# Patient Record
Sex: Female | Born: 1950
Health system: Southern US, Community
[De-identification: ages and names within clinical notes are randomized; demographics above are authoritative.]

## PROBLEM LIST (undated history)

## (undated) DIAGNOSIS — F329 Major depressive disorder, single episode, unspecified: Secondary | ICD-10-CM

## (undated) DIAGNOSIS — F32A Depression, unspecified: Secondary | ICD-10-CM

## (undated) DIAGNOSIS — F988 Other specified behavioral and emotional disorders with onset usually occurring in childhood and adolescence: Secondary | ICD-10-CM

## (undated) DIAGNOSIS — C4492 Squamous cell carcinoma of skin, unspecified: Secondary | ICD-10-CM

## (undated) DIAGNOSIS — Z8719 Personal history of other diseases of the digestive system: Secondary | ICD-10-CM

## (undated) DIAGNOSIS — F419 Anxiety disorder, unspecified: Secondary | ICD-10-CM

## (undated) DIAGNOSIS — K219 Gastro-esophageal reflux disease without esophagitis: Secondary | ICD-10-CM

## (undated) DIAGNOSIS — A64 Unspecified sexually transmitted disease: Secondary | ICD-10-CM

## (undated) DIAGNOSIS — K5790 Diverticulosis of intestine, part unspecified, without perforation or abscess without bleeding: Secondary | ICD-10-CM

## (undated) DIAGNOSIS — M1990 Unspecified osteoarthritis, unspecified site: Secondary | ICD-10-CM

## (undated) DIAGNOSIS — C801 Malignant (primary) neoplasm, unspecified: Secondary | ICD-10-CM

## (undated) DIAGNOSIS — F39 Unspecified mood [affective] disorder: Secondary | ICD-10-CM

## (undated) DIAGNOSIS — I10 Essential (primary) hypertension: Secondary | ICD-10-CM

## (undated) HISTORY — PX: PARTIAL COLECTOMY: SHX5273

## (undated) HISTORY — PX: PILONIDAL CYST EXCISION: SHX744

## (undated) HISTORY — PX: HEMORRHOID SURGERY: SHX153

## (undated) HISTORY — DX: Squamous cell carcinoma of skin, unspecified: C44.92

## (undated) HISTORY — PX: PLACEMENT OF BREAST IMPLANTS: SHX6334

## (undated) HISTORY — DX: Major depressive disorder, single episode, unspecified: F32.9

## (undated) HISTORY — DX: Depression, unspecified: F32.A

## (undated) HISTORY — PX: PARTIAL HYSTERECTOMY: SHX80

## (undated) HISTORY — DX: Unspecified osteoarthritis, unspecified site: M19.90

## (undated) HISTORY — PX: ABDOMINAL HYSTERECTOMY: SHX81

## (undated) HISTORY — DX: Diverticulosis of intestine, part unspecified, without perforation or abscess without bleeding: K57.90

## (undated) HISTORY — DX: Anxiety disorder, unspecified: F41.9

## (undated) HISTORY — DX: Gastro-esophageal reflux disease without esophagitis: K21.9

## (undated) HISTORY — PX: SHOULDER SURGERY: SHX246

## (undated) HISTORY — PX: RHINOPLASTY: SUR1284

## (undated) HISTORY — DX: Other specified behavioral and emotional disorders with onset usually occurring in childhood and adolescence: F98.8

## (undated) HISTORY — PX: COLON SURGERY: SHX602

## (undated) HISTORY — PX: BREAST IMPLANT REMOVAL: SUR1101

## (undated) HISTORY — DX: Unspecified sexually transmitted disease: A64

## (undated) HISTORY — DX: Unspecified mood (affective) disorder: F39

---

## 1999-08-11 ENCOUNTER — Encounter: Admission: RE | Admit: 1999-08-11 | Discharge: 1999-08-11 | Payer: Self-pay | Admitting: Family Medicine

## 1999-08-11 ENCOUNTER — Encounter: Payer: Self-pay | Admitting: Family Medicine

## 2000-01-21 ENCOUNTER — Encounter: Payer: Self-pay | Admitting: Orthopedic Surgery

## 2000-01-26 ENCOUNTER — Inpatient Hospital Stay (HOSPITAL_COMMUNITY): Admission: EM | Admit: 2000-01-26 | Discharge: 2000-01-27 | Payer: Self-pay | Admitting: Orthopedic Surgery

## 2001-04-24 ENCOUNTER — Encounter: Payer: Self-pay | Admitting: Family Medicine

## 2001-04-24 ENCOUNTER — Encounter: Admission: RE | Admit: 2001-04-24 | Discharge: 2001-04-24 | Payer: Self-pay | Admitting: Family Medicine

## 2002-04-13 ENCOUNTER — Encounter: Payer: Self-pay | Admitting: Family Medicine

## 2002-04-13 ENCOUNTER — Encounter: Admission: RE | Admit: 2002-04-13 | Discharge: 2002-04-13 | Payer: Self-pay | Admitting: Family Medicine

## 2002-08-15 ENCOUNTER — Encounter: Payer: Self-pay | Admitting: Family Medicine

## 2002-08-15 ENCOUNTER — Encounter: Admission: RE | Admit: 2002-08-15 | Discharge: 2002-08-15 | Payer: Self-pay | Admitting: Family Medicine

## 2004-04-21 ENCOUNTER — Ambulatory Visit: Payer: Self-pay | Admitting: Family Medicine

## 2004-05-07 ENCOUNTER — Ambulatory Visit: Payer: Self-pay | Admitting: Family Medicine

## 2004-07-08 ENCOUNTER — Ambulatory Visit: Payer: Self-pay | Admitting: Family Medicine

## 2004-09-18 ENCOUNTER — Ambulatory Visit: Payer: Self-pay | Admitting: Family Medicine

## 2004-10-26 ENCOUNTER — Ambulatory Visit: Payer: Self-pay | Admitting: Family Medicine

## 2005-04-27 ENCOUNTER — Encounter: Admission: RE | Admit: 2005-04-27 | Discharge: 2005-04-27 | Payer: Self-pay | Admitting: Orthopedic Surgery

## 2005-05-11 ENCOUNTER — Ambulatory Visit: Payer: Self-pay | Admitting: Family Medicine

## 2005-07-12 ENCOUNTER — Ambulatory Visit: Payer: Self-pay | Admitting: Internal Medicine

## 2005-07-15 ENCOUNTER — Ambulatory Visit: Payer: Self-pay | Admitting: Internal Medicine

## 2005-08-03 ENCOUNTER — Ambulatory Visit: Payer: Self-pay | Admitting: Family Medicine

## 2005-08-06 ENCOUNTER — Ambulatory Visit: Payer: Self-pay | Admitting: Family Medicine

## 2005-09-08 ENCOUNTER — Encounter: Admission: RE | Admit: 2005-09-08 | Discharge: 2005-09-08 | Payer: Self-pay | Admitting: Family Medicine

## 2005-10-05 ENCOUNTER — Ambulatory Visit: Payer: Self-pay | Admitting: Family Medicine

## 2005-10-28 ENCOUNTER — Ambulatory Visit: Payer: Self-pay | Admitting: Family Medicine

## 2006-02-07 ENCOUNTER — Ambulatory Visit: Payer: Self-pay | Admitting: Family Medicine

## 2006-02-08 ENCOUNTER — Encounter: Admission: RE | Admit: 2006-02-08 | Discharge: 2006-02-08 | Payer: Self-pay | Admitting: Family Medicine

## 2006-07-14 ENCOUNTER — Encounter: Payer: Self-pay | Admitting: Family Medicine

## 2006-07-14 DIAGNOSIS — I10 Essential (primary) hypertension: Secondary | ICD-10-CM

## 2006-07-14 DIAGNOSIS — K5730 Diverticulosis of large intestine without perforation or abscess without bleeding: Secondary | ICD-10-CM | POA: Insufficient documentation

## 2006-08-04 ENCOUNTER — Ambulatory Visit: Payer: Self-pay | Admitting: Family Medicine

## 2006-08-04 LAB — CONVERTED CEMR LAB
ALT: 26 units/L (ref 0–35)
Albumin: 4 g/dL (ref 3.5–5.2)
Alkaline Phosphatase: 103 units/L (ref 39–117)
BUN: 17 mg/dL (ref 6–23)
Basophils Absolute: 0 10*3/uL (ref 0.0–0.1)
Calcium: 9.7 mg/dL (ref 8.4–10.5)
Cholesterol: 253 mg/dL (ref 0–200)
Creatinine, Ser: 0.7 mg/dL (ref 0.4–1.2)
HDL: 62.2 mg/dL (ref >39.0)
Hemoglobin: 14 g/dL (ref 12.0–15.0)
MCHC: 35.4 g/dL (ref 30.0–36.0)
Monocytes Absolute: 0.3 10*3/uL (ref 0.2–0.7)
Monocytes Relative: 5.6 % (ref 3.0–11.0)
Platelets: 216 10*3/uL (ref 150–400)
Potassium: 3.9 meq/L (ref 3.5–5.1)
RBC: 4.43 M/uL (ref 3.87–5.11)
RDW: 12.3 % (ref 11.5–14.6)
TSH: 4.1 microintl units/mL (ref 0.35–5.50)
Total Bilirubin: 0.8 mg/dL (ref 0.3–1.2)
Total CHOL/HDL Ratio: 4.1
Triglycerides: 152 mg/dL — ABNORMAL HIGH (ref 0–149)
VLDL: 30 mg/dL (ref 0–40)

## 2006-08-11 ENCOUNTER — Ambulatory Visit: Payer: Self-pay | Admitting: Family Medicine

## 2006-08-11 DIAGNOSIS — F17200 Nicotine dependence, unspecified, uncomplicated: Secondary | ICD-10-CM | POA: Insufficient documentation

## 2006-09-28 ENCOUNTER — Encounter: Payer: Self-pay | Admitting: Family Medicine

## 2006-10-05 ENCOUNTER — Ambulatory Visit: Payer: Self-pay | Admitting: Internal Medicine

## 2006-10-10 ENCOUNTER — Ambulatory Visit: Payer: Self-pay | Admitting: Family Medicine

## 2006-10-12 ENCOUNTER — Ambulatory Visit: Payer: Self-pay | Admitting: Family Medicine

## 2007-03-17 ENCOUNTER — Ambulatory Visit: Payer: Self-pay | Admitting: Internal Medicine

## 2007-03-17 ENCOUNTER — Inpatient Hospital Stay (HOSPITAL_COMMUNITY): Admission: EM | Admit: 2007-03-17 | Discharge: 2007-03-21 | Payer: Self-pay | Admitting: Emergency Medicine

## 2007-03-28 ENCOUNTER — Telehealth: Payer: Self-pay | Admitting: Family Medicine

## 2007-04-06 ENCOUNTER — Ambulatory Visit: Payer: Self-pay | Admitting: Family Medicine

## 2007-04-27 ENCOUNTER — Ambulatory Visit: Payer: Self-pay | Admitting: Internal Medicine

## 2007-05-23 ENCOUNTER — Ambulatory Visit (HOSPITAL_COMMUNITY): Admission: RE | Admit: 2007-05-23 | Discharge: 2007-05-23 | Payer: Self-pay | Admitting: Internal Medicine

## 2007-05-23 ENCOUNTER — Encounter: Payer: Self-pay | Admitting: Internal Medicine

## 2007-06-01 ENCOUNTER — Ambulatory Visit: Payer: Self-pay | Admitting: Internal Medicine

## 2007-06-02 ENCOUNTER — Encounter: Admission: RE | Admit: 2007-06-02 | Discharge: 2007-06-02 | Payer: Self-pay | Admitting: General Surgery

## 2007-06-06 ENCOUNTER — Encounter (INDEPENDENT_AMBULATORY_CARE_PROVIDER_SITE_OTHER): Payer: Self-pay | Admitting: General Surgery

## 2007-06-06 ENCOUNTER — Inpatient Hospital Stay (HOSPITAL_COMMUNITY): Admission: RE | Admit: 2007-06-06 | Discharge: 2007-06-10 | Payer: Self-pay | Admitting: General Surgery

## 2007-06-14 ENCOUNTER — Encounter: Admission: RE | Admit: 2007-06-14 | Discharge: 2007-06-14 | Payer: Self-pay | Admitting: General Surgery

## 2007-06-21 ENCOUNTER — Encounter: Admission: RE | Admit: 2007-06-21 | Discharge: 2007-06-21 | Payer: Self-pay | Admitting: General Surgery

## 2007-08-02 ENCOUNTER — Encounter: Admission: RE | Admit: 2007-08-02 | Discharge: 2007-08-02 | Payer: Self-pay | Admitting: Family Medicine

## 2007-09-26 ENCOUNTER — Ambulatory Visit: Payer: Self-pay | Admitting: Family Medicine

## 2007-09-26 LAB — CONVERTED CEMR LAB
AST: 20 units/L (ref 0–37)
Alkaline Phosphatase: 123 units/L — ABNORMAL HIGH (ref 39–117)
Basophils Absolute: 0 10*3/uL (ref 0.0–0.1)
Blood in Urine, dipstick: NEGATIVE
Chloride: 107 meq/L (ref 96–112)
Creatinine, Ser: 0.7 mg/dL (ref 0.4–1.2)
Direct LDL: 165 mg/dL
Eosinophils Absolute: 0.1 10*3/uL (ref 0.0–0.7)
GFR calc non Af Amer: 91 mL/min
Glucose, Urine, Semiquant: NEGATIVE
HDL: 50.4 mg/dL (ref >39.0)
MCHC: 34.5 g/dL (ref 30.0–36.0)
MCV: 87.1 fL (ref 78.0–100.0)
Neutrophils Relative %: 49.2 % (ref 43.0–77.0)
Nitrite: NEGATIVE
Platelets: 193 10*3/uL (ref 150–400)
Potassium: 4.2 meq/L (ref 3.5–5.1)
Specific Gravity, Urine: 1.02
Total Bilirubin: 0.8 mg/dL (ref 0.3–1.2)
Triglycerides: 229 mg/dL (ref 0–149)
pH: 5.5

## 2007-10-03 ENCOUNTER — Ambulatory Visit: Payer: Self-pay | Admitting: Family Medicine

## 2007-11-20 ENCOUNTER — Encounter: Admission: RE | Admit: 2007-11-20 | Discharge: 2007-11-20 | Payer: Self-pay | Admitting: General Surgery

## 2008-02-10 ENCOUNTER — Emergency Department (HOSPITAL_COMMUNITY): Admission: EM | Admit: 2008-02-10 | Discharge: 2008-02-10 | Payer: Self-pay | Admitting: Emergency Medicine

## 2008-08-05 ENCOUNTER — Encounter: Admission: RE | Admit: 2008-08-05 | Discharge: 2008-08-05 | Payer: Self-pay | Admitting: Family Medicine

## 2008-09-19 ENCOUNTER — Ambulatory Visit: Payer: Self-pay | Admitting: Family Medicine

## 2008-09-19 LAB — CONVERTED CEMR LAB
Alkaline Phosphatase: 96 units/L (ref 39–117)
BUN: 21 mg/dL (ref 6–23)
Basophils Absolute: 0 10*3/uL (ref 0.0–0.1)
Bilirubin, Direct: 0 mg/dL (ref 0.0–0.3)
CO2: 30 meq/L (ref 19–32)
Calcium: 10 mg/dL (ref 8.4–10.5)
Cholesterol: 296 mg/dL — ABNORMAL HIGH (ref 0–200)
Creatinine, Ser: 0.9 mg/dL (ref 0.4–1.2)
Direct LDL: 210 mg/dL
Eosinophils Absolute: 0.1 10*3/uL (ref 0.0–0.7)
Glucose, Bld: 108 mg/dL — ABNORMAL HIGH (ref 70–99)
Lymphocytes Relative: 35.9 % (ref 12.0–46.0)
MCHC: 34.7 g/dL (ref 30.0–36.0)
Neutrophils Relative %: 54.4 % (ref 43.0–77.0)
Platelets: 194 10*3/uL (ref 150.0–400.0)
RBC: 4.57 M/uL (ref 3.87–5.11)
RDW: 12.6 % (ref 11.5–14.6)
Total Bilirubin: 0.9 mg/dL (ref 0.3–1.2)
Total CHOL/HDL Ratio: 6
Triglycerides: 192 mg/dL — ABNORMAL HIGH (ref 0.0–149.0)
Urobilinogen, UA: 0.2
pH: 6

## 2008-10-03 ENCOUNTER — Ambulatory Visit: Payer: Self-pay | Admitting: Family Medicine

## 2008-10-03 DIAGNOSIS — F988 Other specified behavioral and emotional disorders with onset usually occurring in childhood and adolescence: Secondary | ICD-10-CM

## 2008-12-26 ENCOUNTER — Ambulatory Visit: Payer: Self-pay | Admitting: Family Medicine

## 2008-12-26 DIAGNOSIS — E7800 Pure hypercholesterolemia, unspecified: Secondary | ICD-10-CM | POA: Insufficient documentation

## 2008-12-26 LAB — CONVERTED CEMR LAB
Albumin: 4 g/dL (ref 3.5–5.2)
Cholesterol: 245 mg/dL — ABNORMAL HIGH (ref 0–200)
Direct LDL: 167.7 mg/dL
Total CHOL/HDL Ratio: 4
Total Protein: 7 g/dL (ref 6.0–8.3)
Triglycerides: 152 mg/dL — ABNORMAL HIGH (ref 0.0–149.0)
VLDL: 30.4 mg/dL (ref 0.0–40.0)

## 2009-01-09 ENCOUNTER — Ambulatory Visit: Payer: Self-pay | Admitting: Family Medicine

## 2009-05-19 ENCOUNTER — Telehealth: Payer: Self-pay | Admitting: Family Medicine

## 2009-11-18 ENCOUNTER — Ambulatory Visit: Payer: Self-pay | Admitting: Family Medicine

## 2009-11-18 LAB — CONVERTED CEMR LAB
Albumin: 4.2 g/dL (ref 3.5–5.2)
Alkaline Phosphatase: 97 units/L (ref 39–117)
Basophils Absolute: 0 10*3/uL (ref 0.0–0.1)
Basophils Relative: 0.7 % (ref 0.0–3.0)
Bilirubin Urine: NEGATIVE
CO2: 29 meq/L (ref 19–32)
Calcium: 9.7 mg/dL (ref 8.4–10.5)
Chloride: 105 meq/L (ref 96–112)
Direct LDL: 184.8 mg/dL
Eosinophils Absolute: 0.1 10*3/uL (ref 0.0–0.7)
Glucose, Bld: 94 mg/dL (ref 70–99)
HCT: 39.7 % (ref 36.0–46.0)
Hemoglobin: 13.8 g/dL (ref 12.0–15.0)
Ketones, urine, test strip: NEGATIVE
Lymphocytes Relative: 45.5 % (ref 12.0–46.0)
Lymphs Abs: 2 10*3/uL (ref 0.7–4.0)
MCHC: 34.8 g/dL (ref 30.0–36.0)
MCV: 89.2 fL (ref 78.0–100.0)
Monocytes Absolute: 0.3 10*3/uL (ref 0.1–1.0)
Neutro Abs: 2 10*3/uL (ref 1.4–7.7)
Nitrite: NEGATIVE
RBC: 4.45 M/uL (ref 3.87–5.11)
RDW: 12.7 % (ref 11.5–14.6)
Sodium: 141 meq/L (ref 135–145)
Specific Gravity, Urine: 1.02
TSH: 1.2 microintl units/mL (ref 0.35–5.50)
Total CHOL/HDL Ratio: 4
Total Protein: 7.1 g/dL (ref 6.0–8.3)
Triglycerides: 125 mg/dL (ref 0.0–149.0)

## 2009-11-25 ENCOUNTER — Ambulatory Visit: Payer: Self-pay | Admitting: Family Medicine

## 2009-11-25 ENCOUNTER — Encounter: Payer: Self-pay | Admitting: Family Medicine

## 2010-02-17 NOTE — Assessment & Plan Note (Signed)
Summary: CPX//SLM   Vital Signs:  Patient profile:   60 year old female Menstrual status:  hysterectomy Height:      59.75 inches Weight:      131 pounds BMI:     25.89 Temp:     98.9 degrees F oral BP sitting:   130 / 94  (left arm) Cuff size:   regular  Vitals Entered By: Kern Reap CMA Duncan Dull) (November 25, 2009 1:49 PM)  History of Present Illness: Diana [name]. Is a 60 year old, married female, nonsmoker, who comes in today for general physical examination because of a history of hyperlipidemia, hypertension.  We have tried to manage her hyperlipidemia.  She's tried diet exercise is not affect her numbers.  We tried Zetia, statins even decrease the dose to 10 mg twice weekly, and she still couldn't tolerate them.  They cause severe muscle and joint pain.  Total cholesterol 284, LDL 184.  Her last really with diet and exercise, which is currently doing.  She also takes amphetamines, 10 mg daily because of ADD.  She takes hydrochlorothiazide 12.5 mg daily for hypertension.  BP is elevated 130/94.  She states at home.  Her blood pressures were also elevated.  We will therefore increase her dose to 25 mg daily.  BP checked daily for 4 weeks.  Follow-up if blood pressure is not dropped and normal.  She also takes potassium 20 mEq daily, Prilosec, OTC 20 mg daily, fish oil, calcium and vitamin D.  She is requesting a prescription for Premarin vaginal cream.  She gets routine eye care, dental care, BSE monthly, off on her mammograms because of no insurance.  Advised to go ahead and get a mammogram.  Colonoscopy normal.  Tetanus 2005 two shot 2011 Pneumovax 2009.  She continues to have a lot of trouble not only vaginal dryness, but hot flushes, and sleep dysfunction.  Discussed options.  She would like to take a low dose Premarin, .3, nightly  Allergies: 1)  ! Morphine Sulfate Cr (Morphine Sulfate) 2)  ! Zetia (Ezetimibe)  Past History:  Past medical, surgical, family and social histories  (including risk factors) reviewed, and no changes noted (except as noted below).  Past Medical History: Reviewed history from 04/06/2007 and no changes required. HRT MHA MOOD DISORDER Hypertension ADD Diverticulosis, colon DJD perforated diverticuli 2009, partial colectomy Dr. Freida Busman  Past Surgical History: Reviewed history from 10/03/2008 and no changes required. TAH -ENDOMETRIOSIS Hemorrhoidectomy PILONIDAL CYST RHINOPLASTYX2 BREAST IMPLANTS  IMPLANTS REMOVED-RUPTURE COMPRESSION FRACTURE LUMBAR VERT. R SHOULDER SURG  HYPOKALEMIA- 12/05 ruptured diverticulum, June 2010, partial colectomy sigmoid colon  Family History: Reviewed history from 10/03/2008 and no changes required. mother died of metastatic melanoma  Social History: Reviewed history from 10/03/2008 and no changes required. Current Smoker the patient is smoking half a pack of cigarettes a day.  She would like to quit.  A counseling session will ensue.  She also is noted above is moved out of her home is living with her sister because of the, marital problems.  She is having with her husband. Married Alcohol use-no Drug use-no  quit 2007 Regular exercise-yes Former Smoker  Review of Systems      See HPI  Physical Exam  General:  Well-developed,well-nourished,in no acute distress; alert,appropriate and cooperative throughout examination Head:  Normocephalic and atraumatic without obvious abnormalities. No apparent alopecia or balding. Eyes:  No corneal or conjunctival inflammation noted. EOMI. Perrla. Funduscopic exam benign, without hemorrhages, exudates or papilledema. Vision grossly normal. Ears:  External  ear exam shows no significant lesions or deformities.  Otoscopic examination reveals clear canals, tympanic membranes are intact bilaterally without bulging, retraction, inflammation or discharge. Hearing is grossly normal bilaterally. Nose:  External nasal examination shows no deformity or inflammation.  Nasal mucosa are pink and moist without lesions or exudates. Mouth:  Oral mucosa and oropharynx without lesions or exudates.  Teeth in good repair. Neck:  No deformities, masses, or tenderness noted. Chest Wall:  No deformities, masses, or tenderness noted. Breasts:  No mass, nodules, thickening, tenderness, bulging, retraction, inflamation, nipple discharge or skin changes noted.   Lungs:  Normal respiratory effort, chest expands symmetrically. Lungs are clear to auscultation, no crackles or wheezes. Heart:  Normal rate and regular rhythm. S1 and S2 normal without gallop, murmur, click, rub or other extra sounds. Abdomen:  Bowel sounds positive,abdomen soft and non-tender without masses, organomegaly or hernias noted. Rectal:  No external abnormalities noted. Normal sphincter tone. No rectal masses or tenderness. Genitalia:  Pelvic Exam:        External: normal female genitalia without lesions or masses        Vagina: normal without lesions or masses        Cervix: normal without lesions or masses        Adnexa: normal bimanual exam without masses or fullness        Uterus: normal by palpation        Pap smear: not performed Msk:  No deformity or scoliosis noted of thoracic or lumbar spine.   Pulses:  R and L carotid,radial,femoral,dorsalis pedis and posterior tibial pulses are full and equal bilaterally Extremities:  No clubbing, cyanosis, edema, or deformity noted with normal full range of motion of all joints.   Neurologic:  No cranial nerve deficits noted. Station and gait are normal. Plantar reflexes are down-going bilaterally. DTRs are symmetrical throughout. Sensory, motor and coordinative functions appear intact. Skin:  Intact without suspicious lesions or rashes Cervical Nodes:  No lymphadenopathy noted Axillary Nodes:  No palpable lymphadenopathy Inguinal Nodes:  No significant adenopathy Psych:  Cognition and judgment appear intact. Alert and cooperative with normal attention span  and concentration. No apparent delusions, illusions, hallucinations   Impression & Recommendations:  Problem # 1:  PURE HYPERCHOLESTEROLEMIA (ICD-272.0) Assessment Unchanged  The following medications were removed from the medication list:    Zocor 10 Mg Tabs (Simvastatin) .Marland Kitchen... 1 tab @ bedtime  Orders: EKG w/ Interpretation (93000)  Problem # 2:  ATTENTION DEFICIT DISORDER, ADULT (ICD-314.00) Assessment: Unchanged  Problem # 3:  HYPERTENSION (ICD-401.9) Assessment: Deteriorated  The following medications were removed from the medication list:    Hydrochlorothiazide 12.5 Mg Caps (Hydrochlorothiazide) .Marland Kitchen... Take one tab once daily Her updated medication list for this problem includes:    Maxzide-25 37.5-25 Mg Tabs (Triamterene-hctz) .Marland Kitchen... Take 1 tablet by mouth every morning  Orders: EKG w/ Interpretation (93000)  Problem # 4:  Preventive Health Care (ICD-V70.0) Assessment: Unchanged  Complete Medication List: 1)  Prilosec Otc 20 Mg Tbec (Omeprazole magnesium) .... Take 1 tablet by mouth twice a day prn 2)  Fish Oil Concentrate 1000 Mg Caps (Omega-3 fatty acids) .... Once daily 3)  Metamucil Plus Calcium Caps (Psyllium-calcium) .... Two times a day 4)  Eql Coq10 300 Mg Caps (Coenzyme q10) .... Take one tab by mouth once daily 5)  Adderall 10 Mg Tabs (Amphetamine-dextroamphetamine) .... Take one tab by mouth three times a day 6)  Magnesium Oxide 400 Mg Caps (Magnesium oxide) .... Once daily 7)  Premarin 0.625 Mg/gm Crea (Estrogens, conjugated) .... Uad 8)  Maxzide-25 37.5-25 Mg Tabs (Triamterene-hctz) .... Take 1 tablet by mouth every morning 9)  Premarin 0.3 Mg Tabs (Estrogens conjugated) .Marland Kitchen.. 1 tab @ bedtime  Patient Instructions: 1)  increase the hydrochlorothiazide to 25 mg daily.  Check your blood pressure daily in the morning.  If after 4 weeks.  Her blood pressure is not normal.  Return to see me with the data and the device.  Normal is 135/85 or less. 2)  Use small  amounts of Premarin vaginal cream twice weekly. 3)  It is important that you exercise regularly at least 20 minutes 5 times a week. If you develop chest pain, have severe difficulty breathing, or feel very tired , stop exercising immediately and seek medical attention. 4)  Schedule your mammogram. 5)  Take calcium +Vitamin D daily. 6)  Take an Aspirin every day...Marland KitchenMarland Kitchen81 mg  7)  Please schedule a follow-up appointment in 1 year. Prescriptions: PREMARIN 0.3 MG TABS (ESTROGENS CONJUGATED) 1 tab @ bedtime  #100 x 3   Entered and Authorized by:   Roderick Pee MD   Signed by:   Roderick Pee MD on 11/25/2009   Method used:   Print then Give to Patient   RxID:   [id number] ADDERALL 10 MG TABS (AMPHETAMINE-DEXTROAMPHETAMINE) take one tab by mouth three times a day  #100 x 0   Entered and Authorized by:   Roderick Pee MD   Signed by:   Roderick Pee MD on 11/25/2009   Method used:   Print then Give to Patient   RxID:   [id number] PRILOSEC OTC 20 MG TBEC (OMEPRAZOLE MAGNESIUM) Take 1 tablet by mouth twice a day prn  #100 x 3   Entered and Authorized by:   Roderick Pee MD   Signed by:   Roderick Pee MD on 11/25/2009   Method used:   Print then Give to Patient   RxID:   [id number] MAXZIDE-25 37.5-25 MG TABS (TRIAMTERENE-HCTZ) Take 1 tablet by mouth every morning  #100 x 3   Entered and Authorized by:   Roderick Pee MD   Signed by:   Roderick Pee MD on 11/25/2009   Method used:   Print then Give to Patient   RxID:   [id number] PREMARIN 0.625 MG/GM CREA (ESTROGENS, CONJUGATED) UAD  #3 tubes x 4   Entered and Authorized by:   Roderick Pee MD   Signed by:   Roderick Pee MD on 11/25/2009   Method used:   Print then Give to Patient   RxID:   [id number]    Orders Added: 1)  Est. Patient 40-64 years [16010] 2)  EKG w/ Interpretation [93000]

## 2010-02-17 NOTE — Progress Notes (Signed)
Summary: Pt has question re: Diagnosis issues  Phone Note Call from Patient Call back at Home Phone [phone]   Caller: Patient Summary of Call: Pt called and said that she had applied for medical insurance and filled in a medical records release form. When the records were rcvd, it showed pt had hyperlipdemia and hypertension. The insurance company has now increased her premium because of this. Pt says she is on lowest dose on HCTZ and Potassium Chloride and Simvastatin. Pt wants to discuss. Pls call asap.  Initial call taken by: Lucy Antigua,  May 19, 2009 5:05 PM  Follow-up for Phone Call        Phone Call Completed Follow-up by: Roderick Pee MD,  May 20, 2009 8:31 AM

## 2010-04-10 ENCOUNTER — Other Ambulatory Visit: Payer: Self-pay | Admitting: Family Medicine

## 2010-04-10 DIAGNOSIS — Z1231 Encounter for screening mammogram for malignant neoplasm of breast: Secondary | ICD-10-CM

## 2010-04-22 ENCOUNTER — Other Ambulatory Visit: Payer: Self-pay | Admitting: Dermatology

## 2010-04-22 DIAGNOSIS — C4492 Squamous cell carcinoma of skin, unspecified: Secondary | ICD-10-CM

## 2010-04-22 HISTORY — DX: Squamous cell carcinoma of skin, unspecified: C44.92

## 2010-04-23 ENCOUNTER — Ambulatory Visit
Admission: RE | Admit: 2010-04-23 | Discharge: 2010-04-23 | Disposition: A | Discharge status: Home / Self Care | Payer: PRIVATE HEALTH INSURANCE | Source: Ambulatory Visit | Attending: Family Medicine | Admitting: Family Medicine

## 2010-04-23 DIAGNOSIS — Z1231 Encounter for screening mammogram for malignant neoplasm of breast: Secondary | ICD-10-CM

## 2010-05-04 LAB — URINALYSIS, ROUTINE W REFLEX MICROSCOPIC
Bilirubin Urine: NEGATIVE
Hgb urine dipstick: NEGATIVE
Ketones, ur: NEGATIVE mg/dL
Specific Gravity, Urine: 1.023 (ref 1.005–1.030)
pH: 8 (ref 5.0–8.0)

## 2010-05-04 LAB — COMPREHENSIVE METABOLIC PANEL
ALT: 37 U/L — ABNORMAL HIGH (ref 0–35)
AST: 34 U/L (ref 0–37)
Albumin: 4.6 g/dL (ref 3.5–5.2)
CO2: 27 mEq/L (ref 19–32)
Chloride: 96 mEq/L (ref 96–112)
Creatinine, Ser: 0.71 mg/dL (ref 0.4–1.2)
GFR calc Af Amer: >60 mL/min (ref >60)
Sodium: 137 mEq/L (ref 135–145)
Total Bilirubin: 0.9 mg/dL (ref 0.3–1.2)

## 2010-05-04 LAB — CBC
MCV: 89.7 fL (ref 78.0–100.0)
Platelets: 200 10*3/uL (ref 150–400)
RBC: 4.75 MIL/uL (ref 3.87–5.11)
WBC: 6.1 10*3/uL (ref 4.0–10.5)

## 2010-05-04 LAB — DIFFERENTIAL
Basophils Absolute: 0 10*3/uL (ref 0.0–0.1)
Eosinophils Absolute: 0 10*3/uL (ref 0.0–0.7)
Eosinophils Relative: 1 % (ref 0–5)
Lymphocytes Relative: 27 % (ref 12–46)
Lymphs Abs: 1.6 10*3/uL (ref 0.7–4.0)
Monocytes Absolute: 0.3 10*3/uL (ref 0.1–1.0)

## 2010-06-02 NOTE — Consult Note (Signed)
Diana [name], ATOR NO.:  [id number]   MEDICAL RECORD NO.:  [id number]          PATIENT TYPE:  EMS   LOCATION:  ED                           FACILITY:  [facility]   PHYSICIAN:  Alfonse Ras, MD   DATE OF BIRTH:  1950/03/17   DATE OF CONSULTATION:  02/10/2008  DATE OF DISCHARGE:  02/10/2008                                 CONSULTATION   CHIEF COMPLAINT:  Status post sigmoid colectomy by Dr. Bertram Savin in  May 2009, now with some crampy abdominal pain, significantly improved  here in the emergency room.   HISTORY OF PRESENT ILLNESS:  The patient is a very pleasant 60 year old  white female, underwent elective sigmoid colectomy in May of last year,  who called me twice today while I was on-call with some vague upper  abdominal pain.  She came to [facility] Emergency Room and underwent  evaluation.  She feels much better here in the Emergency Room.  However,  CT scan shows some mildly dilated proximal small bowel, but no  transition point.  The patient is feeling much better.  Denies any  nausea or vomiting.   On exam she is afebrile.  Her labs are all within normal limits  including a white count of 6000.  On abdominal exam, she is soft and  nontender with normal bowel sounds.   IMPRESSION:  Early partial small bowel obstruction.  The patient is  going to stay here in [place].  She chooses to go home and just stay  on clear liquids for a day or two.  I have consulted her and her husband  at length about increasing abdominal pain, nausea, or vomiting, return  to the Emergency Room, but since she is feeling better, passing gas, and  has had 2 bowel movements here in the ER, I think she is best served at  home, but are readily available if she needs to contact us back.      Alfonse Ras, MD  Electronically Signed     KRE/MEDQ  D:  02/10/2008  T:  02/11/2008  Job:  [id number]

## 2010-06-02 NOTE — Assessment & Plan Note (Signed)
[facility]                                 ON-CALL NOTE   NAME:[name], Diana [name]                       MRN:          [id number]  DATE:03/27/2007                            DOB:          20-May-1950    Phone number [phone].  Regular doctor is Dr. Tawanna Cooler.  Dr. Milinda Antis on call.   CHIEF COMPLAINT:  Fever.  Patient said she was in the hospital last week  for a perforated colon and diverticulitis.  She was released from the  hospital to follow up with Dr. Tawanna Cooler on the 16th, and Dr. Freida Busman on the  23rd.  They are planning surgery for diverticulitis.  She was on Cipro  and Flagyl IV in the hospital, and now she is on it p.o.  She said she  has been feeling fairly good, slowly getting her strength back.  Her  bowel movements are normal.  She has occasional cramps in the abdomen  that comes and goes, but this evening she started having low-grade fever  of about 100.2.  She said she is not having chills, aches, or sweats,  but she does have very mild headache.  She denies sore throat, cough,  runny nose, or stuffy nose, and otherwise feels fine.  She said she is  not having abdominal pain at the current time, or any other symptoms.  I  told her to watch this closely tonight.  If her temperature goes up  above 101, I instructed her to call back and go to the emergency room.  Also, if she develops abdominal pain, nausea, vomiting, or any other  symptoms, to call back and go to the emergency room as well.  Otherwise,  she will call Dr. Nelida Meuse office in the morning, and get an appointment  to be seen early so she can get checked out, and I encouraged her to  call back if there are any changes tonight at all.     Marne A. Tower, MD  Electronically Signed    MAT/MedQ  DD: 03/27/2007  DT: 03/28/2007  Job #: [id number]   cc:   Tinnie Gens A. Tawanna Cooler, MD

## 2010-06-02 NOTE — Discharge Summary (Signed)
NAMEDERINDA, BARTUS              ACCOUNT NO.:  [id number]   MEDICAL RECORD NO.:  [id number]          PATIENT TYPE:  INP   LOCATION:  1410                         FACILITY:  [facility]   PHYSICIAN:  Valerie A. Felicity Coyer, MDDATE OF BIRTH:  11-11-1950   DATE OF ADMISSION:  03/16/2007  DATE OF DISCHARGE:  03/21/2007                               DISCHARGE SUMMARY   DATE OF ADMISSION:  March 17, 2007   DATE OF DISCHARGE:  March 21, 2007   DISCHARGE DIAGNOSES:  1. Sigmoid diverticulitis.  2. History of constipation  3. Mild anemia.   HISTORY OF PRESENT ILLNESS:  Ms. Poudrier is a 60 year old with past  medical history of hypertension who presented to the emergency room on  day of admission with 1-day history of left lower quadrant abdominal  pain radiating to her back with nausea. Upon evaluation in the ER, the  patient's CT scan of the abdomen revealed a perforated sigmoid  diverticulitis with no abscess and tiny amount of intraperitoneal free  air. The patient was admitted at that time for further evaluation and  treatment.   PAST MEDICAL HISTORY:  1. Hypertension.  2. Diverticulitis  3. Attention deficit disorder  4. Constipation.   COURSE OF HOSPITALIZATION:  1. Sigmoid diverticulitis with micro perforation. General surgery      asked to consult on this patient who recommended conservative      approach to perforation.  The patient was made n.p.o., IV Cipro and      Flagyl were started. There was no urgent need for surgery felt      necessary.  The patient did have impressive clinical response to IV      antibiotic treatment. The patient remained afebrile greater than 48      hours at time of discharge she was tolerating regular diet well      with positive bowel movements, no nausea, vomiting or abdominal      pain at time of discharge. The patient was discharged home with a      total of 10 days treatment on Cipro and Flagyl.  2. History of constipation.  Patient encouraged  to use Metamucil      b.i.d. per General Surgery.  3. Hypertension.  The patient's blood pressure remained stable      throughout hospitalization.  She is continue with home medications.  4. Mild anemia with no acute blood loss on exam at time of discharge      and with stable hemoglobin at time of discharge. The patient may      need outpatient anemia workup.   MEDICATIONS:  At time of discharge:  1. Cipro 500 mg p.o. b.i.d. until gone.  2. Flagyl 500 mg p.o. t.i.d. until gone.  3. Percocet 5/325 1-2 tablets p.o. q.4 h p.r.n.  4. Adderall 40 mg p.o. daily.  5. Hydrochlorothiazide 12.5 mg p.o. daily.  6. Potassium chloride 20 mEq p.o. daily.  7. Multivitamin p.o. daily.  8. Metamucil p.o. b.i.d.   PERTINENT LABORATORY AT TIME OF DISCHARGE:  White cell count 6.8,  platelet count 170, hemoglobin 11.0, hematocrit 30.7, sodium  140,  potassium 3.4, BUN 6, creatinine 0.69.  Blood cultures x2 were negative  for any growth. Urinalysis negative for any infection.   DISPOSITION:  The patient felt medically stable for discharge home with  close followup with primary care physician. In addition she was  instructed to follow up with Dr. Freida Busman, general surgeon, in 2-3 weeks  post discharge. Dr. Freida Busman instructed the patient to follow up with Dr.  Tawanna Cooler 2 weeks prior to appointment with her in order to obtain clearance  for OR as the patient had previously intended to have surgery done with  Dr. Freida Busman; however, with acute diverticulitis surgery was held off to  give time for the patient to return to baseline.  The patient is  instructed to return the ER for any increased fever, abdominal pain,  nausea or vomiting.  She is asked to call the office with any questions.      Cordelia Pen, NP      Raenette Rover. Felicity Coyer, MD  Electronically Signed    LE/MEDQ  D:  03/31/2007  T:  04/01/2007  Job:  [id number]   cc:   Tinnie Gens A. Tawanna Cooler, MD  [address] [place]  [place] [address]

## 2010-06-02 NOTE — Op Note (Signed)
NAMETANNY, HARNACK NO.:  [id number]   MEDICAL RECORD NO.:  [id number]          PATIENT TYPE:  INP   LOCATION:  0005                         FACILITY:  [facility]   PHYSICIAN:  Lennie Muckle, MD      DATE OF BIRTH:  July 20, 1950   DATE OF PROCEDURE:  06/06/2007  DATE OF DISCHARGE:                               OPERATIVE REPORT   PREOPERATIVE DIAGNOSIS:  Previous perforated diverticulitis.   POSTOPERATIVE DIAGNOSIS:  Previous perforated diverticulitis.   PROCEDURE:  Laparoscopic sigmoid resection.   SURGEON:  Lennie Muckle, MD.   ASSISTANT:  Ardeth Sportsman, MD.   ANESTHESIA:  General endotracheal anesthesia.   FINDINGS:  Inflammatory reaction in the left lower quadrant to the  pelvic sidewall as well as to the small-bowel.   SPECIMEN:  Sigmoid.   ESTIMATED BLOOD LOSS:  Approximately 100 mL.   COMPLICATIONS:  No immediate complications.   DRAINS:  No drains were placed.   INDICATIONS FOR PROCEDURE:  Ms. Ostrosky is a 60 year old female who had  had a previous episode of perforated diverticulitis, was treated  successfully with IV antibiotics and had resolution of her pain and her  inflammatory process.  I had discussed with Ms. Fusco options of  either performing a resection of the area versus continued monitoring  with chance of a recurrence due to her onset and perforation, she chose  rather to have the resection.  Informed consent was obtained.   DESCRIPTION OF PROCEDURE:  Ms. Raben was identified in the  preoperative holding area.  She was given a gram of cefoxitin as well as  heparin subcutaneously and taken to the operating room.  Once in the  operating room, she was placed in the supine position.  After  administration of general endotracheal anesthesia, her abdomen was  prepped and draped in the usual sterile fashion.  A time-out procedure  indicating the patient and procedure were performed.  And using 5 dB a  #5 mm trocar was placed at the  supraumbilical site.  Anterior and  posterior rectus fascial layers were carefully identified.  Peritoneum  was then able to be established.  There was no evidence of injury upon  placement of the trocar.  I then placed two 5 mm trocars in the right  side of the abdomen.  An additional 5 mm trocar was placed in the left  side of the abdomen under visualization with the camera.  Upon  inspection of the abdomen, sigmoid was identified with an adhesions to  the left pelvic sidewall.  This was taken down with sharp dissection as  well as using a LigaSure device.  The sigmoid had a dense adhesion  inferiorly which was able to be carefully dissected without injuring the  sigmoid colon .  There was also a small-bowel adhesion to the sigmoid  which was taken successfully with sharp dissection.  I then chose an  area superiorly to the inflammatory reaction and scored the mesentery  with the electrocautery.  Using the LigaSure, I divided the mesentery  just beneath the descending colon.  A rent in the  mesentery was  obtained.  We then proceeded distally down to the pelvis.  We reflected  the colon medially and were able to identify the left ureter. I  continued with the mesenteric dissection towards the pelvis staying away  from the ureter.  We proceeded proximally and performed the lateral  dissection along the line of [place].  I chose an area well away from the  inflammatory mass and to encompass all the sigmoid. I then changed the 5  mm trocar in the right lower quadrant to a 12 mm trocar and used the  laparoscopic stapling device and distally transected across the distal  sigmoid.  I then chose an area superiorly which was free from the  inflammatory mass and had good supply and also stapled across with a  laparoscopic stapler.  We continued further mobilizing the descending  colon by dissecting the splenic flexure with the Ligasure.  It seemed as  the colon easily came down into the pelvis.  At  that time, I then placed  an incision between the umbilicus and suprapubic bone.  The sigmoid  colon was delivered through this wound and passed off the operative  field.  I then delivered the descending colon through the wound bed.  I  carefully dissected the surrounding fat around the staple line for  placement of the anvil into the descending colon. AFter removing the  staple line, sizers were used to measure for appropriate staple size.  A  33 sizer easily fit into the enterotomy.  The pursestring device was  then used to pass the [place] needle for placement of the anvil.  Once the  anvil was secured in place, the colon was delivered back into the  abdominal cavity.  The pelvis was irrigated.  There was no significant  bleeding noted.  Mild oozing just beneath the staple line which was  controlled with electrocautery.  At that time, Dr. Michaell Cowing then proceeded  to use dilators at the rectum.  Also sized this up to fit a 33 anvil  stapler.  The EEA stapler device was then placed through the rectum into  the rectal stump.  We then were able to deploy the anvil into the EEA  stapler successfully.  Once in the stapling device was deployed,  there  were two rings of tissue which were intact after stapling.  The abdomen  was irrigated and a rigid proctoscope was performed.  I occluded the  proximal portion of the colon.  There was no bubbling visualized within  the pelvis.  Dr. Michaell Cowing was able to visualize the anastomosis which was  noted at 14 cm.  The air was released.  The abdomen was once again  irrigated and no bleeding was noted.  I did elect placed Tisseel over  the staple line.  Using 10 mL of Tisseel, I placed this around the  staple line in the mesentery.  I closed the 12 mm port site with 0  Vicryl suture.  The small and midline incision was closed with  interrupted 1-0 Prolene suture.  Upon final inspection, the closures  appeared intact.  There was no bleeding or injury inside the  abdominal  cavity.  Pneumo insufflation released, trocars removed and the skin was  closed with 4-0 Monocryl.  The patient was then awoke and transported to  post anesthesia care unit in stable condition.  Dermabond was placed for  final dressing and approximately 30 amounts of local anesthetic was  injected into the incisions.  Lennie Muckle, MD  Electronically Signed     ALA/MEDQ  D:  06/06/2007  T:  06/06/2007  Job:  [id number]   cc:   Tinnie Gens A. Tawanna Cooler, MD  [address] [place]  [place] [address]

## 2010-06-02 NOTE — Consult Note (Signed)
NAMENOEL, HENANDEZ NO.:  [id number]   MEDICAL RECORD NO.:  [id number]          PATIENT TYPE:  INP   LOCATION:  0106                         FACILITY:  [facility]   PHYSICIAN:  Lennie Muckle, MD      DATE OF BIRTH:  [birth year]   DATE OF CONSULTATION:  03/17/2007  DATE OF DISCHARGE:                                 CONSULTATION   REASON FOR CONSULTATION:  Diverticulitis.   Ms. Murtagh is a 60 year old female, who apparently has had three  episodes now of diverticulitis.  She had one event in 2004 and had a  second episode of diverticulitis in 2007.  She states recently her pain  began approximately 7 o'clock yesterday on the 26th.  It was in the left  lower abdomen.  It was waxing and waning, was described as sharp,  stabbing pain.  There were no aggravating symptoms.  She states she  feels some better with receiving medicines within the emergency  department.  She has had no fever or chills at home, however, she did  have a fever of 101.7 in the emergency department.  She has had recent  bouts of nephrolithiasis and believes that this was related to that.  She has had no difficulty with voiding and has had no problems with  constipation.  She does take a stool softener.  Her last colonoscopy was  noted approximately eight years ago.  A CT scan did reveal severe  diverticulitis with marked inflammation in the sigmoid area.  There was  a question of a perforation, but no residual fluid and no free air.   PAST MEDICAL HISTORY:  1. ADD.  2. Hypertension.   PAST SURGICAL HISTORY:  1. C-section.  2. Hysterectomy.  3. Rotator cuff surgery.   MEDICATIONS:  1. Adderall 40 mg once a day.  2. Hydrochlorothiazide 12.5 mg once a day.  3. Potassium 20 mEq once a day.  4. Multivitamin.  5. Metamucil.   ALLERGIES:  1. MORPHINE causes nausea and vomiting.  2. DARVON causes severe headache.   REVIEW OF SYSTEMS:  Per the patient and the patient's emergency room  chart,  10-point system is negative.   PHYSICAL EXAMINATION:  GENERAL:  She is pleasant, lying on a stretcher,  does appear fatigued.  VITAL SIGNS:  Most recent blood pressure is 121/75, pulse was 94, and  temperature was 101.7.  That was timed at 11:39 a.m.  HEENT EXAM:  Extraocular muscles are intact, no scleral icterus is  noted, pupils are equal and round.  CHEST:  Clear to auscultation bilaterally.  CARDIOVASCULAR EXAM:  Regular rate and rhythm.  ABDOMEN:  Soft, there is a C-section scar at the pelvis.  The patient  does have tenderness with palpation in the left lower quadrant, but no  peritoneal signs.  EXTREMITIES:  Without deformity or edema.  NEUROLOGICAL EXAM:  Cranial nerves II-XII are grossly intact, no focal  deficits are noted.  PSYCHOLOGICAL EXAM:  Within normal limits.   LABORATORY DATA:  Serum chemistries:  Her glucose is mildly elevated at  140.  White count is normal at  10, hemoglobin and hematocrit 12.6 and  35, urinalysis is negative, CT as previously dictated.   ASSESSMENT AND PLAN:  Diverticulitis of the sigmoid colon with a marked  inflammation on computerized tomography scan, no free fluid.  I think  that Ms. Migliaccio would be a good candidate for conservative management  if she does clinically improve over the next couple of days with  intravenous antibiotics and bowel rest.  I have stressed to her if she  clinically worsens she would have to have emergency surgery and likely a  colostomy.  If she does clinically improve, will start a clear liquid  diet and advance her to a low residue diet as tolerated, change her from  intravenous narcotics to oral medications.  She does have a significant  amount of stool in her colon despite being on Metamucil.  I have placed  her on MiraLax, and she will likely need to have two stool softeners  once she goes home.  She is already started on Cipro and Flagyl here in  the emergency department and if that is continued on her  orders to the  floor, more than likely I think she will need to have a colonoscopy as  well.  She has had one in the past eight years, but I think it might be  beneficial to go ahead and repeat that prior to her surgery.  All  questions were answered, and I will follow along with her hospital  course.      Lennie Muckle, MD  Electronically Signed     ALA/MEDQ  D:  03/17/2007  T:  03/18/2007  Job:  [id number]

## 2010-06-02 NOTE — H&P (Signed)
NAME:  Diana [name], Diana [name] NO.:  [id number]   MEDICAL RECORD NO.:  [id number]          PATIENT TYPE:  EMS   LOCATION:  ED                           FACILITY:  [facility]   PHYSICIAN:  Therisa Doyne, MD    DATE OF BIRTH:  Apr 05, 1950   DATE OF ADMISSION:  03/16/2007  DATE OF DISCHARGE:                              HISTORY & PHYSICAL   SERVICE:  Architectural technologist.   PRIMARY CARE Rami Budhu:  Eugenio Hoes. Tawanna Cooler, MD.   CHIEF COMPLAINT:  Diverticulitis.   HISTORY OF PRESENT ILLNESS:  This is a 60 year old, white female with a  past medical history significant for diverticulitis and hypertension,  who presents with acute onset of abdominal pain.  The pain began at 6  p.m. this evening and was described as a sharp, intense pain.  It was  initially located in the left lower quadrant and then radiated to her  back.  She had associated nausea with this pain.  She denies any fever  or chills.  Because of these symptoms, she came into the emergency  department for evaluation.  In the emergency department, she received  Reglan, Cipro, Flagyl, and Dilaudid for treatment of sigmoid  diverticulitis.   REVIEW OF SYSTEMS:  All systems were reviewed and are negative except as  mentioned above in the HPI.   PAST MEDICAL HISTORY:  1. History of diverticulitis.  2. Hypertension.   SOCIAL HISTORY:  The patient lives at [facility].  She denies tobacco,  alcohol, or drugs.   FAMILY HISTORY:  Positive for diverticulitis.   ALLERGIES:  1. MORPHINE.  2. DARVOCET.   MEDICATIONS:  1. HCTZ 12.5 mg daily.  2. Calcium chloride 20 mEq daily.  3. Aspirin 81 mg daily.  4. Adderall-XR 40 mg daily.   PHYSICAL EXAMINATION:  VITAL SIGNS:  Temperature 98.5, blood pressure  149/99, pulse 84, respirations 20, oxygen saturation 100% on room air.  GENERAL:  In no acute distress.  HEENT:  Normocephalic, atraumatic, pupils are equally round and reactive  to light and accommodation, extraocular  movements are intact, oropharynx  pink and moist without any lesions.  NECK:  Supple, no lymphadenopathy, no jugulovenous distention, no  masses.  CARDIOVASCULAR:  Regular rate and rhythm, no murmurs, rub, or gallops.  CHEST:  Clear to auscultation bilaterally.  ABDOMEN:  Positive for bowel sounds, soft, tender to palpation in the  left lower quadrant, no rebound or guarding, nondistended.  EXTREMITIES:  No clubbing, cyanosis, or edema.  SKIN:  No rashes.  BACK:  No CVA tenderness.   CT scan of the abdomen showed a sigmoid diverticulitis with a  microperforation, no abscess, there is a tiny amount of free  intraperitoneal air.   ASSESSMENT AND PLAN:  1. We will admit the patient to [facility] to a      telemetry unit.   1. Sigmoid diverticulitis and microperforation.  The case was      discussed by the emergency department physician, Dr. Read Drivers, with      the on-call general surgeon.  General Surgery's initial      recommendations  were a conservative approach with bowel rest,      intravenous fluids, and antibiotics.  It was therefore thought that      with this conservative approach that the patient would decrease her      chances for needing a colostomy.  Because of this, we will      conservatively treat the patient with keeping her nothing per mouth      (NPO), normal saline intravenous fluids, intravenous Ciprofloxacin,      and intravenous Flagyl.  We will monitor with serial abdominal      examinations and have a General Surgery consultation follow in the      morning.   1. Continue Adderall.   1. Hypertension.  We will hold her HCTZ.   1. Fluids, electrolytes, and nutrition.  Normal saline at 125 ml per      hour, electrolytes are stable, nothing per mouth.   1. For deep venous thrombosis prophylaxis, Lovenox.      Therisa Doyne, MD  Electronically Signed     SJT/MEDQ  D:  03/17/2007  T:  03/17/2007  Job:  [id number]

## 2010-06-05 NOTE — Op Note (Signed)
[place]  Patient:    Diana [name], Diana [name]                     MRN: [id number] Adm. Date:  [id number] Attending:  Skip Mayer                           Operative Report  SURGEON:  Georges Lynch. Darrelyn Hillock, M.D.  ASSISTANT:  Dorie Rank, P.A.  PREOPERATIVE DIAGNOSES 1. Severe impingement syndrome, right shoulder. 2. Partial tear of rotator cuff tendon, right shoulder. 3. Chronic subdeltoid bursitis, right shoulder.  POSTOPERATIVE DIAGNOSES 1. Severe impingement syndrome, right shoulder. 2. Partial tear of rotator cuff tendon, right shoulder. 3. Chronic subdeltoid bursitis, right shoulder.  OPERATION 1. Partial acromionectomy on the right. 2. Excision of a chronic subdeltoid bursa that was chronically inflamed. 3. Repair of a partial tear of the rotator cuff tendon on the right.  Note:    There were no large defects in the tendon.  DESCRIPTION OF PROCEDURE:  Under general anesthesia, the patient first had an interscalen block, then she was given general anesthesia.  After the sterile prepping and draping were carried out, an incision was made over the anterior aspect of the right shoulder, bleeders identified and cauterized.  I then identified the deltoid tendon and separated it by sharp dissection from the acromion.  I then went down and identified a severe impingement-type syndrome. I then went down and identified the subdeltoid bursa after I stripped the deltoid tendon from the acromion.  She had a severely thickened, chronic subdeltoid bursitis.  I removed that after I did my partial acromionectomy. She had rather significant impingement syndrome; her tendon was literally worn like a cheese grater effect.  As I brought her up into abduction, I noticed the acromion was impinging on the tendon.  Once we did the acromionectomy, we had good open space.  I utilized the oscillating saw and then the bur; we now had a nice smooth surface in  regards to the undersurface of the acromion.  I then placed some sutures transversely across the defect.  In the deltoid, the long head of the biceps tendon really was not subluxating.  I could feel it nicely in the groove.  We would internally and externally rotate the arm and there really was no motion.  There were no gross tears of the subscapularis. I thoroughly irrigated out the area and closed the wound in layers in the usual fashion.  The skin was closed with metal staples.  A sterile Neosporin dressing was placed on the wound.  She was placed in a shoulder immobilizer. DD:  01/25/00 TD:  01/25/00 Job: [id number]

## 2010-06-05 NOTE — Discharge Summary (Signed)
NAMEHEAVEN, MEEKER NO.:  [id number]   MEDICAL RECORD NO.:  [id number]          PATIENT TYPE:  INP   LOCATION:  1534                         FACILITY:  [facility]   PHYSICIAN:  Lennie Muckle, MD      DATE OF BIRTH:  Jun 14, 1950   DATE OF ADMISSION:  06/06/2007  DATE OF DISCHARGE:  06/10/2007                               DISCHARGE SUMMARY   FINAL DIAGNOSIS:  Diverticulitis status post laparoscopic sigmoid  colectomy with anastomosis.   HOSPITAL COURSE:  Ms. Kilcrease is a 60 year old female admitted  postoperatively following her laparoscopic sigmoid resection.  She had  an uncomplicated hospital course and was able to be started on a liquid  diet the day after surgery and then was advanced to a regular diet by  postoperative day 2.  She was ambulating without difficulty and had no  amount of pain.  She was started on Dilaudid orally for pain on the 21st  and was discharged home with Dilaudid.  Her incisions were without  evidence of infection.  She had restrictions of no heavy lifting greater  than 20 pounds and she was instructed to follow up with me in  approximately two weeks.      Lennie Muckle, MD  Electronically Signed     ALA/MEDQ  D:  07/04/2007  T:  07/04/2007  Job:  [id number]   cc:   Tinnie Gens A. Tawanna Cooler, MD  [address] [place]  [place] [address]

## 2010-10-09 LAB — CBC
HCT: 35.8 — ABNORMAL LOW
MCHC: 35.1
MCV: 86.2
Platelets: 212
RDW: 13.2

## 2010-10-09 LAB — URINALYSIS, ROUTINE W REFLEX MICROSCOPIC
Bilirubin Urine: NEGATIVE
Glucose, UA: NEGATIVE
Hgb urine dipstick: NEGATIVE
Ketones, ur: NEGATIVE
Nitrite: NEGATIVE
pH: 7.5

## 2010-10-09 LAB — CULTURE, BLOOD (ROUTINE X 2): Culture: NO GROWTH

## 2010-10-09 LAB — DIFFERENTIAL
Basophils Absolute: 0
Eosinophils Absolute: 0.1
Eosinophils Relative: 1
Monocytes Absolute: 0.2

## 2010-10-09 LAB — BASIC METABOLIC PANEL
BUN: 23
CO2: 23
Chloride: 109
Glucose, Bld: 140 — ABNORMAL HIGH
Potassium: 3.7

## 2010-10-12 LAB — BASIC METABOLIC PANEL
BUN: 6
Calcium: 8.2 — ABNORMAL LOW
Creatinine, Ser: 0.69
GFR calc non Af Amer: >60
Glucose, Bld: 93
Potassium: 3.4 — ABNORMAL LOW

## 2010-10-12 LAB — CBC
Hemoglobin: 11 — ABNORMAL LOW
MCHC: 35.9
MCV: 85.9
RBC: 3.58 — ABNORMAL LOW

## 2010-10-12 LAB — DIFFERENTIAL
Basophils Relative: 0
Eosinophils Absolute: 0
Lymphs Abs: 0.9
Monocytes Absolute: 0.3
Monocytes Relative: 5
Neutro Abs: 5.5

## 2010-10-14 LAB — DIFFERENTIAL
Basophils Absolute: 0
Basophils Relative: 1
Eosinophils Absolute: 0.1
Eosinophils Relative: 2
Lymphocytes Relative: 50 — ABNORMAL HIGH
Monocytes Absolute: 0.3

## 2010-10-14 LAB — CBC
HCT: 38.7
MCHC: 34
Platelets: 202
RDW: 14.1

## 2010-10-14 LAB — BASIC METABOLIC PANEL
BUN: 13
CO2: 27
GFR calc non Af Amer: >60
Glucose, Bld: 109 — ABNORMAL HIGH
Potassium: 4.4

## 2010-12-21 ENCOUNTER — Other Ambulatory Visit: Payer: Self-pay | Admitting: Family Medicine

## 2010-12-28 ENCOUNTER — Other Ambulatory Visit: Payer: Self-pay | Admitting: Family Medicine

## 2011-03-05 ENCOUNTER — Ambulatory Visit (INDEPENDENT_AMBULATORY_CARE_PROVIDER_SITE_OTHER): Payer: PRIVATE HEALTH INSURANCE | Admitting: Internal Medicine

## 2011-03-05 VITALS — BP 132/87 | HR 74 | Temp 98.6°F | Resp 16 | Ht 59.75 in | Wt 133.6 lb

## 2011-03-05 DIAGNOSIS — J40 Bronchitis, not specified as acute or chronic: Secondary | ICD-10-CM

## 2011-03-05 MED ORDER — AZITHROMYCIN 250 MG PO TABS: ORAL_TABLET | ORAL | Status: AC

## 2011-03-05 MED ORDER — HYDROCODONE-ACETAMINOPHEN 7.5-500 MG/15ML PO SOLN: 5.0000 mL | Freq: Four times a day (QID) | ORAL | Status: AC | PRN

## 2011-03-05 NOTE — Progress Notes (Signed)
  Subjective:    Patient ID: Diana [name], female    DOB: Oct 18, 1950, 61 y.o.   MRN: [id number]  HPI  Cough,CP  Review of Systems     Objective:   Physical Exam  Constitutional: She is oriented to person, place, and time. She appears well-developed and well-nourished. No distress.  Eyes: EOM are normal.  Neck: Neck supple.  Cardiovascular: Normal rate and regular rhythm.   Pulmonary/Chest: Effort normal and breath sounds normal.  Neurological: She is alert and oriented to person, place, and time.  Skin: Skin is warm and dry.          Assessment & Plan:  Zpak Lortab prn Resp. care

## 2011-05-12 ENCOUNTER — Encounter: Payer: PRIVATE HEALTH INSURANCE | Admitting: Family Medicine

## 2011-05-21 ENCOUNTER — Other Ambulatory Visit: Payer: Self-pay | Admitting: Family Medicine

## 2011-05-21 DIAGNOSIS — Z1231 Encounter for screening mammogram for malignant neoplasm of breast: Secondary | ICD-10-CM

## 2011-06-02 ENCOUNTER — Ambulatory Visit: Payer: PRIVATE HEALTH INSURANCE

## 2011-06-04 ENCOUNTER — Ambulatory Visit
Admission: RE | Admit: 2011-06-04 | Discharge: 2011-06-04 | Disposition: A | Discharge status: Home / Self Care | Payer: PRIVATE HEALTH INSURANCE | Source: Ambulatory Visit | Attending: Family Medicine | Admitting: Family Medicine

## 2011-06-04 ENCOUNTER — Telehealth: Payer: Self-pay | Admitting: Family Medicine

## 2011-06-04 ENCOUNTER — Other Ambulatory Visit: Payer: Self-pay | Admitting: Family Medicine

## 2011-06-04 DIAGNOSIS — N6459 Other signs and symptoms in breast: Secondary | ICD-10-CM

## 2011-06-04 DIAGNOSIS — Z1231 Encounter for screening mammogram for malignant neoplasm of breast: Secondary | ICD-10-CM

## 2011-06-04 NOTE — Telephone Encounter (Signed)
Patient called stating that the breast center will be sending over a form to request more diagnostic testing as they found out that her right nipple is becoming inverted.

## 2011-06-04 NOTE — Telephone Encounter (Signed)
noted 

## 2011-06-15 ENCOUNTER — Ambulatory Visit
Admission: RE | Admit: 2011-06-15 | Discharge: 2011-06-15 | Disposition: A | Discharge status: Home / Self Care | Payer: PRIVATE HEALTH INSURANCE | Source: Ambulatory Visit | Attending: Family Medicine | Admitting: Family Medicine

## 2011-06-15 DIAGNOSIS — N6459 Other signs and symptoms in breast: Secondary | ICD-10-CM

## 2011-06-29 ENCOUNTER — Ambulatory Visit (INDEPENDENT_AMBULATORY_CARE_PROVIDER_SITE_OTHER): Payer: PRIVATE HEALTH INSURANCE | Admitting: Family Medicine

## 2011-06-29 ENCOUNTER — Encounter: Payer: Self-pay | Admitting: Family Medicine

## 2011-06-29 VITALS — BP 130/90 | Temp 98.6°F | Ht 59.5 in | Wt 134.0 lb

## 2011-06-29 DIAGNOSIS — F988 Other specified behavioral and emotional disorders with onset usually occurring in childhood and adolescence: Secondary | ICD-10-CM

## 2011-06-29 DIAGNOSIS — I10 Essential (primary) hypertension: Secondary | ICD-10-CM

## 2011-06-29 DIAGNOSIS — N760 Acute vaginitis: Secondary | ICD-10-CM

## 2011-06-29 DIAGNOSIS — R5383 Other fatigue: Secondary | ICD-10-CM

## 2011-06-29 DIAGNOSIS — R5381 Other malaise: Secondary | ICD-10-CM

## 2011-06-29 LAB — POCT URINALYSIS DIPSTICK
Bilirubin, UA: NEGATIVE
Blood, UA: NEGATIVE
Glucose, UA: NEGATIVE
Leukocytes, UA: NEGATIVE
Nitrite, UA: NEGATIVE
Urobilinogen, UA: 0.2
pH, UA: 5.5

## 2011-06-29 LAB — CBC WITH DIFFERENTIAL/PLATELET
Basophils Absolute: 0 10*3/uL (ref 0.0–0.1)
Eosinophils Absolute: 0.1 10*3/uL (ref 0.0–0.7)
Lymphocytes Relative: 32.5 % (ref 12.0–46.0)
MCHC: 33.7 g/dL (ref 30.0–36.0)
Monocytes Relative: 5.3 % (ref 3.0–12.0)
Neutro Abs: 3.1 10*3/uL (ref 1.4–7.7)
Platelets: 191 10*3/uL (ref 150.0–400.0)
RDW: 12.9 % (ref 11.5–14.6)

## 2011-06-29 LAB — T3, FREE: T3, Free: 3.5 pg/mL (ref 2.3–4.2)

## 2011-06-29 LAB — BASIC METABOLIC PANEL
BUN: 19 mg/dL (ref 6–23)
Calcium: 9.2 mg/dL (ref 8.4–10.5)
GFR: 76.94 mL/min (ref >60.00)
Glucose, Bld: 90 mg/dL (ref 70–99)
Potassium: 3.4 mEq/L — ABNORMAL LOW (ref 3.5–5.1)
Sodium: 141 mEq/L (ref 135–145)

## 2011-06-29 LAB — HEPATIC FUNCTION PANEL
ALT: 27 U/L (ref 0–35)
AST: 30 U/L (ref 0–37)
Albumin: 4.1 g/dL (ref 3.5–5.2)
Alkaline Phosphatase: 80 U/L (ref 39–117)
Bilirubin, Direct: 0 mg/dL (ref 0.0–0.3)
Total Protein: 7.3 g/dL (ref 6.0–8.3)

## 2011-06-29 LAB — LIPID PANEL
Cholesterol: 284 mg/dL — ABNORMAL HIGH (ref 0–200)
VLDL: 63.6 mg/dL — ABNORMAL HIGH (ref 0.0–40.0)

## 2011-06-29 MED ORDER — TRIAMTERENE-HCTZ 37.5-25 MG PO TABS: 1.0000 | ORAL_TABLET | Freq: Every day | ORAL | Status: DC

## 2011-06-29 MED ORDER — ESTROGENS CONJUGATED 0.3 MG PO TABS: 0.3000 mg | ORAL_TABLET | Freq: Every day | ORAL | Status: DC

## 2011-06-29 NOTE — Patient Instructions (Signed)
Continue your current medications  In November begin tapering the Premarin by taking one Monday Wednesday Friday until we see you next June  I will call you about your lab work

## 2011-06-29 NOTE — Progress Notes (Signed)
  Subjective:    Patient ID: Diana [name], female    DOB: 1950/04/30, 61 y.o.   MRN: [id number]  HPI Diana [name] is a 61 year old married female nonsmoker who comes in today for general physical examination  He has a history of adult ADD for which he takes Adderall 30 mg plain daily  She takes Maxide 37.5-25 daily for hypertension BP today 130/90  She states she feels tired fatigued has noticed weight gain and hair falling out. Her sister has a history of hypothyroidism.  She gets routine eye care, dental care, BSE monthly, recent mammogram and they did diagnostic studies because she noticed inversion of her right nipple. Studies all normal. Tetanus 2005, Pneumovax 2009, information given on shingles  She also takes Premarin 0.3 daily for the last 2 years recommended she start tapering this winter   Review of Systems  Constitutional: Positive for fatigue.  HENT: Negative.   Eyes: Negative.   Respiratory: Negative.   Cardiovascular: Negative.   Gastrointestinal: Negative.   Genitourinary: Negative.   Musculoskeletal: Negative.   Neurological: Negative.   Hematological: Negative.   Psychiatric/Behavioral: Negative.        Objective:   Physical Exam  Constitutional: She appears well-developed and well-nourished.  HENT:  Head: Normocephalic and atraumatic.  Right Ear: External ear normal.  Left Ear: External ear normal.  Nose: Nose normal.  Mouth/Throat: Oropharynx is clear and moist.  Eyes: EOM are normal. Pupils are equal, round, and reactive to light.  Neck: Normal range of motion. Neck supple. No thyromegaly present.  Cardiovascular: Normal rate, regular rhythm, normal heart sounds and intact distal pulses.  Exam reveals no gallop and no friction rub.   No murmur heard. Pulmonary/Chest: Effort normal and breath sounds normal.  Abdominal: Soft. Bowel sounds are normal. She exhibits no distension and no mass. There is no tenderness. There is no rebound.  Genitourinary:  Vagina normal. Guaiac negative stool. No vaginal discharge found.       Uterus surgically removed vaginal vault normal  Bilateral breast exam shows both breast to be normal no palpable abnormalities the right nipple is inverted but no palpable masses or discharge or drainage and again recent mammogram and ultrasound normal  Musculoskeletal: Normal range of motion.  Lymphadenopathy:    She has no cervical adenopathy.  Neurological: She is alert. She has normal reflexes. No cranial nerve deficit. She exhibits normal muscle tone. Coordination normal.  Skin: Skin is warm and dry.  Psychiatric: She has a normal mood and affect. Her behavior is normal. Judgment and thought content normal.          Assessment & Plan:  Healthy female  Postmenopausal continue Premarin 0.3 begin to taper this winter  Adult ADD continue Adderall 30 mg daily  Hypertension continue Maxzide 1 daily  Fatigue check thyroid level  An inverted right nipple continue and you mammography monthly exams

## 2011-10-14 ENCOUNTER — Other Ambulatory Visit: Payer: Self-pay | Admitting: Family Medicine

## 2012-01-05 ENCOUNTER — Ambulatory Visit (INDEPENDENT_AMBULATORY_CARE_PROVIDER_SITE_OTHER): Payer: BC Managed Care – PPO | Admitting: Family Medicine

## 2012-01-05 VITALS — BP 140/90 | Temp 98.8°F

## 2012-01-05 DIAGNOSIS — N6009 Solitary cyst of unspecified breast: Secondary | ICD-10-CM

## 2012-01-05 NOTE — Patient Instructions (Signed)
If the lesion increases in size then return and we will excise it

## 2012-01-05 NOTE — Progress Notes (Signed)
  Subjective:    Patient ID: Diana [name], female    DOB: 09/10/1950, 61 y.o.   MRN: [id number]  HPI Gavin Pound is a 61 year old female who comes in today for evaluation of a lump on her right nipple  She's had a history of bilateral implants which were removed years ago however the silicone leak. Because of that she does frequent breast exams and gets annual mammography. Recently about 2 weeks ago she noticed a lump on her right nipple.   Review of Systems    review of systems otherwise negative Objective:   Physical Exam  Well-developed well-nourished female no acute distress examination of left breast normal right breast normal except for some old lumps related to the silicone leakage. The nipple shows a small 1 mm round cystic type lesion      Assessment & Plan:  Cystic lesion right nipple observe return for removal if it increases in size

## 2012-05-22 ENCOUNTER — Other Ambulatory Visit: Payer: Self-pay | Admitting: *Deleted

## 2012-05-22 MED ORDER — TRIAMTERENE-HCTZ 37.5-25 MG PO TABS: 1.0000 | ORAL_TABLET | Freq: Every day | ORAL | Status: DC

## 2012-09-05 ENCOUNTER — Encounter: Payer: Self-pay | Admitting: Internal Medicine

## 2013-02-26 ENCOUNTER — Encounter: Payer: Self-pay | Admitting: Internal Medicine

## 2013-02-26 ENCOUNTER — Other Ambulatory Visit: Payer: Self-pay

## 2013-02-26 DIAGNOSIS — Z1231 Encounter for screening mammogram for malignant neoplasm of breast: Secondary | ICD-10-CM

## 2013-02-28 ENCOUNTER — Encounter: Payer: Self-pay | Admitting: *Deleted

## 2013-03-20 ENCOUNTER — Ambulatory Visit
Admission: RE | Admit: 2013-03-20 | Discharge: 2013-03-20 | Disposition: A | Discharge status: Home / Self Care | Payer: Medicare Other | Source: Ambulatory Visit

## 2013-03-20 ENCOUNTER — Other Ambulatory Visit (INDEPENDENT_AMBULATORY_CARE_PROVIDER_SITE_OTHER): Payer: Medicare Other

## 2013-03-20 DIAGNOSIS — I10 Essential (primary) hypertension: Secondary | ICD-10-CM

## 2013-03-20 DIAGNOSIS — Z1231 Encounter for screening mammogram for malignant neoplasm of breast: Secondary | ICD-10-CM

## 2013-03-20 DIAGNOSIS — Z0000 Encounter for general adult medical examination without abnormal findings: Secondary | ICD-10-CM

## 2013-03-20 LAB — CBC WITH DIFFERENTIAL/PLATELET
BASOS ABS: 0 10*3/uL (ref 0.0–0.1)
Basophils Relative: 0.7 % (ref 0.0–3.0)
EOS ABS: 0.1 10*3/uL (ref 0.0–0.7)
Eosinophils Relative: 2.1 % (ref 0.0–5.0)
HEMATOCRIT: 41.4 % (ref 36.0–46.0)
HEMOGLOBIN: 13.8 g/dL (ref 12.0–15.0)
LYMPHS ABS: 2.1 10*3/uL (ref 0.7–4.0)
Lymphocytes Relative: 38.4 % (ref 12.0–46.0)
MCHC: 33.2 g/dL (ref 30.0–36.0)
MCV: 89.5 fl (ref 78.0–100.0)
Monocytes Absolute: 0.3 10*3/uL (ref 0.1–1.0)
Monocytes Relative: 5.1 % (ref 3.0–12.0)
NEUTROS ABS: 2.9 10*3/uL (ref 1.4–7.7)
Neutrophils Relative %: 53.7 % (ref 43.0–77.0)
PLATELETS: 227 10*3/uL (ref 150.0–400.0)
RBC: 4.63 Mil/uL (ref 3.87–5.11)
RDW: 13.3 % (ref 11.5–14.6)
WBC: 5.4 10*3/uL (ref 4.5–10.5)

## 2013-03-20 LAB — TSH: TSH: 2.25 u[IU]/mL (ref 0.35–5.50)

## 2013-03-20 LAB — BASIC METABOLIC PANEL
BUN: 25 mg/dL — AB (ref 6–23)
CO2: 31 meq/L (ref 19–32)
Calcium: 9.7 mg/dL (ref 8.4–10.5)
Chloride: 101 mEq/L (ref 96–112)
Creatinine, Ser: 1 mg/dL (ref 0.4–1.2)
GFR: 62 mL/min (ref >60.00)
GLUCOSE: 96 mg/dL (ref 70–99)
Potassium: 3.5 mEq/L (ref 3.5–5.1)
SODIUM: 137 meq/L (ref 135–145)

## 2013-03-20 LAB — POCT URINALYSIS DIPSTICK
BILIRUBIN UA: NEGATIVE
Blood, UA: NEGATIVE
GLUCOSE UA: NEGATIVE
Ketones, UA: NEGATIVE
Leukocytes, UA: NEGATIVE
Nitrite, UA: NEGATIVE
PROTEIN UA: NEGATIVE
Spec Grav, UA: 1.025
Urobilinogen, UA: 0.2
pH, UA: 5.5

## 2013-03-20 LAB — LIPID PANEL
Cholesterol: 284 mg/dL — ABNORMAL HIGH (ref 0–200)
HDL: 57 mg/dL (ref >39.00)
LDL Cholesterol: 181 mg/dL — ABNORMAL HIGH (ref 0–99)
Total CHOL/HDL Ratio: 5
Triglycerides: 230 mg/dL — ABNORMAL HIGH (ref 0.0–149.0)
VLDL: 46 mg/dL — ABNORMAL HIGH (ref 0.0–40.0)

## 2013-03-20 LAB — HEPATIC FUNCTION PANEL
ALBUMIN: 4.1 g/dL (ref 3.5–5.2)
ALK PHOS: 99 U/L (ref 39–117)
ALT: 27 U/L (ref 0–35)
AST: 24 U/L (ref 0–37)
BILIRUBIN TOTAL: 0.7 mg/dL (ref 0.3–1.2)
Bilirubin, Direct: 0 mg/dL (ref 0.0–0.3)
Total Protein: 7.5 g/dL (ref 6.0–8.3)

## 2013-03-27 ENCOUNTER — Ambulatory Visit (INDEPENDENT_AMBULATORY_CARE_PROVIDER_SITE_OTHER): Payer: Medicare Other | Admitting: Family Medicine

## 2013-03-27 ENCOUNTER — Encounter: Payer: Self-pay | Admitting: Family Medicine

## 2013-03-27 VITALS — BP 140/90 | Temp 98.7°F | Ht 60.0 in | Wt 133.0 lb

## 2013-03-27 DIAGNOSIS — N72 Inflammatory disease of cervix uteri: Secondary | ICD-10-CM

## 2013-03-27 DIAGNOSIS — N760 Acute vaginitis: Secondary | ICD-10-CM

## 2013-03-27 DIAGNOSIS — I10 Essential (primary) hypertension: Secondary | ICD-10-CM

## 2013-03-27 DIAGNOSIS — Z23 Encounter for immunization: Secondary | ICD-10-CM

## 2013-03-27 DIAGNOSIS — N7689 Other specified inflammation of vagina and vulva: Secondary | ICD-10-CM

## 2013-03-27 DIAGNOSIS — F988 Other specified behavioral and emotional disorders with onset usually occurring in childhood and adolescence: Secondary | ICD-10-CM

## 2013-03-27 MED ORDER — TRIAMTERENE-HCTZ 37.5-25 MG PO TABS: 1.0000 | ORAL_TABLET | Freq: Every day | ORAL | Status: DC

## 2013-03-27 MED ORDER — ESTROGENS, CONJUGATED 0.625 MG/GM VA CREA: 1.0000 | TOPICAL_CREAM | Freq: Every day | VAGINAL | Status: DC

## 2013-03-27 NOTE — Patient Instructions (Signed)
Continue the Maxide one tablet daily in the morning  Premarin vaginal cream........... small amounts 2-3 times weekly  Return in one year for general physical exam sooner if any problems

## 2013-03-27 NOTE — Progress Notes (Signed)
Pre visit review using our clinic review tool, if applicable. No additional management support is needed unless otherwise documented below in the visit note. 

## 2013-03-27 NOTE — Addendum Note (Signed)
Addended by: Westley Hummer B on: 03/27/2013 03:45 PM   Modules accepted: Orders

## 2013-03-27 NOTE — Progress Notes (Signed)
   Subjective:    Patient ID: Diana [name], female    DOB: 07/31/1950, 63 y.o.   MRN: [id number]  HPI Diana [name] is a 63 year old married female nonsmoker who comes in today for general physical examination  She takes Adderall 30 mg daily for adult ADD  She takes Maxzide 25 one daily for hypertension BP 140/90  She takes Wellbutrin 2 tabs daily for history of mild depression  She takes Nexium 20 mg when necessary for reflux. She's also having some stiffness in her left shoulder  She gets routine eye care, dental care, BSE monthly, and you mammography, recent colonoscopy normal, vaccinations updated by Apolonio Schneiders   Review of Systems  Constitutional: Negative.   HENT: Negative.   Eyes: Negative.   Respiratory: Negative.   Cardiovascular: Negative.   Gastrointestinal: Negative.   Endocrine: Negative.   Genitourinary: Negative.   Musculoskeletal: Negative.   Allergic/Immunologic: Negative.   Neurological: Negative.   Hematological: Negative.   Psychiatric/Behavioral: Negative.        Objective:   Physical Exam  Nursing note and vitals reviewed. Constitutional: She appears well-developed and well-nourished.  HENT:  Head: Normocephalic and atraumatic.  Right Ear: External ear normal.  Left Ear: External ear normal.  Nose: Nose normal.  Mouth/Throat: Oropharynx is clear and moist.  Eyes: EOM are normal. Pupils are equal, round, and reactive to light.  Neck: Normal range of motion. Neck supple. No thyromegaly present.  Cardiovascular: Normal rate, regular rhythm, normal heart sounds and intact distal pulses.  Exam reveals no gallop and no friction rub.   No murmur heard. Pulmonary/Chest: Effort normal and breath sounds normal.  Abdominal: Soft. Bowel sounds are normal. She exhibits no distension and no mass. There is no tenderness. There is no rebound.  Genitourinary:  She had a hysterectomy for benign reasons  Musculoskeletal: Normal range of motion.  Lymphadenopathy:   She has no cervical adenopathy.  Neurological: She is alert. She has normal reflexes. No cranial nerve deficit. She exhibits normal muscle tone. Coordination normal.  Skin: Skin is warm and dry.  Total body skin exam normal  Psychiatric: She has a normal mood and affect. Her behavior is normal. Judgment and thought content normal.          Assessment & Plan:  Healthy female  Adult ADD continue Adderall 30 mg daily  Postmenopausal vaginal dryness Premarin vaginal cream  Hypertension continue Maxzide  History of depression Wellbutrin 2 tabs daily  Reflux esophagitis Nexium 20 mg daily  Bursitis of shoulder prescribe Motrin range of motion and ice

## 2013-03-28 ENCOUNTER — Telehealth: Payer: Self-pay | Admitting: Family Medicine

## 2013-03-28 NOTE — Telephone Encounter (Signed)
Relevant patient education assigned to patient using Emmi. ° °

## 2013-04-24 ENCOUNTER — Ambulatory Visit: Payer: BC Managed Care – PPO | Admitting: Internal Medicine

## 2014-02-12 ENCOUNTER — Encounter: Payer: Self-pay | Admitting: Family Medicine

## 2014-02-12 ENCOUNTER — Ambulatory Visit (INDEPENDENT_AMBULATORY_CARE_PROVIDER_SITE_OTHER): Payer: PPO | Admitting: Family Medicine

## 2014-02-12 VITALS — BP 130/82 | Temp 98.8°F | Wt 137.6 lb

## 2014-02-12 DIAGNOSIS — L659 Nonscarring hair loss, unspecified: Secondary | ICD-10-CM

## 2014-02-12 DIAGNOSIS — M79642 Pain in left hand: Secondary | ICD-10-CM

## 2014-02-12 DIAGNOSIS — M79641 Pain in right hand: Secondary | ICD-10-CM

## 2014-02-12 LAB — BASIC METABOLIC PANEL
BUN: 25 mg/dL — ABNORMAL HIGH (ref 6–23)
CHLORIDE: 101 meq/L (ref 96–112)
CO2: 26 meq/L (ref 19–32)
Calcium: 9.8 mg/dL (ref 8.4–10.5)
Creatinine, Ser: 0.75 mg/dL (ref 0.40–1.20)
GFR: 82.21 mL/min (ref >60.00)
Glucose, Bld: 105 mg/dL — ABNORMAL HIGH (ref 70–99)
POTASSIUM: 3.5 meq/L (ref 3.5–5.1)
Sodium: 138 mEq/L (ref 135–145)

## 2014-02-12 LAB — CBC WITH DIFFERENTIAL/PLATELET
BASOS PCT: 0.8 % (ref 0.0–3.0)
Basophils Absolute: 0 10*3/uL (ref 0.0–0.1)
EOS ABS: 0.1 10*3/uL (ref 0.0–0.7)
EOS PCT: 1.8 % (ref 0.0–5.0)
HEMATOCRIT: 40.1 % (ref 36.0–46.0)
Hemoglobin: 13.8 g/dL (ref 12.0–15.0)
LYMPHS PCT: 38.6 % (ref 12.0–46.0)
Lymphs Abs: 1.9 10*3/uL (ref 0.7–4.0)
MCHC: 34.5 g/dL (ref 30.0–36.0)
MCV: 84.4 fl (ref 78.0–100.0)
MONOS PCT: 5.2 % (ref 3.0–12.0)
Monocytes Absolute: 0.3 10*3/uL (ref 0.1–1.0)
Neutro Abs: 2.7 10*3/uL (ref 1.4–7.7)
Neutrophils Relative %: 53.6 % (ref 43.0–77.0)
Platelets: 236 10*3/uL (ref 150.0–400.0)
RBC: 4.75 Mil/uL (ref 3.87–5.11)
RDW: 13.4 % (ref 11.5–15.5)
WBC: 5 10*3/uL (ref 4.0–10.5)

## 2014-02-12 LAB — TSH: TSH: 3.73 u[IU]/mL (ref 0.35–4.50)

## 2014-02-12 LAB — SEDIMENTATION RATE: Sed Rate: 22 mm/hr (ref 0–22)

## 2014-02-12 NOTE — Progress Notes (Signed)
   Subjective:    Patient ID: [name], female    DOB: Jun 06, 1950, 64 y.o.   MRN: [id number]  HPI Neoma Laming is a 64 year old married female nonsmoker who comes in today with a month's history of bilateral hand pain  She says she's always has some stiffness but about a month ago the middle joints in both hands became swollen red and hot. Also discomfort in her wrist and her elbows and hips. She's not taken any anti-inflammatories because she had a diverticular bleed and had to have a portion of her colon removed.  Family history pertinent....Marland KitchenMarland Kitchen her maternal grandmother had rheumatoid arthritis mother had osteoarthritis  She's also noticed hair loss and is concerned about thyroid disease   Review of Systems Review of systems otherwise negative    Objective:   Physical Exam  Well-developed well-nourished female no acute distress vital signs stable she's afebrile examination hand shows some redness and swelling of all the middle joints of both hands. Wrists are normal elbows are normal feet are normal  Neck exam normal thyroid normal      Assessment & Plan:  Four-week history of joint pain swelling and redness question RA...... begin workup  Hair loss............ check thyroid level

## 2014-02-12 NOTE — Patient Instructions (Signed)
Labs today  Motrin 400 mg twice daily with food  I will call you the reports ASAP

## 2014-02-13 LAB — CYCLIC CITRUL PEPTIDE ANTIBODY, IGG: Cyclic Citrullin Peptide Ab: <2 U/mL (ref 0.0–5.0)

## 2014-02-13 LAB — RHEUMATOID FACTOR: Rhuematoid fact SerPl-aCnc: <10 IU/mL (ref <=14)

## 2014-02-13 LAB — ANA: Anti Nuclear Antibody(ANA): NEGATIVE

## 2014-03-24 ENCOUNTER — Other Ambulatory Visit: Payer: Self-pay | Admitting: Family Medicine

## 2014-04-26 ENCOUNTER — Encounter: Payer: Self-pay | Admitting: Internal Medicine

## 2014-07-19 ENCOUNTER — Encounter: Payer: Self-pay | Admitting: Internal Medicine

## 2014-07-19 ENCOUNTER — Ambulatory Visit (INDEPENDENT_AMBULATORY_CARE_PROVIDER_SITE_OTHER): Payer: PPO | Admitting: Internal Medicine

## 2014-07-19 VITALS — BP 138/88 | HR 88 | Ht 59.5 in | Wt 132.8 lb

## 2014-07-19 DIAGNOSIS — K219 Gastro-esophageal reflux disease without esophagitis: Secondary | ICD-10-CM

## 2014-07-19 DIAGNOSIS — R159 Full incontinence of feces: Secondary | ICD-10-CM | POA: Diagnosis not present

## 2014-07-19 DIAGNOSIS — K921 Melena: Secondary | ICD-10-CM

## 2014-07-19 MED ORDER — OMEPRAZOLE 20 MG PO CPDR: 20.0000 mg | DELAYED_RELEASE_CAPSULE | Freq: Every day | ORAL | Status: DC

## 2014-07-19 MED ORDER — HYDROCORTISONE ACETATE 25 MG RE SUPP: 25.0000 mg | Freq: Two times a day (BID) | RECTAL | Status: DC

## 2014-07-19 NOTE — Patient Instructions (Addendum)
Gastroesophageal Reflux Disease, Adult Gastroesophageal reflux disease (GERD) happens when acid from your stomach flows up into the esophagus. When acid comes in contact with the esophagus, the acid causes soreness (inflammation) in the esophagus. Over time, GERD may create small holes (ulcers) in the lining of the esophagus. CAUSES   Increased body weight. This puts pressure on the stomach, making acid rise from the stomach into the esophagus.  Smoking. This increases acid production in the stomach.  Drinking alcohol. This causes decreased pressure in the lower esophageal sphincter (valve or ring of muscle between the esophagus and stomach), allowing acid from the stomach into the esophagus.  Late evening meals and a full stomach. This increases pressure and acid production in the stomach.  A malformed lower esophageal sphincter. Sometimes, no cause is found. SYMPTOMS   Burning pain in the lower part of the mid-chest behind the breastbone and in the mid-stomach area. This may occur twice a week or more often.  Trouble swallowing.  Sore throat.  Dry cough.  Asthma-like symptoms including chest tightness, shortness of breath, or wheezing. DIAGNOSIS  Your caregiver may be able to diagnose GERD based on your symptoms. In some cases, X-rays and other tests may be done to check for complications or to check the condition of your stomach and esophagus. TREATMENT  Your caregiver may recommend over-the-counter or prescription medicines to help decrease acid production. Ask your caregiver before starting or adding any new medicines.  HOME CARE INSTRUCTIONS   Change the factors that you can control. Ask your caregiver for guidance concerning weight loss, quitting smoking, and alcohol consumption.  Avoid foods and drinks that make your symptoms worse, such as:  Caffeine or alcoholic drinks.  Chocolate.  Peppermint or mint flavorings.  Garlic and onions.  Spicy foods.  Citrus fruits,  such as oranges, lemons, or limes.  Tomato-based foods such as sauce, chili, salsa, and pizza.  Fried and fatty foods.  Avoid lying down for the 3 hours prior to your bedtime or prior to taking a nap.  Eat small, frequent meals instead of large meals.  Wear loose-fitting clothing. Do not wear anything tight around your waist that causes pressure on your stomach.  Raise the head of your bed 6 to 8 inches with wood blocks to help you sleep. Extra pillows will not help.  Only take over-the-counter or prescription medicines for pain, discomfort, or fever as directed by your caregiver.  Do not take aspirin, ibuprofen, or other nonsteroidal anti-inflammatory drugs (NSAIDs). SEEK IMMEDIATE MEDICAL CARE IF:   You have pain in your arms, neck, jaw, teeth, or back.  Your pain increases or changes in intensity or duration.  You develop nausea, vomiting, or sweating (diaphoresis).  You develop shortness of breath, or you faint.  Your vomit is green, yellow, black, or looks like coffee grounds or blood.  Your stool is red, bloody, or black. These symptoms could be signs of other problems, such as heart disease, gastric bleeding, or esophageal bleeding. MAKE SURE YOU:   Understand these instructions.  Will watch your condition.  Will get help right away if you are not doing well or get worse. Document Released: 10/14/2004 Document Revised: 03/29/2011 Document Reviewed: 07/24/2010 [facility] Patient Information 2015 [place], [place]. This information is not intended to replace advice given to you by your health care provider. Make sure you discuss any questions you have with your health care provider.   Food Choices for Gastroesophageal Reflux Disease When you have gastroesophageal reflux disease (GERD), the  foods you eat and your eating habits are very important. Choosing the right foods can help ease the discomfort of GERD. WHAT GENERAL GUIDELINES DO I NEED TO FOLLOW?  Choose fruits,  vegetables, whole grains, low-fat dairy products, and low-fat meat, fish, and poultry.  Limit fats such as oils, salad dressings, butter, nuts, and avocado.  Keep a food diary to identify foods that cause symptoms.  Avoid foods that cause reflux. These may be different for different people.  Eat frequent small meals instead of three large meals each day.  Eat your meals slowly, in a relaxed setting.  Limit fried foods.  Cook foods using methods other than frying.  Avoid drinking alcohol.  Avoid drinking large amounts of liquids with your meals.  Avoid bending over or lying down until 2-3 hours after eating. WHAT FOODS ARE NOT RECOMMENDED? The following are some foods and drinks that may worsen your symptoms: Vegetables Tomatoes. Tomato juice. Tomato and spaghetti sauce. Chili peppers. Onion and garlic. Horseradish. Fruits Oranges, grapefruit, and lemon (fruit and juice). Meats High-fat meats, fish, and poultry. This includes hot dogs, ribs, ham, sausage, salami, and bacon. Dairy Whole milk and chocolate milk. Sour cream. Cream. Butter. Ice cream. Cream cheese.  Beverages Coffee and tea, with or without caffeine. Carbonated beverages or energy drinks. Condiments Hot sauce. Barbecue sauce.  Sweets/Desserts Chocolate and cocoa. Donuts. Peppermint and spearmint. Fats and Oils High-fat foods, including [place] fries and potato chips. Other Vinegar. Strong spices, such as black pepper, white pepper, red pepper, cayenne, curry powder, cloves, ginger, and chili powder. The items listed above may not be a complete list of foods and beverages to avoid. Contact your dietitian for more information. Document Released: 01/04/2005 Document Revised: 01/09/2013 Document Reviewed: 11/08/2012 [facility] Patient Information 2015 [place], [place]. This information is not intended to replace advice given to you by your health care provider. Make sure you discuss any questions you have with your  health care provider.   You have been scheduled for an endoscopy. Please follow written instructions given to you at your visit today. If you use inhalers (even only as needed), please bring them with you on the day of your procedure. Your physician has requested that you go to www.startemmi.com and enter the access code given to you at your visit today. This web site gives a general overview about your procedure. However, you should still follow specific instructions given to you by our office regarding your preparation for the procedure.  Your prescriptions will be sent to your pharmacy Dr Sherren Mocha

## 2014-07-19 NOTE — Progress Notes (Signed)
Diana [name] 1950-03-16 [id number]  Note: This dictation was prepared with Dragon digital system. Any transcriptional errors that result from this procedure are unintentional. Referred by Dr Sherren Mocha  History of Present Illness: This is a 64 year old white female with  chronic gastroesophageal reflux with recent exacerbation. She describes burning substernally as well as nocturnal cough and regurgitation of acid. She had an upper endoscopy by me  in October 2004 which showed  grade 1 esophagitis. There was no Barrett's esophagus. She denies dysphagia. She has tried over-the-counter Nexium, Prilosec and ranitidine with only partial relief. We have seen her in the past for perforated diverticulitis, necessitating sigmoid resection in 2009. Colonoscopy prior to the episode of diverticulitis showed severe diverticulosis of the left colon with partial obstruction. She also had a hemorrhoid surgery in the past .She is complaining  almost daily leakage of mucus and sometimes small amount of blood. Her bowel habits are otherwise regular. There is no family history of colon cancer    Past Medical History  Diagnosis Date  . Diverticulosis   . GERD (gastroesophageal reflux disease)   . DJD (degenerative joint disease)   . ADD (attention deficit disorder)   . Mood disorder     Past Surgical History  Procedure Laterality Date  . Total abdominal hysterectomy    . Hemorrhoid surgery    . Rhinoplasty      x 2  . Pilonidal cyst excision    . Placement of breast implants    . Breast implant removal    . Shoulder surgery Right   . Partial colectomy      Allergies  Allergen Reactions  . Ezetimibe     REACTION: statins make joints ache  . Morphine Sulfate     REACTION: n \\T \ v    Family history and social history have been reviewed.  Review of Systems: Negative for dysphagia. Negative for weight loss. Positive for small amount of bleeding with the bowel movements  The remainder of the 10  point ROS is negative except as outlined in the H&P  Physical Exam: General Appearance Well developed, in no distress Eyes  Non icteric  HEENT  Non traumatic, normocephalic  Mouth No lesion, tongue papillated, no cheilosis Neck Supple without adenopathy, thyroid not enlarged, no carotid bruits, no JVD Lungs Clear to auscultation bilaterally COR Normal S1, normal S2, regular rhythm, no murmur, quiet precordium Abdomen soft, nontender, normoactive bowel sounds. No distention Rectal and anoscopic exam reveals normal perianal area. Normal rectal sphincter tone. Prominent  anal papillae with increased mucus in the rectal ampulla. No blood. Stool is  Hemoccult-negative. I don't see any hemorrhoids. She had a prior hemorrhoidectomy Extremities  No pedal edema Skin No lesions Neurological Alert and oriented x 3 Psychological Normal mood and affect  Assessment and Plan:   64 year old white female with the persistent gastroesophageal reflux refractory to over-the-counter medications and antireflux measures. We will proceed with upper endoscopy to rule out Barrett's esophagus and or reflux esophagitis. She will be started on Prilosec 40 mg at bedtime and I have discussed the antireflux measures cells well.  Rectal leakage and all small amount of blood with wiping likely due to proctitis. Last colonoscopy 2009. No polyps or other risk factors. We will treat first with hydrocortisone suppository 25 mg at bedtime  History of perforated diverticulitis. Status post the segmental sigmoid resection in 2009. Doing well, recall colonoscopy 2019  Referred by DR Harriet Pho 07/19/2014

## 2014-07-24 ENCOUNTER — Encounter: Payer: Self-pay | Admitting: Internal Medicine

## 2014-07-24 ENCOUNTER — Ambulatory Visit (AMBULATORY_SURGERY_CENTER): Payer: PPO | Admitting: Internal Medicine

## 2014-07-24 VITALS — BP 136/96 | HR 60 | Temp 98.2°F | Resp 16 | Ht 59.0 in | Wt 132.0 lb

## 2014-07-24 DIAGNOSIS — K219 Gastro-esophageal reflux disease without esophagitis: Secondary | ICD-10-CM | POA: Diagnosis not present

## 2014-07-24 DIAGNOSIS — R05 Cough: Secondary | ICD-10-CM

## 2014-07-24 DIAGNOSIS — R059 Cough, unspecified: Secondary | ICD-10-CM

## 2014-07-24 MED ORDER — OMEPRAZOLE 40 MG PO CPDR: 40.0000 mg | DELAYED_RELEASE_CAPSULE | Freq: Every day | ORAL | Status: DC

## 2014-07-24 MED ORDER — SODIUM CHLORIDE 0.9 % IV SOLN: 500.0000 mL | INTRAVENOUS | Status: DC

## 2014-07-24 NOTE — Patient Instructions (Addendum)
YOU HAD AN ENDOSCOPIC PROCEDURE TODAY AT THE [facility]:   Refer to the procedure report that was given to you for any specific questions about what was found during the examination.  If the procedure report does not answer your questions, please call your gastroenterologist to clarify.  If you requested that your care partner not be given the details of your procedure findings, then the procedure report has been included in a sealed envelope for you to review at your convenience later.  YOU SHOULD EXPECT: Some feelings of bloating in the abdomen. Passage of more gas than usual.  Walking can help get rid of the air that was put into your GI tract during the procedure and reduce the bloating. If you had a lower endoscopy (such as a colonoscopy or flexible sigmoidoscopy) you may notice spotting of blood in your stool or on the toilet paper. If you underwent a bowel prep for your procedure, you may not have a normal bowel movement for a few days.  Please Note:  You might notice some irritation and congestion in your nose or some drainage.  This is from the oxygen used during your procedure.  There is no need for concern and it should clear up in a day or so.  SYMPTOMS TO REPORT IMMEDIATELY:    Following upper endoscopy (EGD)  Vomiting of blood or coffee ground material  New chest pain or pain under the shoulder blades  Painful or persistently difficult swallowing  New shortness of breath  Fever of 100F or higher  Black, tarry-looking stools  For urgent or emergent issues, a gastroenterologist can be reached at any hour by calling [phone].   DIET: Your first meal following the procedure should be a small meal and then it is ok to progress to your normal diet. Heavy or fried foods are harder to digest and may make you feel nauseous or bloated.  Likewise, meals heavy in dairy and vegetables can increase bloating.  Drink plenty of fluids but you should avoid alcoholic beverages  for 24 hours.  ACTIVITY:  You should plan to take it easy for the rest of today and you should NOT DRIVE or use heavy machinery until tomorrow (because of the sedation medicines used during the test).    FOLLOW UP: Our staff will call the number listed on your records the next business day following your procedure to check on you and address any questions or concerns that you may have regarding the information given to you following your procedure. If we do not reach you, we will leave a message.  However, if you are feeling well and you are not experiencing any problems, there is no need to return our call.  We will assume that you have returned to your regular daily activities without incident.  If any biopsies were taken you will be contacted by phone or by letter within the next 1-3 weeks.  Please call us at [phone] if you have not heard about the biopsies in 3 weeks.    SIGNATURES/CONFIDENTIALITY: You and/or your care partner have signed paperwork which will be entered into your electronic medical record.  These signatures attest to the fact that that the information above on your After Visit Summary has been reviewed and is understood.  Full responsibility of the confidentiality of this discharge information lies with you and/or your care-partner. 

## 2014-07-24 NOTE — Op Note (Signed)
[facility]  [address] [place], [id number]   ENDOSCOPY PROCEDURE REPORT  PATIENT: Diana, [name]  MR#: [id number] BIRTHDATE: 1950-11-29 , 62  yrs. old GENDER: female ENDOSCOPIST: Lafayette Dragon, MD REFERRED BY:  Stevie Kern, MD PROCEDURE DATE:  07/24/2014 PROCEDURE:  EGD, diagnostic ASA CLASS:     Class II INDICATIONS:  Gastroesophageal reflux.  Cough.  Regurgitation of food.  Last endoscopy in October 2004 showed grade 1 esophagitis. Refractory to over-the-counter PPIs. MEDICATIONS: Monitored anesthesia care and Propofol 120 mg IV TOPICAL ANESTHETIC: none  DESCRIPTION OF PROCEDURE: After the risks benefits and alternatives of the procedure were thoroughly explained, informed consent was obtained.  The LB [id number] endoscope was introduced through the mouth and advanced to the second portion of the duodenum , Without limitations.  The instrument was slowly withdrawn as the mucosa was fully examined.      ESOPHAGUS: The mucosa of the esophagus appeared normal. squamocolumnar junction is irregular no evidence of esophagitis or stricture  STOMACH: The mucosa of the stomach appeared normal. scattered fundic around polyps, no evidence of gastritis. No hiatal hernia Retroflexed views revealed no abnormalities.     The scope was then withdrawn from the patient and the procedure completed.  Duodenum: duodenal bulb and descending duodenum was normal  COMPLICATIONS: There were no immediate complications.  ENDOSCOPIC IMPRESSION: 1.   The mucosa of the esophagus appeared normal 2.   The mucosa of the stomach appeared normal 3  .normal duodenal bulb and descending duodenum RECOMMENDATIONS: 1.  Anti-reflux regimen to be follow 2.  No evidence of Barrett's esophagus.  Continue Prilosec 40 mg daily for 4-6 weeks.  If cough doesn't improve would stop Prilosec   REPEAT EXAM:  eSigned:  Lafayette Dragon, MD 07/24/2014 9:00 AM    CC:

## 2014-07-24 NOTE — Progress Notes (Signed)
Transferred to recovery room. A/O x3, pleased with MAC.  VSS.  Report to Karen, RN. 

## 2014-07-25 ENCOUNTER — Telehealth: Payer: Self-pay

## 2014-07-25 NOTE — Telephone Encounter (Signed)
  Follow up Call-  Call back number 07/24/2014  Post procedure Call Back phone  # [phone]  Permission to leave phone message Yes     Patient questions:  Do you have a fever, pain , or abdominal swelling? No. Pain Score  0 *  Have you tolerated food without any problems? Yes.    Have you been able to return to your normal activities? Yes.    Do you have any questions about your discharge instructions: Diet   No. Medications  No. Follow up visit  No.  Do you have questions or concerns about your Care? No.  Actions: * If pain score is 4 or above: No action needed, pain <4.

## 2014-09-02 ENCOUNTER — Encounter: Payer: Self-pay | Admitting: Family

## 2014-09-02 ENCOUNTER — Ambulatory Visit (INDEPENDENT_AMBULATORY_CARE_PROVIDER_SITE_OTHER): Payer: PPO | Admitting: Family

## 2014-09-02 VITALS — BP 120/80 | HR 68 | Temp 98.8°F | Wt 135.0 lb

## 2014-09-02 DIAGNOSIS — H6123 Impacted cerumen, bilateral: Secondary | ICD-10-CM | POA: Diagnosis not present

## 2014-09-02 NOTE — Patient Instructions (Signed)
Cerumen Impaction °A cerumen impaction is when the wax in your ear forms a plug. This plug usually causes reduced hearing. Sometimes it also causes an earache or dizziness. Removing a cerumen impaction can be difficult and painful. The wax sticks to the ear canal. The canal is sensitive and bleeds easily. If you try to remove a heavy wax buildup with a cotton tipped swab, you may push it in further. °Irrigation with water, suction, and small ear curettes may be used to clear out the wax. If the impaction is fixed to the skin in the ear canal, ear drops may be needed for a few days to loosen the wax. People who build up a lot of wax frequently can use ear wax removal products available in your local drugstore. °SEEK MEDICAL CARE IF:  °You develop an earache, increased hearing loss, or marked dizziness. °Document Released: 02/12/2004 Document Revised: 03/29/2011 Document Reviewed: 04/03/2009 °[employer]® Patient Information ©2015 [employer]. This information is not intended to replace advice given to you by your health care provider. Make sure you discuss any questions you have with your health care provider. ° °

## 2014-09-02 NOTE — Progress Notes (Signed)
Subjective:    Patient ID: Diana [name], female    DOB: [birth year], 64 y.o.   MRN: [id number]  HPI  64 year old white female in today with complaints of colonic her ears are stopped up. Denies any sneezing, cough or congestion currently but reports have been URI symptoms about a week ago.  Review of Systems  Constitutional: Negative.   HENT:       Ears congested  Respiratory: Negative.   Cardiovascular: Negative.   Skin: Negative.   Allergic/Immunologic: Negative.    Past Medical History  Diagnosis Date  . Diverticulosis   . GERD (gastroesophageal reflux disease)   . DJD (degenerative joint disease)   . ADD (attention deficit disorder)   . Mood disorder     Social History   Social History  . Marital Status: Married    Spouse Name: N/A  . Number of Children: N/A  . Years of Education: N/A   Occupational History  . Not on file.   Social History Main Topics  . Smoking status: Former Smoker    Quit date: 06/29/2006  . Smokeless tobacco: Not on file  . Alcohol Use: No  . Drug Use: No  . Sexual Activity: Not on file   Other Topics Concern  . Not on file   Social History Narrative    Past Surgical History  Procedure Laterality Date  . Total abdominal hysterectomy    . Hemorrhoid surgery    . Rhinoplasty      x 2  . Pilonidal cyst excision    . Placement of breast implants    . Breast implant removal    . Shoulder surgery Right   . Partial colectomy      Family History  Problem Relation Age of Onset  . Melanoma Mother     Allergies  Allergen Reactions  . Ezetimibe     REACTION: statins make joints ache  . Morphine Sulfate     REACTION: n \T\ v    Current Outpatient Prescriptions on File Prior to Visit  Medication Sig Dispense Refill  . amphetamine-dextroamphetamine (ADDERALL XR) 20 MG 24 hr capsule TK ONE C PO BID  0  . co-enzyme Q-10 30 MG capsule Take 30 mg by mouth 3 (three) times daily.    . Flaxseed, Linseed, (FLAX SEEDS PO) Take by  mouth.    . hydrocortisone (ANUSOL-HC) 25 MG suppository Place 1 suppository (25 mg total) rectally every 12 (twelve) hours. 10 suppository 1  . KRILL OIL PO Take by mouth.    Marland Kitchen omeprazole (PRILOSEC) 40 MG capsule Take 1 capsule (40 mg total) by mouth daily. 30 capsule 3  . triamterene-hydrochlorothiazide (MAXZIDE-25) 37.5-25 MG per tablet TAKE 1 TABLET BY MOUTH ONCE DAILY 100 tablet 2  . Vitamin D, Ergocalciferol, (DRISDOL) 50000 UNITS CAPS Take 50,000 Units by mouth.     No current facility-administered medications on file prior to visit.    BP 120/80 mmHg  Pulse 68  Temp(Src) 98.8 F (37.1 C) (Oral)  Wt 135 lb (61.236 kg)chart    Objective:   Physical Exam  Constitutional: She is oriented to person, place, and time. She appears well-developed and well-nourished.  HENT:  Nose: Nose normal.  Mouth/Throat: Oropharynx is clear and moist.  Cerumen impaction bilaterally  Neck: Normal range of motion. Neck supple.  Cardiovascular: Normal rate, regular rhythm and normal heart sounds.   Pulmonary/Chest: Effort normal and breath sounds normal.  Neurological: She is alert and oriented to person, place, and time.  Skin: Skin is warm and dry.  Psychiatric: She has a normal mood and affect.       Informed consent was obtained and peroxide gel was inserted into the ears bilaterally using the lavage kit the ears were lavaged until clean.Inspection with a cerumen spoon removed residual wax. Patient tolerated the procedure well.    Assessment & Plan:  Gayle was seen today for ear fullness.  Diagnoses and all orders for this visit:  Cerumen impaction, bilateral   Follow-up as needed.

## 2014-09-09 ENCOUNTER — Ambulatory Visit (INDEPENDENT_AMBULATORY_CARE_PROVIDER_SITE_OTHER): Payer: PPO | Admitting: Family Medicine

## 2014-09-09 ENCOUNTER — Encounter: Payer: Self-pay | Admitting: Family Medicine

## 2014-09-09 VITALS — BP 126/82 | HR 84 | Temp 98.6°F | Ht 59.0 in | Wt 137.3 lb

## 2014-09-09 DIAGNOSIS — R222 Localized swelling, mass and lump, trunk: Secondary | ICD-10-CM

## 2014-09-09 DIAGNOSIS — R229 Localized swelling, mass and lump, unspecified: Secondary | ICD-10-CM

## 2014-09-09 NOTE — Progress Notes (Signed)
Pre visit review using our clinic review tool, if applicable. No additional management support is needed unless otherwise documented below in the visit note. 

## 2014-09-09 NOTE — Progress Notes (Signed)
HPI:  Acute visit for:  "Lump on back" -noticed the last few days, mildy uncomfortable, feels like skin is hypersensitive in this sport -she wants to know for sure what this is -she desires removal -denies: pain, drainage, fevers  ROS: See pertinent positives and negatives per HPI.  Past Medical History  Diagnosis Date  . Diverticulosis   . GERD (gastroesophageal reflux disease)   . DJD (degenerative joint disease)   . ADD (attention deficit disorder)   . Mood disorder     Past Surgical History  Procedure Laterality Date  . Total abdominal hysterectomy    . Hemorrhoid surgery    . Rhinoplasty      x 2  . Pilonidal cyst excision    . Placement of breast implants    . Breast implant removal    . Shoulder surgery Right   . Partial colectomy      Family History  Problem Relation Age of Onset  . Melanoma Mother     Social History   Social History  . Marital Status: Married    Spouse Name: N/A  . Number of Children: N/A  . Years of Education: N/A   Social History Main Topics  . Smoking status: Former Smoker    Quit date: 06/29/2006  . Smokeless tobacco: None  . Alcohol Use: No  . Drug Use: No  . Sexual Activity: Not Asked   Other Topics Concern  . None   Social History Narrative     Current outpatient prescriptions:  .  amphetamine-dextroamphetamine (ADDERALL XR) 20 MG 24 hr capsule, TK ONE C PO BID, Disp: , Rfl: 0 .  co-enzyme Q-10 30 MG capsule, Take 30 mg by mouth 3 (three) times daily., Disp: , Rfl:  .  Flaxseed, Linseed, (FLAX SEEDS PO), Take by mouth., Disp: , Rfl:  .  hydrocortisone (ANUSOL-HC) 25 MG suppository, Place 1 suppository (25 mg total) rectally every 12 (twelve) hours., Disp: 10 suppository, Rfl: 1 .  KRILL OIL PO, Take by mouth., Disp: , Rfl:  .  omeprazole (PRILOSEC) 40 MG capsule, Take 1 capsule (40 mg total) by mouth daily., Disp: 30 capsule, Rfl: 3 .  triamterene-hydrochlorothiazide (MAXZIDE-25) 37.5-25 MG per tablet, TAKE 1  TABLET BY MOUTH ONCE DAILY, Disp: 100 tablet, Rfl: 2 .  Vitamin D, Ergocalciferol, (DRISDOL) 50000 UNITS CAPS, Take 50,000 Units by mouth., Disp: , Rfl:   EXAM:  Filed Vitals:   09/09/14 1109  BP: 126/82  Pulse: 84  Temp: 98.6 F (37 C)    Body mass index is 27.72 kg/(m^2).  GENERAL: vitals reviewed and listed above, alert, oriented, appears well hydrated and in no acute distress  HEENT: atraumatic, conjunttiva clear, no obvious abnormalities on inspection of external nose and ears  NECK: no obvious masses on inspection  MS: moves all extremities without noticeable abnormality  SKIN: mobile subcutaneous nodule on her R mid back approx 1.5-2 cm in diameter  PSYCH: pleasant and cooperative, no obvious depression or anxiety  ASSESSMENT AND PLAN:  Discussed the following assessment and plan:  Mass on back - Plan: Ambulatory referral to General Surgery  -likely lipoma or cysts, she desires removal for conformation dx and is causing her discomfort -due to size and location she opted to see gen surg -referral placed -Patient advised to return or notify a doctor immediately if symptoms worsen or persist or new concerns arise.  Patient Instructions  -We placed a referral for you as discussed . It usually takes about 1-2 weeks to  process and schedule this referral. If you have not heard from [place] regarding this appointment in 2 weeks please contact our office.      Colin Benton R.

## 2014-09-09 NOTE — Patient Instructions (Signed)
-  We placed a referral for you as discussed. It usually takes about 1-2 weeks to process and schedule this referral. If you have not heard from us regarding this appointment in 2 weeks please contact our office.  

## 2014-09-11 ENCOUNTER — Encounter: Payer: Self-pay | Admitting: Internal Medicine

## 2014-09-11 ENCOUNTER — Ambulatory Visit (INDEPENDENT_AMBULATORY_CARE_PROVIDER_SITE_OTHER): Payer: PPO | Admitting: Internal Medicine

## 2014-09-11 VITALS — BP 138/90 | HR 72 | Ht 59.0 in | Wt 138.4 lb

## 2014-09-11 DIAGNOSIS — K219 Gastro-esophageal reflux disease without esophagitis: Secondary | ICD-10-CM | POA: Diagnosis not present

## 2014-09-11 NOTE — Progress Notes (Signed)
Diana [name] 05/26/1950 [id number]  Note: This dictation was prepared with Dragon digital system. Any transcriptional errors that result from this procedure are unintentional.   History of Present Illness: This is a 64 year old white female with gastroesophageal reflux disease and history of reflux esophagitis on  endoscopy in October 2004. She has presented with  refractory gastroesophageal reflux and underwent  upper endoscopy in July 2016 which was essentially normal. Her antireflux regimen was modified  to Prilosec 40 mg in the morning and ranitidine 300 mg at bedtime, which has resulted in marked improvement. She has modified her diet to decrease the size of the evening meals and is following strict antireflux measures. She denies dysphagia, odynophagia, nocturnal cough or hoarseness. There is a history of perforated diverticulitis in 2009, she underwent sigmoid resection. Last colonoscopy in 2009. Next colonoscopy in 2019. There is a positive family history of gallbladder disease in maternal grandmother. Patient wonders if some of her symptoms may not be related to biliary dysfunction    Past Medical History  Diagnosis Date  . Diverticulosis   . GERD (gastroesophageal reflux disease)   . DJD (degenerative joint disease)   . ADD (attention deficit disorder)   . Mood disorder     Past Surgical History  Procedure Laterality Date  . Total abdominal hysterectomy    . Hemorrhoid surgery    . Rhinoplasty      x 2  . Pilonidal cyst excision    . Placement of breast implants    . Breast implant removal    . Shoulder surgery Right   . Partial colectomy      Allergies  Allergen Reactions  . Ezetimibe     REACTION: statins make joints ache  . Latex Hives  . Morphine Sulfate     REACTION: n \\T \ v    Family history and social history have been reviewed.  Review of Systems: Occasional dyspepsia. Denies abdominal pain diarrhea weight loss  The remainder of the 10 point ROS is  negative except as outlined in the H&P  Physical Exam: General Appearance Well developed, in no distress Eyes  Non icteric  HEENT  Non traumatic, normocephalic  Mouth No lesion, tongue papillated, no cheilosis Neck Supple without adenopathy, thyroid not enlarged, no carotid bruits, no JVD Lungs Clear to auscultation bilaterally COR Normal S1, normal S2, regular rhythm, no murmur, quiet precordium Abdomen soft minimally tender in right upper quadrant. Lower abdomen unremarkable. Normoactive bowel sounds Rectal not done Extremities  No pedal edema Skin No lesions Neurological Alert and oriented x 3 Psychological Normal mood and affect  Assessment and Plan:   64 year old white female with gastroesophageal reflux disease currently well controlled on Prilosec 40 mg in the morning and ranitidine 300 mg at bedtime. She will continue strict antireflux measures. We will proceed with upper abdominal ultrasound. In the future she may try to all stop omeprazole because of her concern for side effects. She may substitute ranitidine for the morning dose of omeprazole  Colorectal screening. Recall colonoscopy in 2019  Rectal leakage. She used hydrocortisone suppository without much result. Overall symptoms have improved  Delfin Edis 09/11/2014

## 2014-09-11 NOTE — Patient Instructions (Addendum)
You have been scheduled for an abdominal ultrasound at [facility] (1st floor of hospital) on 09/16/2014 at 9:00am. Please arrive 15 minutes prior to your appointment for registration. Make certain not to have anything to eat or drink 6 hours prior to your appointment. Should you need to reschedule your appointment, please contact radiology at [phone]. This test typically takes about 30 minutes to perform. Dr Sherren Mocha

## 2014-09-16 ENCOUNTER — Ambulatory Visit (HOSPITAL_COMMUNITY)
Admission: RE | Admit: 2014-09-16 | Discharge: 2014-09-16 | Disposition: A | Discharge status: Home / Self Care | Payer: PPO | Source: Ambulatory Visit | Attending: Internal Medicine | Admitting: Internal Medicine

## 2014-09-16 DIAGNOSIS — R1010 Upper abdominal pain, unspecified: Secondary | ICD-10-CM | POA: Insufficient documentation

## 2014-09-16 DIAGNOSIS — K219 Gastro-esophageal reflux disease without esophagitis: Secondary | ICD-10-CM | POA: Diagnosis not present

## 2014-09-16 DIAGNOSIS — R932 Abnormal findings on diagnostic imaging of liver and biliary tract: Secondary | ICD-10-CM | POA: Insufficient documentation

## 2014-09-16 NOTE — Progress Notes (Signed)
Quick Note:  Study suggests fatty liver No gallstones or other problems Seems unlikely fatty liver causing her problems Mail her fatty liver info  F/u GI prn ______

## 2014-09-20 ENCOUNTER — Telehealth: Payer: Self-pay | Admitting: *Deleted

## 2014-09-20 NOTE — Telephone Encounter (Signed)
Patient walked in to the office and informed Tammy she has not heard from anyone about the appt for a general surgeon.  I advised Tammy to have the pt call [facility] at [phone] regarding an appt.

## 2014-10-08 ENCOUNTER — Ambulatory Visit: Payer: Self-pay | Admitting: Surgery

## 2014-10-08 NOTE — H&P (Signed)
History of Present Illness Diana [name]. Makayia Duplessis MD; 10/08/2014 4:25 PM) Patient words: back mass.  The patient is a 64 year old female who presents with a complaint of Mass. PCP - Stevie Kern Referred for evaluation of back mass This is a 64 year old female who presents with a one-month history of a palpable mass in her mid back that is causing some mild discomfort and unusual skin sensation. This area has not really enlarged in the last few weeks. This has never become infected. She presents now for surgical evaluation for removal. Other Problems Marjean Donna, CMA; 10/08/2014 1:44 PM) Anxiety Disorder Arthritis Back Pain Depression Diverticulosis Gastroesophageal Reflux Disease Hemorrhoids High blood pressure Hypercholesterolemia Melanoma Migraine Headache Other disease, cancer, significant illness  Past Surgical History Marjean Donna, CMA; 10/08/2014 1:44 PM) Breast Augmentation Bilateral. Cesarean Section - 1 Colon Removal - Partial Hemorrhoidectomy Hysterectomy (not due to cancer) - Partial Shoulder Surgery Right.  Diagnostic Studies History Marjean Donna, CMA; 10/08/2014 1:44 PM) Colonoscopy 5-10 years ago Mammogram 1-3 years ago Pap Smear 1-5 years ago  Allergies Marjean Donna, CMA; 10/08/2014 1:45 PM) Latex Exam Gloves *MEDICAL DEVICES AND SUPPLIES* Morphine Sulfate (Concentrate) *ANALGESICS - OPIOID*  Medication History (Sonya Bynum, CMA; 10/08/2014 1:47 PM) Omeprazole (40MG  Capsule DR, Oral) Active. Triamterene-HCTZ (37.5-25MG  Capsule, Oral) Active. Hydrocortisone Acetate (25MG  Suppository, Rectal) Active. Adderall (20MG  Tablet, Oral) Active. Flaxseed (Linseed) (1000MG  Capsule, Oral) Active. Anusol-HC (25MG  Suppository, Rectal as needed) Active. Maxzide-25 (37.5-25MG  Tablet, Oral) Active. Vitamin D3 (50000UNIT Tablet, Oral) Active. Medications Reconciled  Social History Marjean Donna, CMA; 10/08/2014 1:44 PM) Alcohol use Occasional  alcohol use. Caffeine use Carbonated beverages, Coffee, Tea. No drug use Tobacco use Former smoker.  Family History Marjean Donna, [place]; 10/08/2014 1:44 PM) Alcohol Abuse Family Members In General, Father. Arthritis Family Members In General, Father, Mother, Sister. Breast Cancer Family Members In General. Cancer Family Members In General. Cerebrovascular Accident Family Members In General. Cervical Cancer Family Members In General. Depression Family Members In General, Mother. Diabetes Mellitus Family Members In General, Father. Heart Disease Family Members In General, Father. Heart disease in female family member before age 36 Hypertension Family Members In General, Father, Mother. Kidney Disease Father. Melanoma Mother. Migraine Headache Family Members In General. Respiratory Condition Mother. Thyroid problems Sister.  Pregnancy / Birth History Marjean Donna, [place]; 10/08/2014 1:44 PM) Age at menarche 52 years. Age of menopause <43 Contraceptive History Oral contraceptives. Gravida 1 Maternal age 63-25 Para 1 Regular periods     Review of Systems ([place]; 10/08/2014 1:44 PM) General Present- Fatigue and Weight Gain. Not Present- Appetite Loss, Chills, Fever, Night Sweats and Weight Loss. Skin Not Present- Change in Wart/Mole, Dryness, Hives, Jaundice, New Lesions, Non-Healing Wounds, Rash and Ulcer. HEENT Present- Ringing in the Ears and Wears glasses/contact lenses. Not Present- Earache, Hearing Loss, Hoarseness, Nose Bleed, Oral Ulcers, Seasonal Allergies, Sinus Pain, Sore Throat, Visual Disturbances and Yellow Eyes. Cardiovascular Not Present- Chest Pain, Difficulty Breathing Lying Down, Leg Cramps, Palpitations, Rapid Heart Rate, Shortness of Breath and Swelling of Extremities. Gastrointestinal Not Present- Abdominal Pain, Bloating, Bloody Stool, Change in Bowel Habits, Chronic diarrhea, Constipation, Difficulty Swallowing, Excessive gas, Gets  full quickly at meals, Hemorrhoids, Indigestion, Nausea, Rectal Pain and Vomiting. Female Genitourinary Present- Urgency. Not Present- Frequency, Nocturia, Painful Urination and Pelvic Pain. Musculoskeletal Present- Joint Pain and Joint Stiffness. Not Present- Back Pain, Muscle Pain, Muscle Weakness and Swelling of Extremities. Neurological Not Present- Decreased Memory, Fainting, Headaches, Numbness, Seizures, Tingling, Tremor, Trouble walking and Weakness. Psychiatric Present- Anxiety  and Depression. Not Present- Bipolar, Change in Sleep Pattern, Fearful and Frequent crying. Endocrine Present- Hair Changes. Not Present- Cold Intolerance, Excessive Hunger, Heat Intolerance, Hot flashes and New Diabetes. Hematology Not Present- Easy Bruising, Excessive bleeding, Gland problems, HIV and Persistent Infections.  Vitals (Sonya Bynum CMA; 10/08/2014 1:45 PM) 10/08/2014 1:44 PM Weight: 139 lb Height: 59in Body Surface Area: 1.62 m Body Mass Index: 28.07 kg/m Temp.: 72F(Temporal)  Pulse: 79 (Regular)  BP: 126/76 (Sitting, Left Arm, Standard)     Physical Exam Rodman Key K. Jhan Conery MD; 10/08/2014 4:25 PM)  The physical exam findings are as follows: Note:WDWN in NAD HEENT: EOMI, sclera anicteric Neck: No masses, no thyromegaly Lungs: CTA bilaterally; normal respiratory effort CV: Regular rate and rhythm; no murmurs Abd: +bowel sounds, soft, non-tender, no masses Ext: Well-perfused; no edema Skin: Warm, dry; no sign of jaundice: mid-back just to the right of midline, there is a 3 cm oval-shaped subcutaneous mass. No overlying inflammation. Smooth, mobile.    Assessment & Plan Diana [name]. Catalina Salasar MD; 10/08/2014 2:16 PM)  LIPOMA OF BACK (D17.1)  Current Plans Schedule for Surgery - Excision of subcutaneous lipoma of the back. The surgical procedure has been discussed with the patient. Potential risks, benefits, alternative treatments, and expected outcomes have been explained. All of  the patient's questions at this time have been answered. The likelihood of reaching the patient's treatment goal is good. The patient understand the proposed surgical procedure and wishes to proceed.   Diana [name]. Georgette Dover, MD, [facility]  General/ Trauma Surgery  10/08/2014 4:26 PM

## 2014-10-21 ENCOUNTER — Encounter (HOSPITAL_COMMUNITY)
Admission: RE | Admit: 2014-10-21 | Discharge: 2014-10-21 | Disposition: A | Discharge status: Home / Self Care | Payer: PPO | Source: Ambulatory Visit | Attending: Surgery | Admitting: Surgery

## 2014-10-21 ENCOUNTER — Other Ambulatory Visit: Payer: Self-pay

## 2014-10-21 ENCOUNTER — Encounter (HOSPITAL_COMMUNITY): Payer: Self-pay

## 2014-10-21 DIAGNOSIS — M1990 Unspecified osteoarthritis, unspecified site: Secondary | ICD-10-CM | POA: Diagnosis not present

## 2014-10-21 DIAGNOSIS — F419 Anxiety disorder, unspecified: Secondary | ICD-10-CM | POA: Diagnosis not present

## 2014-10-21 DIAGNOSIS — E7800 Pure hypercholesterolemia, unspecified: Secondary | ICD-10-CM | POA: Diagnosis not present

## 2014-10-21 DIAGNOSIS — K219 Gastro-esophageal reflux disease without esophagitis: Secondary | ICD-10-CM | POA: Diagnosis not present

## 2014-10-21 DIAGNOSIS — Z8719 Personal history of other diseases of the digestive system: Secondary | ICD-10-CM | POA: Diagnosis not present

## 2014-10-21 DIAGNOSIS — Z85820 Personal history of malignant melanoma of skin: Secondary | ICD-10-CM | POA: Diagnosis not present

## 2014-10-21 DIAGNOSIS — I10 Essential (primary) hypertension: Secondary | ICD-10-CM | POA: Diagnosis not present

## 2014-10-21 DIAGNOSIS — F329 Major depressive disorder, single episode, unspecified: Secondary | ICD-10-CM | POA: Diagnosis not present

## 2014-10-21 DIAGNOSIS — Z91040 Latex allergy status: Secondary | ICD-10-CM | POA: Diagnosis not present

## 2014-10-21 DIAGNOSIS — Z888 Allergy status to other drugs, medicaments and biological substances status: Secondary | ICD-10-CM | POA: Diagnosis not present

## 2014-10-21 DIAGNOSIS — G43909 Migraine, unspecified, not intractable, without status migrainosus: Secondary | ICD-10-CM | POA: Diagnosis not present

## 2014-10-21 DIAGNOSIS — Z885 Allergy status to narcotic agent status: Secondary | ICD-10-CM | POA: Diagnosis not present

## 2014-10-21 DIAGNOSIS — Z87891 Personal history of nicotine dependence: Secondary | ICD-10-CM | POA: Diagnosis not present

## 2014-10-21 DIAGNOSIS — D171 Benign lipomatous neoplasm of skin and subcutaneous tissue of trunk: Secondary | ICD-10-CM | POA: Diagnosis not present

## 2014-10-21 HISTORY — DX: Personal history of other diseases of the digestive system: Z87.19

## 2014-10-21 HISTORY — DX: Essential (primary) hypertension: I10

## 2014-10-21 HISTORY — DX: Malignant (primary) neoplasm, unspecified: C80.1

## 2014-10-21 LAB — CBC
HCT: 40.1 % (ref 36.0–46.0)
HEMOGLOBIN: 13.7 g/dL (ref 12.0–15.0)
MCH: 29.3 pg (ref 26.0–34.0)
MCHC: 34.2 g/dL (ref 30.0–36.0)
MCV: 85.7 fL (ref 78.0–100.0)
Platelets: 210 10*3/uL (ref 150–400)
RBC: 4.68 MIL/uL (ref 3.87–5.11)
RDW: 13.3 % (ref 11.5–15.5)
WBC: 8.2 10*3/uL (ref 4.0–10.5)

## 2014-10-21 LAB — BASIC METABOLIC PANEL
ANION GAP: 12 (ref 5–15)
BUN: 23 mg/dL — ABNORMAL HIGH (ref 6–20)
CALCIUM: 10 mg/dL (ref 8.9–10.3)
CHLORIDE: 100 mmol/L — AB (ref 101–111)
CO2: 25 mmol/L (ref 22–32)
CREATININE: 1.04 mg/dL — AB (ref 0.44–1.00)
GFR calc Af Amer: >60 mL/min (ref >60)
GFR calc non Af Amer: 55 mL/min — ABNORMAL LOW (ref >60)
GLUCOSE: 92 mg/dL (ref 65–99)
Potassium: 3.3 mmol/L — ABNORMAL LOW (ref 3.5–5.1)
Sodium: 137 mmol/L (ref 135–145)

## 2014-10-21 NOTE — Pre-Procedure Instructions (Addendum)
Diana [name]  10/21/2014      [id number] [address], [place] [address] [place] [address] Phone: [phone] Fax: [phone]    Your procedure is scheduled on 10/23/14.  Report to [facility] short stay admitting at 1215 A.M.  Call this number if you have problems the morning of surgery:  [phone]   Remember:  Do not eat food or drink liquids after midnight.  Take these medicines the morning of surgery with A SIP OF WATER prilosec    STOP all herbel meds, nsaids (aleve,naproxen,advil,ibuprofen) starting today including ASPIRIN, VITAMINS   Do not wear jewelry, make-up or nail polish.  Do not wear lotions, powders, or perfumes.  You may wear deodorant.  Do not shave 48 hours prior to surgery.  Men may shave face and neck.  Do not bring valuables to the hospital.  [facility] is not responsible for any belongings or valuables.  Contacts, dentures or bridgework may not be worn into surgery.  Leave your suitcase in the car.  After surgery it may be brought to your room.  For patients admitted to the hospital, discharge time will be determined by your treatment team.  Patients discharged the day of surgery will not be allowed to drive home.   Name and phone number of your driver:    Special instructions:   Special Instructions: [place] - Preparing for Surgery  Before surgery, you can play an important role.  Because skin is not sterile, your skin needs to be as free of germs as possible.  You can reduce the number of germs on you skin by washing with CHG (chlorahexidine gluconate) soap before surgery.  CHG is an antiseptic cleaner which kills germs and bonds with the skin to continue killing germs even after washing.  Please DO NOT use if you have an allergy to CHG or antibacterial soaps.  If your skin becomes reddened/irritated stop using the CHG and inform your nurse when you arrive at Short Stay.  Do not shave (including legs and  underarms) for at least 48 hours prior to the first CHG shower.  You may shave your face.  Please follow these instructions carefully:   1.  Shower with CHG Soap the night before surgery and the morning of Surgery.  2.  If you choose to wash your hair, wash your hair first as usual with your normal shampoo.  3.  After you shampoo, rinse your hair and body thoroughly to remove the Shampoo.  4.  Use CHG as you would any other liquid soap.  You can apply chg directly  to the skin and wash gently with scrungie or a clean washcloth.  5.  Apply the CHG Soap to your body ONLY FROM THE NECK DOWN.  Do not use on open wounds or open sores.  Avoid contact with your eyes ears, mouth and genitals (private parts).  Wash genitals (private parts)       with your normal soap.  6.  Wash thoroughly, paying special attention to the area where your surgery will be performed.  7.  Thoroughly rinse your body with warm water from the neck down.  8.  DO NOT shower/wash with your normal soap after using and rinsing off the CHG Soap.  9.  Pat yourself dry with a clean towel.            10.  Wear clean pajamas.  11.  Place clean sheets on your bed the night of your first shower and do not sleep with pets.  Day of Surgery  Do not apply any lotions/deodorants the morning of surgery.  Please wear clean clothes to the hospital/surgery center.  Please read over the following fact sheets that you were given. Pain Booklet, Coughing and Deep Breathing and Surgical Site Infection Prevention

## 2014-10-22 MED ORDER — CHLORHEXIDINE GLUCONATE 4 % EX LIQD: 1.0000 "application " | Freq: Once | CUTANEOUS | Status: DC

## 2014-10-23 ENCOUNTER — Encounter (HOSPITAL_COMMUNITY): Payer: Self-pay | Admitting: General Practice

## 2014-10-23 ENCOUNTER — Ambulatory Visit (HOSPITAL_COMMUNITY): Payer: PPO | Admitting: Vascular Surgery

## 2014-10-23 ENCOUNTER — Encounter (HOSPITAL_COMMUNITY)
Admission: RE | Disposition: A | Discharge status: Home / Self Care | Payer: Self-pay | Source: Ambulatory Visit | Attending: Surgery

## 2014-10-23 ENCOUNTER — Ambulatory Visit (HOSPITAL_COMMUNITY)
Admission: RE | Admit: 2014-10-23 | Discharge: 2014-10-23 | Disposition: A | Discharge status: Home / Self Care | Payer: PPO | Source: Ambulatory Visit | Attending: Surgery | Admitting: Surgery

## 2014-10-23 ENCOUNTER — Ambulatory Visit (HOSPITAL_COMMUNITY): Payer: PPO | Admitting: Anesthesiology

## 2014-10-23 DIAGNOSIS — Z85820 Personal history of malignant melanoma of skin: Secondary | ICD-10-CM | POA: Insufficient documentation

## 2014-10-23 DIAGNOSIS — M1990 Unspecified osteoarthritis, unspecified site: Secondary | ICD-10-CM | POA: Diagnosis not present

## 2014-10-23 DIAGNOSIS — Z888 Allergy status to other drugs, medicaments and biological substances status: Secondary | ICD-10-CM | POA: Insufficient documentation

## 2014-10-23 DIAGNOSIS — F419 Anxiety disorder, unspecified: Secondary | ICD-10-CM | POA: Insufficient documentation

## 2014-10-23 DIAGNOSIS — E7800 Pure hypercholesterolemia, unspecified: Secondary | ICD-10-CM | POA: Insufficient documentation

## 2014-10-23 DIAGNOSIS — D171 Benign lipomatous neoplasm of skin and subcutaneous tissue of trunk: Secondary | ICD-10-CM | POA: Diagnosis not present

## 2014-10-23 DIAGNOSIS — Z87891 Personal history of nicotine dependence: Secondary | ICD-10-CM | POA: Insufficient documentation

## 2014-10-23 DIAGNOSIS — K219 Gastro-esophageal reflux disease without esophagitis: Secondary | ICD-10-CM | POA: Insufficient documentation

## 2014-10-23 DIAGNOSIS — F329 Major depressive disorder, single episode, unspecified: Secondary | ICD-10-CM | POA: Diagnosis not present

## 2014-10-23 DIAGNOSIS — G43909 Migraine, unspecified, not intractable, without status migrainosus: Secondary | ICD-10-CM | POA: Insufficient documentation

## 2014-10-23 DIAGNOSIS — I10 Essential (primary) hypertension: Secondary | ICD-10-CM | POA: Insufficient documentation

## 2014-10-23 DIAGNOSIS — Z885 Allergy status to narcotic agent status: Secondary | ICD-10-CM | POA: Insufficient documentation

## 2014-10-23 DIAGNOSIS — Z91040 Latex allergy status: Secondary | ICD-10-CM | POA: Insufficient documentation

## 2014-10-23 DIAGNOSIS — Z8719 Personal history of other diseases of the digestive system: Secondary | ICD-10-CM | POA: Insufficient documentation

## 2014-10-23 HISTORY — PX: LIPOMA EXCISION: SHX5283

## 2014-10-23 SURGERY — EXCISION LIPOMA
Anesthesia: General | Site: Back

## 2014-10-23 MED ORDER — SUCCINYLCHOLINE CHLORIDE 20 MG/ML IJ SOLN
INTRAMUSCULAR | Status: AC
  Filled 2014-10-23: qty 1

## 2014-10-23 MED ORDER — LIDOCAINE HCL (CARDIAC) 20 MG/ML IV SOLN
INTRAVENOUS | Status: AC
  Filled 2014-10-23: qty 5

## 2014-10-23 MED ORDER — 0.9 % SODIUM CHLORIDE (POUR BTL) OPTIME
TOPICAL | Status: DC | PRN
  Administered 2014-10-23: 1000 mL

## 2014-10-23 MED ORDER — LACTATED RINGERS IV SOLN
INTRAVENOUS | Status: DC
  Administered 2014-10-23: 14:00:00 via INTRAVENOUS

## 2014-10-23 MED ORDER — DEXAMETHASONE SODIUM PHOSPHATE 10 MG/ML IJ SOLN
INTRAMUSCULAR | Status: AC
  Filled 2014-10-23: qty 1

## 2014-10-23 MED ORDER — FENTANYL CITRATE (PF) 100 MCG/2ML IJ SOLN
INTRAMUSCULAR | Status: DC | PRN
  Administered 2014-10-23: 50 ug via INTRAVENOUS

## 2014-10-23 MED ORDER — BUPIVACAINE-EPINEPHRINE (PF) 0.25% -1:200000 IJ SOLN
INTRAMUSCULAR | Status: AC
  Filled 2014-10-23: qty 30

## 2014-10-23 MED ORDER — HYDROMORPHONE HCL 1 MG/ML IJ SOLN: 1.0000 mg | INTRAMUSCULAR | Status: DC | PRN

## 2014-10-23 MED ORDER — MIDAZOLAM HCL 2 MG/2ML IJ SOLN
INTRAMUSCULAR | Status: AC
  Filled 2014-10-23: qty 2

## 2014-10-23 MED ORDER — DEXAMETHASONE SODIUM PHOSPHATE 10 MG/ML IJ SOLN
INTRAMUSCULAR | Status: DC | PRN
  Administered 2014-10-23: 10 mg via INTRAVENOUS

## 2014-10-23 MED ORDER — PROPOFOL 10 MG/ML IV BOLUS
INTRAVENOUS | Status: DC | PRN
  Administered 2014-10-23: 140 mg via INTRAVENOUS

## 2014-10-23 MED ORDER — PROPOFOL 10 MG/ML IV BOLUS
INTRAVENOUS | Status: AC
  Filled 2014-10-23: qty 20

## 2014-10-23 MED ORDER — ONDANSETRON HCL 4 MG/2ML IJ SOLN
INTRAMUSCULAR | Status: AC
  Filled 2014-10-23: qty 2

## 2014-10-23 MED ORDER — HYDROCODONE-ACETAMINOPHEN 5-325 MG PO TABS: 1.0000 | ORAL_TABLET | ORAL | Status: DC | PRN

## 2014-10-23 MED ORDER — FENTANYL CITRATE (PF) 250 MCG/5ML IJ SOLN
INTRAMUSCULAR | Status: AC
  Filled 2014-10-23: qty 5

## 2014-10-23 MED ORDER — BUPIVACAINE-EPINEPHRINE 0.25% -1:200000 IJ SOLN
INTRAMUSCULAR | Status: DC | PRN
  Administered 2014-10-23: 10 mL

## 2014-10-23 MED ORDER — MIDAZOLAM HCL 5 MG/5ML IJ SOLN
INTRAMUSCULAR | Status: DC | PRN
  Administered 2014-10-23: 1 mg via INTRAVENOUS

## 2014-10-23 MED ORDER — PHENYLEPHRINE HCL 10 MG/ML IJ SOLN
INTRAMUSCULAR | Status: DC | PRN
  Administered 2014-10-23 (×4): 80 ug via INTRAVENOUS

## 2014-10-23 MED ORDER — HYDROCODONE-ACETAMINOPHEN 5-325 MG PO TABS
1.0000 | ORAL_TABLET | ORAL | Status: DC | PRN
  Filled 2014-10-23: qty 2

## 2014-10-23 MED ORDER — ONDANSETRON HCL 4 MG/2ML IJ SOLN
INTRAMUSCULAR | Status: DC | PRN
  Administered 2014-10-23: 4 mg via INTRAVENOUS

## 2014-10-23 MED ORDER — LIDOCAINE HCL (CARDIAC) 20 MG/ML IV SOLN
INTRAVENOUS | Status: DC | PRN
  Administered 2014-10-23: 60 mg via INTRAVENOUS

## 2014-10-23 MED ORDER — LACTATED RINGERS IV SOLN
INTRAVENOUS | Status: DC | PRN
  Administered 2014-10-23 (×2): via INTRAVENOUS

## 2014-10-23 MED ORDER — ONDANSETRON HCL 4 MG/2ML IJ SOLN
4.0000 mg | INTRAMUSCULAR | Status: DC | PRN
  Filled 2014-10-23: qty 2

## 2014-10-23 SURGICAL SUPPLY — 51 items
APL SKNCLS STERI-STRIP NONHPOA (GAUZE/BANDAGES/DRESSINGS) ×1
BENZOIN TINCTURE PRP APPL 2/3 (GAUZE/BANDAGES/DRESSINGS) ×3 IMPLANT
BLADE SURG 10 STRL SS (BLADE) ×3 IMPLANT
BLADE SURG 15 STRL LF DISP TIS (BLADE) ×1 IMPLANT
BLADE SURG 15 STRL SS (BLADE) ×3
BLADE SURG ROTATE 9660 (MISCELLANEOUS) IMPLANT
CHLORAPREP W/TINT 26ML (MISCELLANEOUS) ×3 IMPLANT
CLOSURE WOUND 1/2 X4 (GAUZE/BANDAGES/DRESSINGS) ×1
COVER SURGICAL LIGHT HANDLE (MISCELLANEOUS) ×3 IMPLANT
DRAPE LAPAROTOMY T 98X78 PEDS (DRAPES) ×3 IMPLANT
DRAPE UTILITY XL STRL (DRAPES) ×3 IMPLANT
DRSG TEGADERM 2-3/8X2-3/4 SM (GAUZE/BANDAGES/DRESSINGS) ×2 IMPLANT
ELECT CAUTERY BLADE 6.4 (BLADE) ×3 IMPLANT
ELECT REM PT RETURN 9FT ADLT (ELECTROSURGICAL) ×3
ELECTRODE REM PT RTRN 9FT ADLT (ELECTROSURGICAL) ×1 IMPLANT
GAUZE SPONGE 2X2 8PLY STRL LF (GAUZE/BANDAGES/DRESSINGS) IMPLANT
GAUZE SPONGE 4X4 12PLY STRL (GAUZE/BANDAGES/DRESSINGS) ×3 IMPLANT
GLOVE BIO SURGEON STRL SZ7 (GLOVE) ×1 IMPLANT
GLOVE BIOGEL PI IND STRL 7.0 (GLOVE) IMPLANT
GLOVE BIOGEL PI IND STRL 7.5 (GLOVE) ×1 IMPLANT
GLOVE BIOGEL PI INDICATOR 7.0 (GLOVE) ×4
GLOVE BIOGEL PI INDICATOR 7.5 (GLOVE) ×2
GLOVE SURG SS PI 7.0 STRL IVOR (GLOVE) ×6 IMPLANT
GLOVE SURG SS PI 7.5 STRL IVOR (GLOVE) ×2 IMPLANT
GOWN STRL REUS W/ TWL LRG LVL3 (GOWN DISPOSABLE) ×2 IMPLANT
GOWN STRL REUS W/TWL LRG LVL3 (GOWN DISPOSABLE) ×6
KIT BASIN OR (CUSTOM PROCEDURE TRAY) ×3 IMPLANT
KIT ROOM TURNOVER OR (KITS) ×3 IMPLANT
MARKER SKIN DUAL TIP RULER LAB (MISCELLANEOUS) ×2 IMPLANT
NDL HYPO 25GX1X1/2 BEV (NEEDLE) ×1 IMPLANT
NEEDLE HYPO 25GX1X1/2 BEV (NEEDLE) ×3 IMPLANT
NS IRRIG 1000ML POUR BTL (IV SOLUTION) ×3 IMPLANT
PACK SURGICAL SETUP 50X90 (CUSTOM PROCEDURE TRAY) ×3 IMPLANT
PAD ARMBOARD 7.5X6 YLW CONV (MISCELLANEOUS) ×3 IMPLANT
PENCIL BUTTON HOLSTER BLD 10FT (ELECTRODE) ×3 IMPLANT
SPECIMEN JAR MEDIUM (MISCELLANEOUS) ×2 IMPLANT
SPECIMEN JAR SMALL (MISCELLANEOUS) ×1 IMPLANT
SPONGE GAUZE 2X2 STER 10/PKG (GAUZE/BANDAGES/DRESSINGS) ×2
SPONGE LAP 18X18 X RAY DECT (DISPOSABLE) ×3 IMPLANT
STRIP CLOSURE SKIN 1/2X4 (GAUZE/BANDAGES/DRESSINGS) ×2 IMPLANT
SUT MNCRL AB 4-0 PS2 18 (SUTURE) ×3 IMPLANT
SUT VIC AB 2-0 SH 27 (SUTURE)
SUT VIC AB 2-0 SH 27X BRD (SUTURE) IMPLANT
SUT VIC AB 3-0 SH 27 (SUTURE) ×3
SUT VIC AB 3-0 SH 27XBRD (SUTURE) ×1 IMPLANT
SYR BULB 3OZ (MISCELLANEOUS) ×3 IMPLANT
SYR CONTROL 10ML LL (SYRINGE) ×3 IMPLANT
TOWEL OR 17X24 6PK STRL BLUE (TOWEL DISPOSABLE) ×3 IMPLANT
TOWEL OR 17X26 10 PK STRL BLUE (TOWEL DISPOSABLE) ×3 IMPLANT
TUBE CONNECTING 12'X1/4 (SUCTIONS) ×1
TUBE CONNECTING 12X1/4 (SUCTIONS) ×1 IMPLANT

## 2014-10-23 NOTE — Interval H&P Note (Signed)
History and Physical Interval Note:  10/23/2014 1:30 PM  Diana [name]  has presented today for surgery, with the diagnosis of Back Lipoma  The various methods of treatment have been discussed with the patient and family. After consideration of risks, benefits and other options for treatment, the patient has consented to  Procedure(s): EXCISION OF BACK LIPOMA (N/A) as a surgical intervention .  The patient's history has been reviewed, patient examined, no change in status, stable for surgery.  I have reviewed the patient's chart and labs.  Questions were answered to the patient's satisfaction.     Ferdinand Revoir K.

## 2014-10-23 NOTE — H&P (View-Only) (Signed)
History of Present Illness Diana [name]. Tsuei MD; 10/08/2014 4:25 PM) Patient words: back mass.  The patient is a 64 year old female who presents with a complaint of Mass. PCP - Stevie Kern Referred for evaluation of back mass This is a 64 year old female who presents with a one-month history of a palpable mass in her mid back that is causing some mild discomfort and unusual skin sensation. This area has not really enlarged in the last few weeks. This has never become infected. She presents now for surgical evaluation for removal. Other Problems Marjean Donna, CMA; 10/08/2014 1:44 PM) Anxiety Disorder Arthritis Back Pain Depression Diverticulosis Gastroesophageal Reflux Disease Hemorrhoids High blood pressure Hypercholesterolemia Melanoma Migraine Headache Other disease, cancer, significant illness  Past Surgical History Marjean Donna, CMA; 10/08/2014 1:44 PM) Breast Augmentation Bilateral. Cesarean Section - 1 Colon Removal - Partial Hemorrhoidectomy Hysterectomy (not due to cancer) - Partial Shoulder Surgery Right.  Diagnostic Studies History Marjean Donna, CMA; 10/08/2014 1:44 PM) Colonoscopy 5-10 years ago Mammogram 1-3 years ago Pap Smear 1-5 years ago  Allergies Marjean Donna, CMA; 10/08/2014 1:45 PM) Latex Exam Gloves *MEDICAL DEVICES AND SUPPLIES* Morphine Sulfate (Concentrate) *ANALGESICS - OPIOID*  Medication History (Sonya Bynum, CMA; 10/08/2014 1:47 PM) Omeprazole (40MG  Capsule DR, Oral) Active. Triamterene-HCTZ (37.5-25MG  Capsule, Oral) Active. Hydrocortisone Acetate (25MG  Suppository, Rectal) Active. Adderall (20MG  Tablet, Oral) Active. Flaxseed (Linseed) (1000MG  Capsule, Oral) Active. Anusol-HC (25MG  Suppository, Rectal as needed) Active. Maxzide-25 (37.5-25MG  Tablet, Oral) Active. Vitamin D3 (50000UNIT Tablet, Oral) Active. Medications Reconciled  Social History Marjean Donna, CMA; 10/08/2014 1:44 PM) Alcohol use Occasional  alcohol use. Caffeine use Carbonated beverages, Coffee, Tea. No drug use Tobacco use Former smoker.  Family History Marjean Donna, [place]; 10/08/2014 1:44 PM) Alcohol Abuse Family Members In General, Father. Arthritis Family Members In General, Father, Mother, Sister. Breast Cancer Family Members In General. Cancer Family Members In General. Cerebrovascular Accident Family Members In General. Cervical Cancer Family Members In General. Depression Family Members In General, Mother. Diabetes Mellitus Family Members In General, Father. Heart Disease Family Members In General, Father. Heart disease in female family member before age 21 Hypertension Family Members In General, Father, Mother. Kidney Disease Father. Melanoma Mother. Migraine Headache Family Members In General. Respiratory Condition Mother. Thyroid problems Sister.  Pregnancy / Birth History Marjean Donna, [place]; 10/08/2014 1:44 PM) Age at menarche 49 years. Age of menopause <43 Contraceptive History Oral contraceptives. Gravida 1 Maternal age 29-25 Para 1 Regular periods     Review of Systems ([place]; 10/08/2014 1:44 PM) General Present- Fatigue and Weight Gain. Not Present- Appetite Loss, Chills, Fever, Night Sweats and Weight Loss. Skin Not Present- Change in Wart/Mole, Dryness, Hives, Jaundice, New Lesions, Non-Healing Wounds, Rash and Ulcer. HEENT Present- Ringing in the Ears and Wears glasses/contact lenses. Not Present- Earache, Hearing Loss, Hoarseness, Nose Bleed, Oral Ulcers, Seasonal Allergies, Sinus Pain, Sore Throat, Visual Disturbances and Yellow Eyes. Cardiovascular Not Present- Chest Pain, Difficulty Breathing Lying Down, Leg Cramps, Palpitations, Rapid Heart Rate, Shortness of Breath and Swelling of Extremities. Gastrointestinal Not Present- Abdominal Pain, Bloating, Bloody Stool, Change in Bowel Habits, Chronic diarrhea, Constipation, Difficulty Swallowing, Excessive gas, Gets  full quickly at meals, Hemorrhoids, Indigestion, Nausea, Rectal Pain and Vomiting. Female Genitourinary Present- Urgency. Not Present- Frequency, Nocturia, Painful Urination and Pelvic Pain. Musculoskeletal Present- Joint Pain and Joint Stiffness. Not Present- Back Pain, Muscle Pain, Muscle Weakness and Swelling of Extremities. Neurological Not Present- Decreased Memory, Fainting, Headaches, Numbness, Seizures, Tingling, Tremor, Trouble walking and Weakness. Psychiatric Present- Anxiety  and Depression. Not Present- Bipolar, Change in Sleep Pattern, Fearful and Frequent crying. Endocrine Present- Hair Changes. Not Present- Cold Intolerance, Excessive Hunger, Heat Intolerance, Hot flashes and New Diabetes. Hematology Not Present- Easy Bruising, Excessive bleeding, Gland problems, HIV and Persistent Infections.  Vitals (Sonya Bynum CMA; 10/08/2014 1:45 PM) 10/08/2014 1:44 PM Weight: 139 lb Height: 59in Body Surface Area: 1.62 m Body Mass Index: 28.07 kg/m Temp.: 39F(Temporal)  Pulse: 79 (Regular)  BP: 126/76 (Sitting, Left Arm, Standard)     Physical Exam Rodman Key K. Tsuei MD; 10/08/2014 4:25 PM)  The physical exam findings are as follows: Note:WDWN in NAD HEENT: EOMI, sclera anicteric Neck: No masses, no thyromegaly Lungs: CTA bilaterally; normal respiratory effort CV: Regular rate and rhythm; no murmurs Abd: +bowel sounds, soft, non-tender, no masses Ext: Well-perfused; no edema Skin: Warm, dry; no sign of jaundice: mid-back just to the right of midline, there is a 3 cm oval-shaped subcutaneous mass. No overlying inflammation. Smooth, mobile.    Assessment & Plan Diana [name]. Tsuei MD; 10/08/2014 2:16 PM)  LIPOMA OF BACK (D17.1)  Current Plans Schedule for Surgery - Excision of subcutaneous lipoma of the back. The surgical procedure has been discussed with the patient. Potential risks, benefits, alternative treatments, and expected outcomes have been explained. All of  the patient's questions at this time have been answered. The likelihood of reaching the patient's treatment goal is good. The patient understand the proposed surgical procedure and wishes to proceed.   Diana [name]. Georgette Dover, MD, [facility]  General/ Trauma Surgery  10/08/2014 4:26 PM

## 2014-10-23 NOTE — Anesthesia Preprocedure Evaluation (Signed)
Anesthesia Evaluation  Patient identified by MRN, date of birth, ID band Patient awake    Reviewed: Allergy & Precautions, NPO status , Patient's Chart, lab work & pertinent test results  Airway Mallampati: II  TM Distance: >3 FB Neck ROM: Full    Dental no notable dental hx.    Pulmonary neg pulmonary ROS, former smoker,    Pulmonary exam normal breath sounds clear to auscultation       Cardiovascular hypertension, Pt. on medications Normal cardiovascular exam Rhythm:Regular Rate:Normal     Neuro/Psych negative neurological ROS  negative psych ROS   GI/Hepatic Neg liver ROS, GERD  Medicated,  Endo/Other  negative endocrine ROS  Renal/GU negative Renal ROS  negative genitourinary   Musculoskeletal negative musculoskeletal ROS (+)   Abdominal   Peds negative pediatric ROS (+)  Hematology negative hematology ROS (+)   Anesthesia Other Findings   Reproductive/Obstetrics negative OB ROS                             Anesthesia Physical Anesthesia Plan  ASA: II  Anesthesia Plan: General   Post-op Pain Management:    Induction: Intravenous  Airway Management Planned: Oral ETT  Additional Equipment:   Intra-op Plan:   Post-operative Plan: Extubation in OR  Informed Consent: I have reviewed the patients History and Physical, chart, labs and discussed the procedure including the risks, benefits and alternatives for the proposed anesthesia with the patient or authorized representative who has indicated his/her understanding and acceptance.   Dental advisory given  Plan Discussed with: CRNA and Surgeon  Anesthesia Plan Comments:         Anesthesia Quick Evaluation

## 2014-10-23 NOTE — Anesthesia Procedure Notes (Signed)
Procedure Name: Intubation Date/Time: 10/23/2014 1:59 PM Performed by: Izora Gala Pre-anesthesia Checklist: Patient identified, Emergency Drugs available, Suction available and Patient being monitored Patient Re-evaluated:Patient Re-evaluated prior to inductionOxygen Delivery Method: Circle system utilized Preoxygenation: Pre-oxygenation with 100% oxygen Intubation Type: IV induction Ventilation: Mask ventilation without difficulty Laryngoscope Size: Miller and 3 Grade View: Grade II Tube type: Oral Tube size: 7.0 mm Number of attempts: 1 Airway Equipment and Method: Stylet and LTA kit utilized Placement Confirmation: ETT inserted through vocal cords under direct vision,  positive ETCO2 and breath sounds checked- equal and bilateral

## 2014-10-23 NOTE — Op Note (Signed)
Preop diagnosis: Subcutaneous lipoma of the back 2 cm Postop diagnosis: Same Procedure performed: Excision of subcutaneous lipoma of the back Surgeon: Kemani Heidel K. Anesthesia: Gen. Indications: This is a 15 showed female who presents with an enlarging mass on her back. This has become mildly uncomfortable. She was examined and this was felt to represent a subcutaneous lipoma. She presents now for excision.  Description of procedure:  The patient is brought to the operating room and placed in supine position on the operating room table on a beanbag. After an adequate level of general anesthesia was obtained she was turned on her left side and appropriate padding was placed. The beanbag was used to secure the patient to the bed. Her back was prepped with ChloraPrep inject sterile fashion. A timeout was taken to ensure the proper patient and proper procedure. We infiltrated the area of any visible palpable mass with 0.25% Marcaine with epinephrine. An incision was made extending about 2 and half centimeters. Dissection was carried down through the dermis and subcutaneous fat to the surface lipoma. We bluntly dissected around the lipoma. We dissected the lipoma off of the underlying muscle with cautery. This was sent for pathologic examination. The wound was inspected and no other lipomas were noted. Hemostasis was good. The wound was irrigated and closed with 3-0 Vicryl and 4 Monocryl. Steri-Strips and clean dressings were applied. The patient was then extubated and brought to recovery room in stable condition. All sponge, initially, and needle counts are correct.  Imogene Burn. Georgette Dover, MD, [facility]  General/ Trauma Surgery  10/23/2014 2:48 PM

## 2014-10-23 NOTE — Discharge Instructions (Signed)
[place] Office Phone Number [phone] Lipoma Excision: POST OP INSTRUCTIONS  Always review your discharge instruction sheet given to you by the facility where your surgery was performed.  IF YOU HAVE DISABILITY OR FAMILY LEAVE FORMS, YOU MUST BRING THEM TO THE OFFICE FOR PROCESSING.  DO NOT GIVE THEM TO YOUR DOCTOR.  1. A prescription for pain medication may be given to you upon discharge.  Take your pain medication as prescribed, if needed.  If narcotic pain medicine is not needed, then you may take acetaminophen (Tylenol) or ibuprofen (Advil) as needed. 2. Take your usually prescribed medications unless otherwise directed 3. If you need a refill on your pain medication, please contact your pharmacy.  They will contact our office to request authorization.  Prescriptions will not be filled after 5pm or on week-ends. 4. You should eat very light the first 24 hours after surgery, such as soup, crackers, pudding, etc.  Resume your normal diet the day after surgery. 5. Most patients will experience some swelling and bruising in the breast.  Ice packs will help.  Swelling and bruising can take several days to resolve.  6. It is common to experience some constipation if taking pain medication after surgery.  Increasing fluid intake and taking a stool softener will usually help or prevent this problem from occurring.  A mild laxative (Milk of Magnesia or Miralax) should be taken according to package directions if there are no bowel movements after 48 hours. 7. Unless discharge instructions indicate otherwise, you may remove your bandages 24-48 hours after surgery, and you may shower at that time.  You may have steri-strips (small skin tapes) in place directly over the incision.  These strips should be left on the skin for 7-10 days.   8. ACTIVITIES:  You may resume regular daily activities (gradually increasing) beginning the next day.    You may have sexual intercourse when it is  comfortable. a. You may drive when you no longer are taking prescription pain medication, you can comfortably wear a seatbelt, and you can safely maneuver your car and apply brakes. b. RETURN TO WORK:  ______________________________________________________________________________________ 9. You should see your doctor in the office for a follow-up appointment approximately two weeks after your surgery.   10. OTHER INSTRUCTIONS: _______________________________________________________________________________________________ _____________________________________________________________________________________________________________________________________ _____________________________________________________________________________________________________________________________________ _____________________________________________________________________________________________________________________________________  WHEN TO CALL YOUR DOCTOR: 1. Fever over 101.0 2. Nausea and/or vomiting. 3. Extreme swelling or bruising. 4. Continued bleeding from incision. 5. Increased pain, redness, or drainage from the incision.  The clinic staff is available to answer your questions during regular business hours.  Please dont hesitate to call and ask to speak to one of the nurses for clinical concerns.  If you have a medical emergency, go to the nearest emergency room or call 911.  A surgeon from [facility] is always on call at the hospital.  For further questions, please visit [email or web address]

## 2014-10-23 NOTE — Anesthesia Postprocedure Evaluation (Signed)
  Anesthesia Post-op Note  Patient: Diana [name]  Procedure(s) Performed: Procedure(s): EXCISION OF BACK LIPOMA (N/A)  Patient Location: PACU  Anesthesia Type: General   Level of Consciousness: awake, alert  and oriented  Airway and Oxygen Therapy: Patient Spontanous Breathing  Post-op Pain: none  Post-op Assessment: Post-op Vital signs reviewed  Post-op Vital Signs: Reviewed  Last Vitals:  Filed Vitals:   10/23/14 1539  BP: 115/85  Pulse: 71  Temp:   Resp: 13    Complications: No apparent anesthesia complications

## 2014-10-23 NOTE — Transfer of Care (Signed)
Immediate Anesthesia Transfer of Care Note  Patient: Diana [name]  Procedure(s) Performed: Procedure(s): EXCISION OF BACK LIPOMA (N/A)  Patient Location: PACU  Anesthesia Type:General  Level of Consciousness: awake, alert , oriented and patient cooperative  Airway & Oxygen Therapy: Patient Spontanous Breathing and Patient connected to nasal cannula oxygen  Post-op Assessment: Report given to RN, Post -op Vital signs reviewed and stable, Patient moving all extremities and Patient moving all extremities X 4  Post vital signs: Reviewed and stable  Last Vitals:  Filed Vitals:   10/23/14 1205  BP:   Pulse:   Temp: 36.8 C  Resp:     Complications: No apparent anesthesia complications

## 2014-10-24 ENCOUNTER — Encounter (HOSPITAL_COMMUNITY): Payer: Self-pay | Admitting: Surgery

## 2014-11-20 ENCOUNTER — Other Ambulatory Visit: Payer: Self-pay

## 2014-11-20 DIAGNOSIS — K219 Gastro-esophageal reflux disease without esophagitis: Secondary | ICD-10-CM

## 2014-11-20 MED ORDER — OMEPRAZOLE 40 MG PO CPDR: 40.0000 mg | DELAYED_RELEASE_CAPSULE | Freq: Every day | ORAL | Status: DC

## 2015-01-22 DIAGNOSIS — L814 Other melanin hyperpigmentation: Secondary | ICD-10-CM | POA: Diagnosis not present

## 2015-01-22 DIAGNOSIS — D239 Other benign neoplasm of skin, unspecified: Secondary | ICD-10-CM | POA: Diagnosis not present

## 2015-01-22 DIAGNOSIS — L570 Actinic keratosis: Secondary | ICD-10-CM | POA: Diagnosis not present

## 2015-01-27 ENCOUNTER — Ambulatory Visit: Payer: PPO | Admitting: Gastroenterology

## 2015-02-19 ENCOUNTER — Other Ambulatory Visit: Payer: Self-pay | Admitting: Internal Medicine

## 2015-03-13 ENCOUNTER — Other Ambulatory Visit: Payer: Self-pay | Admitting: Gastroenterology

## 2015-03-19 ENCOUNTER — Other Ambulatory Visit (INDEPENDENT_AMBULATORY_CARE_PROVIDER_SITE_OTHER): Payer: PPO

## 2015-03-19 ENCOUNTER — Ambulatory Visit (INDEPENDENT_AMBULATORY_CARE_PROVIDER_SITE_OTHER): Payer: PPO | Admitting: Gastroenterology

## 2015-03-19 ENCOUNTER — Encounter: Payer: Self-pay | Admitting: Gastroenterology

## 2015-03-19 VITALS — BP 128/70 | HR 68 | Ht 59.0 in | Wt 139.2 lb

## 2015-03-19 DIAGNOSIS — K625 Hemorrhage of anus and rectum: Secondary | ICD-10-CM

## 2015-03-19 DIAGNOSIS — R159 Full incontinence of feces: Secondary | ICD-10-CM

## 2015-03-19 LAB — BASIC METABOLIC PANEL
BUN: 25 mg/dL — AB (ref 6–23)
CHLORIDE: 97 meq/L (ref 96–112)
CO2: 29 mEq/L (ref 19–32)
Calcium: 10.7 mg/dL — ABNORMAL HIGH (ref 8.4–10.5)
Creatinine, Ser: 0.76 mg/dL (ref 0.40–1.20)
GFR: 80.69 mL/min (ref >60.00)
Glucose, Bld: 97 mg/dL (ref 70–99)
POTASSIUM: 3.8 meq/L (ref 3.5–5.1)
Sodium: 136 mEq/L (ref 135–145)

## 2015-03-19 LAB — CBC WITH DIFFERENTIAL/PLATELET
BASOS ABS: 0 10*3/uL (ref 0.0–0.1)
Basophils Relative: 0.6 % (ref 0.0–3.0)
EOS ABS: 0.1 10*3/uL (ref 0.0–0.7)
Eosinophils Relative: 0.9 % (ref 0.0–5.0)
HEMATOCRIT: 40.1 % (ref 36.0–46.0)
Hemoglobin: 13.6 g/dL (ref 12.0–15.0)
Lymphocytes Relative: 37.3 % (ref 12.0–46.0)
Lymphs Abs: 2.5 10*3/uL (ref 0.7–4.0)
MCHC: 33.8 g/dL (ref 30.0–36.0)
MCV: 81.1 fl (ref 78.0–100.0)
Monocytes Absolute: 0.4 10*3/uL (ref 0.1–1.0)
Monocytes Relative: 5.4 % (ref 3.0–12.0)
NEUTROS ABS: 3.8 10*3/uL (ref 1.4–7.7)
Neutrophils Relative %: 55.8 % (ref 43.0–77.0)
PLATELETS: 267 10*3/uL (ref 150.0–400.0)
RBC: 4.95 Mil/uL (ref 3.87–5.11)
RDW: 13.7 % (ref 11.5–15.5)
WBC: 6.8 10*3/uL (ref 4.0–10.5)

## 2015-03-19 LAB — HEPATIC FUNCTION PANEL
ALBUMIN: 4.9 g/dL (ref 3.5–5.2)
ALT: 27 U/L (ref 0–35)
AST: 25 U/L (ref 0–37)
Alkaline Phosphatase: 119 U/L — ABNORMAL HIGH (ref 39–117)
BILIRUBIN TOTAL: 0.4 mg/dL (ref 0.2–1.2)
Bilirubin, Direct: 0 mg/dL (ref 0.0–0.3)
Total Protein: 8.2 g/dL (ref 6.0–8.3)

## 2015-03-19 MED ORDER — NA SULFATE-K SULFATE-MG SULF 17.5-3.13-1.6 GM/177ML PO SOLN: 1.0000 | Freq: Once | ORAL | Status: DC

## 2015-03-19 NOTE — Patient Instructions (Signed)

## 2015-03-19 NOTE — Progress Notes (Signed)
Diana [name]    OG:[id number]    January 08, 1951  Primary Care Physician:TODD,JEFFREY Diana Resides, MD  Referring Physician: Dorena Cookey, MD [place], [place] [id number]  Chief complaint:  Blood per rectum HPI: 65 year old white female with gastroesophageal reflux disease and history of reflux esophagitis on endoscopy in October 2004. She has presented with refractory gastroesophageal reflux and underwent upper endoscopy in July 2016 which was essentially normal. Her antireflux regimen was modified to Prilosec 40 mg in the morning and ranitidine 300 mg at bedtime, which has resulted in marked improvement. She has modified her diet to decrease the size of the evening meals and is following strict antireflux measures. She denies dysphagia, odynophagia, nocturnal cough or hoarseness. There is a history of perforated diverticulitis in 2009, she underwent sigmoid resection. Last colonoscopy in 2009. She continues to have intermittent blood per rectum and also c/o occasional fecal incontinence worse in the past few months.   Outpatient Encounter Prescriptions as of 03/19/2015  Medication Sig  . amphetamine-dextroamphetamine (ADDERALL XR) 10 MG 24 hr capsule TK 1-2 CS PO QD  . ibuprofen (ADVIL,MOTRIN) 200 MG tablet Take 400 mg by mouth every 6 (six) hours as needed.  Marland Kitchen omeprazole (PRILOSEC) 40 MG capsule TAKE 1 CAPSULE (40 MG TOTAL) BY MOUTH DAILY.  Marland Kitchen triamterene-hydrochlorothiazide (DYAZIDE) 37.5-25 MG capsule TAKE ONE CAPSULE BY MOUTH DAILY  . [DISCONTINUED] HYDROcodone-acetaminophen (NORCO/VICODIN) 5-325 MG tablet Take 1 tablet by mouth every 4 (four) hours as needed.  . [DISCONTINUED] triamterene-hydrochlorothiazide (MAXZIDE-25) 37.5-25 MG per tablet TAKE 1 TABLET BY MOUTH ONCE DAILY   No facility-administered encounter medications on file as of 03/19/2015.    Allergies as of 03/19/2015 - Review Complete 03/19/2015  Allergen Reaction Noted  . Ezetimibe  04/06/2007  .  Latex Hives 09/09/2014  . Morphine sulfate  04/06/2007    Past Medical History  Diagnosis Date  . Diverticulosis   . GERD (gastroesophageal reflux disease)   . DJD (degenerative joint disease)   . ADD (attention deficit disorder)   . Mood disorder ([place])   . Hypertension   . History of hiatal hernia   . Cancer [facility])     skin    Past Surgical History  Procedure Laterality Date  . Total abdominal hysterectomy    . Hemorrhoid surgery    . Rhinoplasty      x 2  . Pilonidal cyst excision    . Placement of breast implants    . Breast implant removal    . Shoulder surgery Right     06  . Partial colectomy    . Colon surgery    . Cesarean section    . Lipoma excision N/A 10/23/2014    Procedure: EXCISION OF BACK LIPOMA;  Surgeon: Donnie Mesa, MD;  Location: [facility] OR;  Service: General;  Laterality: N/A;    Family History  Problem Relation Age of Onset  . Melanoma Mother     Social History   Social History  . Marital Status: Married    Spouse Name: N/A  . Number of Children: 1  . Years of Education: N/A   Occupational History  . Not on file.   Social History Main Topics  . Smoking status: Former Smoker -- 1.00 packs/day for 20 years    Types: Cigarettes    Quit date: 06/29/2006  . Smokeless tobacco: Never Used  . Alcohol Use: No     Comment: occ beer  .  Drug Use: No  . Sexual Activity: Not on file   Other Topics Concern  . Not on file   Social History Narrative      Review of systems: Review of Systems  Constitutional: Negative for fever and chills.  HENT: Negative.   Eyes: Negative for blurred vision.  Respiratory: Negative for cough, shortness of breath and wheezing.   Cardiovascular: Negative for chest pain and palpitations.  Gastrointestinal: as per HPI Genitourinary: Negative for dysuria, urgency, frequency and hematuria.  Musculoskeletal: Negative for myalgias, back pain and joint pain.  Skin: Negative for itching and rash.  Neurological:  Negative for dizziness, tremors, focal weakness, seizures and loss of consciousness.  Endo/Heme/Allergies: Negative for environmental allergies.  Psychiatric/Behavioral: Negative for depression, suicidal ideas and hallucinations.  All other systems reviewed and are negative.   Physical Exam: Filed Vitals:   03/19/15 1331  BP: 128/70  Pulse: 68   Gen:      No acute distress HEENT:  EOMI, sclera anicteric Neck:     No masses; no thyromegaly Lungs:    Clear to auscultation bilaterally; normal respiratory effort CV:         Regular rate and rhythm; no murmurs Abd:      + bowel sounds; soft, non-tender; no palpable masses, no distension Ext:    No edema; adequate peripheral perfusion Skin:      Warm and dry; no rash Neuro: alert and oriented x 3 Psych: normal mood and affect  Data Reviewed:  Ultrasound Abdomen 08/2014 Gallbladder: No gallstones or wall thickening visualized. There is no pericholecystic fluid. No sonographic Murphy sign noted.  Common bile duct: Diameter: 6 mm. There is no intrahepatic, common hepatic, or common bile duct dilatation.  Liver: No focal lesion identified. Liver echogenicity is overall increased.  IVC: No abnormality visualized.  Pancreas: Visualized portion unremarkable. Portions of pancreas are obscured by gas.  Spleen: Size and appearance within normal limits.  Right Kidney: Length: 9.8 cm. Echogenicity within normal limits. No mass or hydronephrosis visualized.  Left Kidney: Length: 10.4 cm. Echogenicity within normal limits. No mass or hydronephrosis visualized.  Abdominal aorta: No aneurysm visualized.  Other findings: No demonstrable ascites  Colonoscopy 2009 Normal except for sigmoid narrowing with diverticulosis  Assessment and Plan/Recommendations: 82 yr F with h/o GERD, diverticulosis here for evaluation with c/o intermittent blood per rectum Will schedule for colonoscopy for evaluation Will consider anorectal  manometry and pelvic floor PT for fecal incontinence, will re discuss after colonoscopy Return after the procedure  K. [name] , MD [phone] Mon-Fri 8a-5p [phone] after 5p, weekends, holidays

## 2015-03-25 ENCOUNTER — Telehealth: Payer: Self-pay | Admitting: Gastroenterology

## 2015-03-26 NOTE — Telephone Encounter (Signed)
Patient seen on 03/19/15. Labs are in and mostly WNL's. Please comment.

## 2015-03-26 NOTE — Telephone Encounter (Signed)
Yes labs WNL, please inform patient. Thanks

## 2015-03-26 NOTE — Telephone Encounter (Signed)
Patient is advised.  

## 2015-03-27 DIAGNOSIS — F900 Attention-deficit hyperactivity disorder, predominantly inattentive type: Secondary | ICD-10-CM | POA: Diagnosis not present

## 2015-04-01 ENCOUNTER — Ambulatory Visit (AMBULATORY_SURGERY_CENTER): Payer: PPO | Admitting: Gastroenterology

## 2015-04-01 ENCOUNTER — Encounter: Payer: Self-pay | Admitting: Gastroenterology

## 2015-04-01 VITALS — BP 121/80 | HR 58 | Temp 98.6°F | Resp 11 | Ht 59.0 in | Wt 139.0 lb

## 2015-04-01 DIAGNOSIS — K625 Hemorrhage of anus and rectum: Secondary | ICD-10-CM | POA: Diagnosis not present

## 2015-04-01 DIAGNOSIS — R159 Full incontinence of feces: Secondary | ICD-10-CM | POA: Diagnosis not present

## 2015-04-01 DIAGNOSIS — Z1211 Encounter for screening for malignant neoplasm of colon: Secondary | ICD-10-CM | POA: Diagnosis not present

## 2015-04-01 MED ORDER — SODIUM CHLORIDE 0.9 % IV SOLN: 500.0000 mL | INTRAVENOUS | Status: DC

## 2015-04-01 NOTE — Progress Notes (Signed)
A/ox3 pleased with MAC, report to Sheila RN 

## 2015-04-01 NOTE — Op Note (Signed)
[place] Patient Name: Diana [name] Procedure Date: 04/01/2015 4:16 PM MRN: [id number] Endoscopist: Mauri Pole , MD Age: 65 Referring MD:  Date of Birth: 20-Jan-1950 Gender: Female Procedure:                Colonoscopy Indications:              Rectal bleeding Medicines:                Propofol total dose 200 mg IV, Monitored Anesthesia                            Care Procedure:                Pre-Anesthesia Assessment:                           - Prior to the procedure, a History and Physical                            was performed, and patient medications and                            allergies were reviewed. The patient's tolerance of                            previous anesthesia was also reviewed. The risks                            and benefits of the procedure and the sedation                            options and risks were discussed with the patient.                            All questions were answered, and informed consent                            was obtained. Prior Anticoagulants: The patient has                            taken no previous anticoagulant or antiplatelet                            agents. ASA Grade Assessment: II - A patient with                            mild systemic disease. After reviewing the risks                            and benefits, the patient was deemed in                            satisfactory condition to undergo the procedure.  After obtaining informed consent, the colonoscope                            was passed under direct vision. Throughout the                            procedure, the patient's blood pressure, pulse, and                            oxygen saturations were monitored continuously. The                            Model CF-HQ190L [id number]) scope was introduced                            through the anus and advanced to the the cecum,   identified by appendiceal orifice and ileocecal                            valve. The colonoscopy was performed without                            difficulty. The patient tolerated the procedure                            well. The quality of the bowel preparation was                            good. The ileocecal valve, appendiceal orifice, and                            rectum were photographed. Scope In: 4:20:30 PM Scope Out: 4:32:46 PM Scope Withdrawal Time: 0 hours 9 minutes 23 seconds  Total Procedure Duration: 0 hours 12 minutes 16 seconds  Findings:      The perianal and digital rectal examinations were normal.      Non-bleeding internal hemorrhoids were found during retroflexion. The       hemorrhoids were small.      The entire examined colon appeared normal. Complications:            No immediate complications. Estimated Blood Loss:     Estimated blood loss: none. Estimated blood loss:                            none. Impression:               - Non-bleeding internal hemorrhoids.                           - The entire examined colon is normal.                           - No specimens collected. Recommendation:           - Patient has a contact number available for  emergencies. The signs and symptoms of potential                            delayed complications were discussed with the                            patient. Return to normal activities tomorrow.                            Written discharge instructions were provided to the                            patient.                           - Continue present medications.                           - Patient has a contact number available for                            emergencies. The signs and symptoms of potential                            delayed complications were discussed with the                            patient. Return to normal activities tomorrow.                             Written discharge instructions were provided to the                            patient.                           - Resume previous diet.                           - Continue present medications.                           - Repeat colonoscopy in 10 years for screening                            purposes. Procedure Code(s):        --- Professional ---                           [phone], Colonoscopy, flexible; diagnostic, including                            collection of specimen(s) by brushing or washing,                            when performed (separate procedure) CPT copyright 2016 [employer]. All rights  reserved. Mauri Pole, MD 04/01/2015 4:38:03 PM This report has been signed electronically. Number of Addenda: 0

## 2015-04-01 NOTE — Patient Instructions (Signed)
YOU HAD AN ENDOSCOPIC PROCEDURE TODAY AT THE [facility]:   Refer to the procedure report that was given to you for any specific questions about what was found during the examination.  If the procedure report does not answer your questions, please call your gastroenterologist to clarify.  If you requested that your care partner not be given the details of your procedure findings, then the procedure report has been included in a sealed envelope for you to review at your convenience later.  YOU SHOULD EXPECT: Some feelings of bloating in the abdomen. Passage of more gas than usual.  Walking can help get rid of the air that was put into your GI tract during the procedure and reduce the bloating. If you had a lower endoscopy (such as a colonoscopy or flexible sigmoidoscopy) you may notice spotting of blood in your stool or on the toilet paper. If you underwent a bowel prep for your procedure, you may not have a normal bowel movement for a few days.  Please Note:  You might notice some irritation and congestion in your nose or some drainage.  This is from the oxygen used during your procedure.  There is no need for concern and it should clear up in a day or so.  SYMPTOMS TO REPORT IMMEDIATELY:   Following lower endoscopy (colonoscopy or flexible sigmoidoscopy):  Excessive amounts of blood in the stool  Significant tenderness or worsening of abdominal pains  Swelling of the abdomen that is new, acute  Fever of 100F or higher    For urgent or emergent issues, a gastroenterologist can be reached at any hour by calling [phone].   DIET: Your first meal following the procedure should be a small meal and then it is ok to progress to your normal diet. Heavy or fried foods are harder to digest and may make you feel nauseous or bloated.  Likewise, meals heavy in dairy and vegetables can increase bloating.  Drink plenty of fluids but you should avoid alcoholic beverages for 24  hours.  ACTIVITY:  You should plan to take it easy for the rest of today and you should NOT DRIVE or use heavy machinery until tomorrow (because of the sedation medicines used during the test).    FOLLOW UP: Our staff will call the number listed on your records the next business day following your procedure to check on you and address any questions or concerns that you may have regarding the information given to you following your procedure. If we do not reach you, we will leave a message.  However, if you are feeling well and you are not experiencing any problems, there is no need to return our call.  We will assume that you have returned to your regular daily activities without incident.  If any biopsies were taken you will be contacted by phone or by letter within the next 1-3 weeks.  Please call us at [phone] if you have not heard about the biopsies in 3 weeks.    SIGNATURES/CONFIDENTIALITY: You and/or your care partner have signed paperwork which will be entered into your electronic medical record.  These signatures attest to the fact that that the information above on your After Visit Summary has been reviewed and is understood.  Full responsibility of the confidentiality of this discharge information lies with you and/or your care-partner.   Resume medications. Information given on hemorrhoids and high fiber diet. 

## 2015-04-02 ENCOUNTER — Telehealth: Payer: Self-pay

## 2015-04-02 NOTE — Telephone Encounter (Signed)
  Follow up Call-  Call back number 04/01/2015 07/24/2014  Post procedure Call Back phone  # [phone]  Permission to leave phone message Yes Yes     Patient questions:  Do you have a fever, pain , or abdominal swelling? No. Pain Score  0 *  Have you tolerated food without any problems? Yes.    Have you been able to return to your normal activities? Yes.    Do you have any questions about your discharge instructions: Diet   No. Medications  No. Follow up visit  No.  Do you have questions or concerns about your Care? No.  Actions: * If pain score is 4 or above: No action needed, pain <4.

## 2015-04-28 ENCOUNTER — Other Ambulatory Visit (INDEPENDENT_AMBULATORY_CARE_PROVIDER_SITE_OTHER): Payer: PPO

## 2015-04-28 DIAGNOSIS — R7989 Other specified abnormal findings of blood chemistry: Secondary | ICD-10-CM

## 2015-04-28 DIAGNOSIS — Z0000 Encounter for general adult medical examination without abnormal findings: Secondary | ICD-10-CM | POA: Diagnosis not present

## 2015-04-28 LAB — LIPID PANEL
CHOLESTEROL: 281 mg/dL — AB (ref 0–200)
HDL: 52.4 mg/dL (ref >39.00)
NONHDL: 228.14
Total CHOL/HDL Ratio: 5
Triglycerides: 309 mg/dL — ABNORMAL HIGH (ref 0.0–149.0)
VLDL: 61.8 mg/dL — ABNORMAL HIGH (ref 0.0–40.0)

## 2015-04-28 LAB — CBC WITH DIFFERENTIAL/PLATELET
BASOS ABS: 0 10*3/uL (ref 0.0–0.1)
BASOS PCT: 0.7 % (ref 0.0–3.0)
Eosinophils Absolute: 0.1 10*3/uL (ref 0.0–0.7)
Eosinophils Relative: 2.1 % (ref 0.0–5.0)
HEMATOCRIT: 42 % (ref 36.0–46.0)
Hemoglobin: 14 g/dL (ref 12.0–15.0)
LYMPHS PCT: 41.6 % (ref 12.0–46.0)
Lymphs Abs: 2.2 10*3/uL (ref 0.7–4.0)
MCHC: 33.5 g/dL (ref 30.0–36.0)
MCV: 82.7 fl (ref 78.0–100.0)
MONOS PCT: 6.8 % (ref 3.0–12.0)
Monocytes Absolute: 0.4 10*3/uL (ref 0.1–1.0)
NEUTROS ABS: 2.6 10*3/uL (ref 1.4–7.7)
Neutrophils Relative %: 48.8 % (ref 43.0–77.0)
PLATELETS: 253 10*3/uL (ref 150.0–400.0)
RBC: 5.07 Mil/uL (ref 3.87–5.11)
RDW: 15.5 % (ref 11.5–15.5)
WBC: 5.3 10*3/uL (ref 4.0–10.5)

## 2015-04-28 LAB — HEPATIC FUNCTION PANEL
ALBUMIN: 4.6 g/dL (ref 3.5–5.2)
ALK PHOS: 130 U/L — AB (ref 39–117)
ALT: 24 U/L (ref 0–35)
AST: 22 U/L (ref 0–37)
Bilirubin, Direct: 0 mg/dL (ref 0.0–0.3)
TOTAL PROTEIN: 7.5 g/dL (ref 6.0–8.3)
Total Bilirubin: 0.3 mg/dL (ref 0.2–1.2)

## 2015-04-28 LAB — POC URINALSYSI DIPSTICK (AUTOMATED)
BILIRUBIN UA: NEGATIVE
GLUCOSE UA: NEGATIVE
KETONES UA: NEGATIVE
Leukocytes, UA: NEGATIVE
Nitrite, UA: NEGATIVE
Protein, UA: NEGATIVE
RBC UA: NEGATIVE
SPEC GRAV UA: 1.015
UROBILINOGEN UA: 0.2
pH, UA: 6.5

## 2015-04-28 LAB — TSH: TSH: 5.2 u[IU]/mL — ABNORMAL HIGH (ref 0.35–4.50)

## 2015-04-28 LAB — BASIC METABOLIC PANEL
BUN: 20 mg/dL (ref 6–23)
CALCIUM: 10.1 mg/dL (ref 8.4–10.5)
CHLORIDE: 100 meq/L (ref 96–112)
CO2: 29 meq/L (ref 19–32)
Creatinine, Ser: 0.78 mg/dL (ref 0.40–1.20)
GFR: 78.28 mL/min (ref >60.00)
Glucose, Bld: 96 mg/dL (ref 70–99)
Potassium: 3.9 mEq/L (ref 3.5–5.1)
SODIUM: 140 meq/L (ref 135–145)

## 2015-04-28 LAB — LDL CHOLESTEROL, DIRECT: LDL DIRECT: 180 mg/dL

## 2015-04-30 ENCOUNTER — Encounter: Payer: Self-pay | Admitting: Gastroenterology

## 2015-05-05 ENCOUNTER — Encounter: Payer: Self-pay | Admitting: Family Medicine

## 2015-05-05 ENCOUNTER — Ambulatory Visit (INDEPENDENT_AMBULATORY_CARE_PROVIDER_SITE_OTHER): Payer: PPO | Admitting: Family Medicine

## 2015-05-05 VITALS — BP 120/88 | Temp 98.5°F | Ht 59.0 in | Wt 135.0 lb

## 2015-05-05 DIAGNOSIS — F909 Attention-deficit hyperactivity disorder, unspecified type: Secondary | ICD-10-CM | POA: Diagnosis not present

## 2015-05-05 DIAGNOSIS — I10 Essential (primary) hypertension: Secondary | ICD-10-CM | POA: Diagnosis not present

## 2015-05-05 DIAGNOSIS — F988 Other specified behavioral and emotional disorders with onset usually occurring in childhood and adolescence: Secondary | ICD-10-CM

## 2015-05-05 DIAGNOSIS — N952 Postmenopausal atrophic vaginitis: Secondary | ICD-10-CM | POA: Diagnosis not present

## 2015-05-05 DIAGNOSIS — Z23 Encounter for immunization: Secondary | ICD-10-CM | POA: Diagnosis not present

## 2015-05-05 DIAGNOSIS — R7989 Other specified abnormal findings of blood chemistry: Secondary | ICD-10-CM

## 2015-05-05 DIAGNOSIS — Z0000 Encounter for general adult medical examination without abnormal findings: Secondary | ICD-10-CM

## 2015-05-05 LAB — T3, FREE: T3 FREE: 3.8 pg/mL (ref 2.3–4.2)

## 2015-05-05 LAB — T4, FREE: FREE T4: 0.79 ng/dL (ref 0.60–1.60)

## 2015-05-05 LAB — TSH: TSH: 6.15 u[IU]/mL — AB (ref 0.35–4.50)

## 2015-05-05 MED ORDER — TRIAMTERENE-HCTZ 37.5-25 MG PO CAPS: 1.0000 | ORAL_CAPSULE | Freq: Every day | ORAL | Status: DC

## 2015-05-05 NOTE — Progress Notes (Signed)
Subjective:    Patient ID: Diana [name], female    DOB: [birth year], 65 y.o.   MRN: [id number]  HPI Diana [name] is a 65 year old married female nonsmoker who comes in today for general physical examination because history of mild hypertension controlled with Dyazide 30 7. 5-25 daily. BP 120/88  She also takes Adderall 10 mg XR daily because of a history of adult ADD  [facility] upper endoscopies by Dr. Maurene Capes last fall. She was told she had reflux and was given Prilosec and Zantac which she will he takes when necessary now. Her symptoms are minimal. She says her endoscopy was normal. She also had a cardiogram in October which was normal.  She gets routine eye care, dental care, BSE monthly, mammogram 2 years ago. Advised annual mammography.  Right nipple has been inverted now for couple years no changes no discharge etc.  She had her uterus removed at age 32 for bleeding ovaries were left intact. She's asymptomatic therefore pelvic exam not indicated  Pneumovax 13 and tetanus booster given today  Cognitive function normal she walks daily home health safety reviewed no issues identified, no guns in the house, she does have a healthcare power of attorney and living well. She's also had a shingles vaccine  She would see orthopedist this morning because of shoulder pain. She seen Dr. supple. He did 2 injections.   Review of Systems  Constitutional: Negative.   HENT: Negative.   Eyes: Negative.   Respiratory: Negative.   Cardiovascular: Negative.   Gastrointestinal: Negative.   Endocrine: Negative.   Genitourinary: Negative.   Musculoskeletal: Negative.   Skin: Negative.   Allergic/Immunologic: Negative.   Neurological: Negative.   Hematological: Negative.   Psychiatric/Behavioral: Negative.        Objective:   Physical Exam  Constitutional: She appears well-developed and well-nourished.  HENT:  Head: Normocephalic and atraumatic.  Right Ear: External ear normal.  Left Ear:  External ear normal.  Nose: Nose normal.  Mouth/Throat: Oropharynx is clear and moist.  Eyes: EOM are normal. Pupils are equal, round, and reactive to light.  Neck: Normal range of motion. Neck supple. No JVD present. No tracheal deviation present. No thyromegaly present.  Cardiovascular: Normal rate, regular rhythm, normal heart sounds and intact distal pulses.  Exam reveals no gallop and no friction rub.   No murmur heard. Pulmonary/Chest: Effort normal and breath sounds normal. No stridor. No respiratory distress. She has no wheezes. She has no rales. She exhibits no tenderness.  Abdominal: Soft. Bowel sounds are normal. She exhibits no distension and no mass. There is no tenderness. There is no rebound and no guarding.  Genitourinary:  Bilateral breast exam normal except for inverted nipple in the right which is chronic not an acute finding. No palpable masses no discharge  Musculoskeletal: Normal range of motion.  Lymphadenopathy:    She has no cervical adenopathy.  Neurological: She is alert. She has normal reflexes. No cranial nerve deficit. She exhibits normal muscle tone. Coordination normal.  Skin: Skin is warm and dry. No rash noted. No erythema. No pallor.  Total body skin exam normal except for scar in the T12 area to the right inch and half long. She'll lipoma removed from her back last fall  Psychiatric: She has a normal mood and affect. Her behavior is normal. Judgment and thought content normal.  Nursing note and vitals reviewed.         Assessment & Plan:  Healthy female  Adult ADD,,,,,,,,, continue long-acting Adderall  10 mg 1 daily  Mild hypertension,,,,,,,,,,, continue Maxide

## 2015-05-05 NOTE — Patient Instructions (Signed)
Continue good diet exercise program  Follow-up in one year sooner if any problems  Call today to get set up for your mammogram

## 2015-05-07 ENCOUNTER — Other Ambulatory Visit: Payer: Self-pay

## 2015-05-07 DIAGNOSIS — Z1231 Encounter for screening mammogram for malignant neoplasm of breast: Secondary | ICD-10-CM

## 2015-05-08 ENCOUNTER — Ambulatory Visit
Admission: RE | Admit: 2015-05-08 | Discharge: 2015-05-08 | Disposition: A | Discharge status: Home / Self Care | Payer: PPO | Source: Ambulatory Visit

## 2015-05-08 DIAGNOSIS — Z1231 Encounter for screening mammogram for malignant neoplasm of breast: Secondary | ICD-10-CM

## 2015-05-19 ENCOUNTER — Other Ambulatory Visit: Payer: Self-pay | Admitting: Family Medicine

## 2015-06-27 DIAGNOSIS — M65341 Trigger finger, right ring finger: Secondary | ICD-10-CM | POA: Diagnosis not present

## 2015-07-25 DIAGNOSIS — M65341 Trigger finger, right ring finger: Secondary | ICD-10-CM | POA: Diagnosis not present

## 2015-10-13 ENCOUNTER — Ambulatory Visit (INDEPENDENT_AMBULATORY_CARE_PROVIDER_SITE_OTHER)
Admission: RE | Admit: 2015-10-13 | Discharge: 2015-10-13 | Disposition: A | Discharge status: Home / Self Care | Payer: PPO | Source: Ambulatory Visit | Attending: Family Medicine | Admitting: Family Medicine

## 2015-10-13 ENCOUNTER — Ambulatory Visit (INDEPENDENT_AMBULATORY_CARE_PROVIDER_SITE_OTHER): Payer: PPO | Admitting: Family Medicine

## 2015-10-13 ENCOUNTER — Encounter: Payer: Self-pay | Admitting: Family Medicine

## 2015-10-13 VITALS — BP 138/86 | HR 82 | Temp 98.5°F | Wt 139.3 lb

## 2015-10-13 DIAGNOSIS — M5090 Cervical disc disorder, unspecified, unspecified cervical region: Secondary | ICD-10-CM

## 2015-10-13 DIAGNOSIS — Z23 Encounter for immunization: Secondary | ICD-10-CM

## 2015-10-13 DIAGNOSIS — M542 Cervicalgia: Secondary | ICD-10-CM | POA: Diagnosis not present

## 2015-10-13 MED ORDER — PREDNISONE 20 MG PO TABS: ORAL_TABLET | ORAL | 1 refills | Status: DC

## 2015-10-13 MED ORDER — TRAMADOL HCL 50 MG PO TABS: 50.0000 mg | ORAL_TABLET | Freq: Two times a day (BID) | ORAL | 0 refills | Status: DC

## 2015-10-13 NOTE — Patient Instructions (Signed)
Go to the main office now for x-rays of your neck  We will set you up with physical therapy to relieve your pain ASAP  Tramadol 50 mg........Marland Kitchen 1/2-1 tablet twice daily for severe pain  Motrin 600 mg twice daily with food Once or physical therapy sessions have been completed you should be pain-free, if not call and we will get you set up to see a neurosurgeon. It may require an epidural steroid injection like you had previously in your lumbar spine

## 2015-10-13 NOTE — Progress Notes (Signed)
Pre visit review using our clinic review tool, if applicable. No additional management support is needed unless otherwise documented below in the visit note. 

## 2015-10-13 NOTE — Progress Notes (Signed)
Diana [name] is a 65 year old married female nonsmoker comes in today for evaluation of neck pain  She states about 4 weeks ago she developed the gradual onset of neck pain and over the past couple weeks is gotten worse. She describes the pain as a dull bilateral pain in the midportion of her neck around the C4-C5 area. He said it hurts worse in the the right than the left. She says it's constant a 7 on a scale of 1-10 he keeps her awake at night and wakes her up at night. It seems to radiate to her ears. The pain is somewhat decreased by 40 mg of Motrin daily it's increased by movement. Does not hurt if she coughs or sneezes.  No history of neck trauma  Review of systems pertinent she had lumbar disc in the past the required epidural steroid injections to resolve the pain.  Vision hearing all normal. No numbness or weakness or tingling in her arms or hands.  Physical examination vital signs stable she's afebrile  In the sitting position the sensation muscle strength reflexes all within normal limits. She has some tenderness C4 C5 midline to palpation.  Impression  #1 cervical disc disease without neuropathy or neurologic deficit........... x-ray neck..... Begin physical therapy........Marland Kitchen prednisone burst and taper..... Follow-up after physical therapy. Refer to neurosurgical consult if symptoms do not abate with conservative therapy

## 2015-10-15 ENCOUNTER — Telehealth: Payer: Self-pay | Admitting: *Deleted

## 2015-10-15 NOTE — Telephone Encounter (Signed)
-----   Message from Dorena Cookey, MD sent at 10/13/2015  2:41 PM EDT ----- Diana [name] please call......... her cervical spine films show some fairly significant degenerative changes C4-C5 which is consistent with a symptom she's having. Begin the program that we outlined if she doesn't see any improvement after the completion of physical therapy or she develops any neurologic symptoms numbness weakness etc. she is to call and we'll refer her to neurosurgery

## 2015-10-15 NOTE — Telephone Encounter (Signed)
Called and spoke with pt informing her of her results. Pt states she starts physical therapy tomorrow. Pt verbalized understanding and will call after the completion of therapy.

## 2015-10-16 ENCOUNTER — Ambulatory Visit: Payer: PPO | Attending: Family Medicine | Admitting: Physical Therapy

## 2015-10-16 DIAGNOSIS — M542 Cervicalgia: Secondary | ICD-10-CM | POA: Diagnosis not present

## 2015-10-16 DIAGNOSIS — M6281 Muscle weakness (generalized): Secondary | ICD-10-CM | POA: Insufficient documentation
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
## 2015-10-16 NOTE — Patient Instructions (Signed)
Extensors, Supine   Lie supine, head on small, rolled towel. Gently tuck chin and bring toward chest. Hold __3_ seconds. Repeat __8_ times per session. Do __2-3_ sessions per day.  Copyright  VHI. All rights reserved.  Flexibility: Neck Retraction   Pull head straight back, keeping eyes and jaw level. Repeat _8-10___ times per set. Do __1__ sets per session. Do _3___ sessions per day.  http://orth.exer.us/344   Copyright  VHI. All rights reserved.    Ruben Im PT [facility] [address] [place], [place] [address] Phone # [phone] Fax [phone]

## 2015-10-20 ENCOUNTER — Ambulatory Visit: Payer: PPO | Attending: Family Medicine

## 2015-10-20 DIAGNOSIS — M542 Cervicalgia: Secondary | ICD-10-CM | POA: Diagnosis not present

## 2015-10-20 DIAGNOSIS — M6281 Muscle weakness (generalized): Secondary | ICD-10-CM | POA: Diagnosis not present

## 2015-10-20 NOTE — Patient Instructions (Addendum)
PERFORM ALL EXERCISES GENTLY AND WITH GOOD POSTURE.    20 SECOND HOLD, 3 REPS TO EACH SIDE. 4-5 TIMES EACH DAY.   AROM: Neck Rotation   Turn head slowly to look over one shoulder, then the other.   AROM: Neck Flexion   Bend head forward.   AROM: Lateral Neck Flexion   Slowly tilt head toward one shoulder, then the other.   Trigger Point Dry Needling  . What is Trigger Point Dry Needling (DN)? o DN is a physical therapy technique used to treat muscle pain and dysfunction. Specifically, DN helps deactivate muscle trigger points (muscle knots).  o A thin filiform needle is used to penetrate the skin and stimulate the underlying trigger point. The goal is for a local twitch response (LTR) to occur and for the trigger point to relax. No medication of any kind is injected during the procedure.   . What Does Trigger Point Dry Needling Feel Like?  o The procedure feels different for each individual patient. Some patients report that they do not actually feel the needle enter the skin and overall the process is not painful. Very mild bleeding may occur. However, many patients feel a deep cramping in the muscle in which the needle was inserted. This is the local twitch response.   Marland Kitchen How Will I feel after the treatment? o Soreness is normal, and the onset of soreness may not occur for a few hours. Typically this soreness does not last longer than two days.  o Bruising is uncommon, however; ice can be used to decrease any possible bruising.  o In rare cases feeling tired or nauseous after the treatment is normal. In addition, your symptoms may get worse before they get better, this period will typically not last longer than 24 hours.   . What Can I do After My Treatment? o Increase your hydration by drinking more water for the next 24 hours. o You may place ice or heat on the areas treated that have become sore, however, do not use heat on inflamed or bruised areas. Heat often brings more  relief post needling. o You can continue your regular activities, but vigorous activity is not recommended initially after the treatment for 24 hours. o DN is best combined with other physical therapy such as strengthening, stretching, and other therapies.    [facility] [address] [place], [place] [address] Phone # [phone] Fax [phone]

## 2015-10-20 NOTE — Therapy (Signed)
[facility] [address], [place], [place], [address] Phone: [phone]   Fax:  [phone]  Physical Therapy Treatment  Patient Details  Name: Diana [name] MRN: [id number] Date of Birth: 11-09-1950 Referring Provider: Dr. Sherren Mocha  Encounter Date: 10/20/2015      PT End of Session - 10/20/15 1011    Visit Number 2   Number of Visits 10   Date for PT Re-Evaluation 12/11/15   Authorization Type Medicare;  G codes;  KX at visit 15   PT Start Time 0934   PT Stop Time 1025  dry needling   PT Time Calculation (min) 51 min   Activity Tolerance Patient tolerated treatment well   Behavior During Therapy [facility] for tasks assessed/performed      Past Medical History:  Diagnosis Date  . ADD (attention deficit disorder)   . Anxiety   . Cancer ([place])    skin  . Depression   . Diverticulosis   . DJD (degenerative joint disease)   . GERD (gastroesophageal reflux disease)   . History of hiatal hernia   . Hypertension   . Mood disorder [facility])     Past Surgical History:  Procedure Laterality Date  . BREAST IMPLANT REMOVAL    . CESAREAN SECTION    . COLON SURGERY    . HEMORRHOID SURGERY    . LIPOMA EXCISION N/A 10/23/2014   Procedure: EXCISION OF BACK LIPOMA;  Surgeon: Donnie Mesa, MD;  Location: [facility];  Service: General;  Laterality: N/A;  . PARTIAL COLECTOMY    . PILONIDAL CYST EXCISION    . PLACEMENT OF BREAST IMPLANTS    . RHINOPLASTY     x 2  . SHOULDER SURGERY Right    06  . TOTAL ABDOMINAL HYSTERECTOMY      There were no vitals filed for this visit.      Subjective Assessment - 10/20/15 0937    Subjective Doing OK with new exercises.  No change yet.     Currently in Pain? Yes   Pain Location Neck   Pain Orientation Right;Left   Pain Descriptors / Indicators Dull;Sharp;Nagging   Pain Type Acute pain   Pain Onset More than a month ago   Pain Frequency Constant   Aggravating Factors  tilting head,  turning head, driving   Pain Relieving Factors support to neck, lying down                         [facility] Adult PT Treatment/Exercise - 10/20/15 0001      Exercises   Exercises Neck     Neck Exercises: Seated   Other Seated Exercise neck AROM in 3 directions 3x20 seconds     Modalities   Modalities Moist Heat     Moist Heat Therapy   Number Minutes Moist Heat 15 Minutes   Moist Heat Location Cervical     Manual Therapy   Manual Therapy Soft tissue mobilization;Myofascial release   Manual therapy comments soft tissue elongation and trigger point release to bil neck, upper trap and suboccpitals with release     Neck Exercises: Stretches   Upper Trapezius Stretch 3 reps;20 seconds          Trigger Point Dry Needling - 10/20/15 0939    Consent Given? Yes   Education Handout Provided Yes   Muscles Treated Upper Body Upper trapezius;Oblique capitus;Suboccipitals muscle group   Upper Trapezius Response Twitch reponse elicited;Palpable increased muscle  length   Oblique Capitus Response Twitch response elicited;Palpable increased muscle length   SubOccipitals Response Twitch response elicited;Palpable increased muscle length              PT Education - 10/20/15 0940    Education provided Yes   Education Details neck stretches, DN info   Person(s) Educated Patient   Methods Explanation;Demonstration;Handout   Comprehension Verbalized understanding;Returned demonstration          PT Short Term Goals - 10/20/15 0938      PT SHORT TERM GOAL #1   Title The patient will demonstrate postural awareness and compliance with initial HEP to promote healing and pain relief   11/13/15   Time 4   Period Weeks   Status On-going     PT SHORT TERM GOAL #2   Title The patient will report a 30% improvement in pain with usual ADLs   Time 4   Period Weeks   Status On-going           PT Long Term Goals - 10/16/15 1712      PT LONG TERM GOAL #1   Title  The patient will be independent with safe self progression of HEP for further improvements in ROM and strength   12/11/15   Time 8   Period Weeks   Status New     PT LONG TERM GOAL #2   Title The patient will report a 60% improvement in neck and headache pain  with ADLs   Time 8   Period Weeks   Status New     PT LONG TERM GOAL #3   Title Patient will have improved right sidebending to 34 degrees and left rotation to 40 degrees for greater ease with driving   Time 8   Period Weeks   Status New     PT LONG TERM GOAL #4   Title Deep cervical flexor and extensor strength improved to 4-/5 needed for household chores, light to medium lifting   Time 8   Period Weeks   Status New     PT LONG TERM GOAL #5   Title FOTO functional outcome score improved fro 54% limitation to 42% indicating improved function with less pain   Time 8   Period Weeks   Status New               Plan - 10/20/15 1005    Clinical Impression Statement Pt with 1st session after evalution today.  Pt with significant tension and trigger points in bil. subocciptals, upper trap and cervcal paraspinals.  Pt with trigger points and improved mobility with manul therapy after dry needling today.  Pt with reduced cervical AROM and PT issued cervical AROM exercises today.  Pt will continue to benefit from skilled PT for flexibility, manual to neck and suboccipitals, and postural strength and education to reduce pain and tension.     Rehab Potential Good   Clinical Impairments Affecting Rehab Potential left biceps rupture (non repaired);  right shoulder surgery; bilateral CTS   PT Frequency 2x / week   PT Duration 8 weeks   PT Treatment/Interventions ADLs/Self Care Home Management;Cryotherapy;Electrical Stimulation;Moist Heat;Traction;Ultrasound;Therapeutic exercise;Patient/family education;Manual techniques;Dry needling;Taping   PT Next Visit Plan assess response to dry needling, review cervical retractions and  cervical AROM from HEP;  e-stim/heat; manual to neck, cervical mechanical traction   Consulted and Agree with Plan of Care Patient      Patient will benefit from skilled therapeutic intervention in order to  improve the following deficits and impairments:  Pain, Postural dysfunction, Decreased mobility, Hypomobility, Impaired flexibility, Improper body mechanics, Decreased activity tolerance, Decreased range of motion, Decreased endurance, Increased muscle spasms  Visit Diagnosis: Cervicalgia  Muscle weakness (generalized)     Problem List Patient Active Problem List   Diagnosis Date Noted  . Cervical neck pain with evidence of disc disease 10/13/2015  . Postmenopausal atrophic vaginitis 05/05/2015  . Abnormal TSH 05/05/2015  . Bilateral hand pain 02/12/2014  . Hair loss 02/12/2014  . Cyst of nipple 01/05/2012  . Fatigue 06/29/2011  . PURE HYPERCHOLESTEROLEMIA 12/26/2008  . Attention deficit disorder 10/03/2008  . Essential hypertension 07/14/2006  . DIVERTICULOSIS, COLON 07/14/2006     Sigurd Sos, PT 10/20/15 10:12 AM  [facility] [address] [place], [place], [address] Phone: [phone]   Fax:  [phone]  Name: HATLEY DELAO MRN: [id number] Date of Birth: Oct 20, 1950

## 2015-10-24 ENCOUNTER — Encounter: Payer: Self-pay | Admitting: Physical Therapy

## 2015-10-24 ENCOUNTER — Ambulatory Visit: Payer: PPO | Admitting: Physical Therapy

## 2015-10-24 DIAGNOSIS — M542 Cervicalgia: Secondary | ICD-10-CM

## 2015-10-24 DIAGNOSIS — M6281 Muscle weakness (generalized): Secondary | ICD-10-CM

## 2015-10-24 NOTE — Therapy (Signed)
[facility] [address], [place], [place], [address] Phone: [phone]   Fax:  [phone]  Physical Therapy Treatment  Patient Details  Name: Diana [name] MRN: [id number] Date of Birth: [birth year] Referring Provider: Dr. Sherren Mocha  Encounter Date: 10/24/2015      PT End of Session - 10/24/15 1104    Visit Number 3   Number of Visits 10   Date for PT Re-Evaluation 12/11/15   Authorization Type Medicare;  G codes;  KX at visit 15   PT Start Time 1059   PT Stop Time 1200   PT Time Calculation (min) 61 min   Activity Tolerance Patient tolerated treatment well   Behavior During Therapy [facility] for tasks assessed/performed      Past Medical History:  Diagnosis Date  . ADD (attention deficit disorder)   . Anxiety   . Cancer ([place])    skin  . Depression   . Diverticulosis   . DJD (degenerative joint disease)   . GERD (gastroesophageal reflux disease)   . History of hiatal hernia   . Hypertension   . Mood disorder [facility])     Past Surgical History:  Procedure Laterality Date  . BREAST IMPLANT REMOVAL    . CESAREAN SECTION    . COLON SURGERY    . HEMORRHOID SURGERY    . LIPOMA EXCISION N/A 10/23/2014   Procedure: EXCISION OF BACK LIPOMA;  Surgeon: Donnie Mesa, MD;  Location: [place];  Service: General;  Laterality: N/A;  . PARTIAL COLECTOMY    . PILONIDAL CYST EXCISION    . PLACEMENT OF BREAST IMPLANTS    . RHINOPLASTY     x 2  . SHOULDER SURGERY Right    06  . TOTAL ABDOMINAL HYSTERECTOMY      There were no vitals filed for this visit.      Subjective Assessment - 10/24/15 1102    Subjective Did great with the DN. Pain is down overall. Turnign more to the right now, not as much as the left.    Currently in Pain? Yes   Pain Score 2    Pain Location Neck   Pain Orientation Right;Left   Pain Descriptors / Indicators Dull   Aggravating Factors  Movement   Pain Relieving Factors dry needling was very  helpful   Multiple Pain Sites No                         [facility] Adult PT Treatment/Exercise - 10/24/15 0001      Neck Exercises: Seated   Other Seated Exercise Bil cervical rotation 10x    Other Seated Exercise Collarbone lengthener 6x   Add to HEP:      Moist Heat Therapy   Number Minutes Moist Heat 15 Minutes   Moist Heat Location Cervical  Post traction; pt was a little sore     Traction   Type of Traction Cervical   Min (lbs) 5   Max (lbs) 15   Hold Time 60   Rest Time 20   Time 15 min     Manual Therapy   Manual Therapy Soft tissue mobilization;Myofascial release   Manual therapy comments soft tissue elongation and trigger point release to bil neck, upper trap and suboccpitals with release                  PT Short Term Goals - 10/20/15 [id number]      PT SHORT  TERM GOAL #1   Title The patient will demonstrate postural awareness and compliance with initial HEP to promote healing and pain relief   11/13/15   Time 4   Period Weeks   Status On-going     PT SHORT TERM GOAL #2   Title The patient will report a 30% improvement in pain with usual ADLs   Time 4   Period Weeks   Status On-going           PT Long Term Goals - 10/16/15 1712      PT LONG TERM GOAL #1   Title The patient will be independent with safe self progression of HEP for further improvements in ROM and strength   12/11/15   Time 8   Period Weeks   Status New     PT LONG TERM GOAL #2   Title The patient will report a 60% improvement in neck and headache pain  with ADLs   Time 8   Period Weeks   Status New     PT LONG TERM GOAL #3   Title Patient will have improved right sidebending to 34 degrees and left rotation to 40 degrees for greater ease with driving   Time 8   Period Weeks   Status New     PT LONG TERM GOAL #4   Title Deep cervical flexor and extensor strength improved to 4-/5 needed for household chores, light to medium lifting   Time 8   Period Weeks    Status New     PT LONG TERM GOAL #5   Title FOTO functional outcome score improved fro 54% limitation to 42% indicating improved function with less pain   Time 8   Period Weeks   Status New               Plan - 10/24/15 1130    Clinical Impression Statement Pt felt good relief from her pain after first dry needling session. She continues to feel limited in her motion; turning less to the LT than the RT.  She remains with soreness along the right side with deep trigger points noted during manual therapy today.    Rehab Potential Good   Clinical Impairments Affecting Rehab Potential left biceps rupture (non repaired);  right shoulder surgery; bilateral CTS   PT Frequency 2x / week   PT Duration 8 weeks   PT Treatment/Interventions ADLs/Self Care Home Management;Cryotherapy;Electrical Stimulation;Moist Heat;Traction;Ultrasound;Therapeutic exercise;Patient/family education;Manual techniques;Dry needling;Taping   PT Next Visit Plan Dry needling #2 next, manual techniques, assess traction   Consulted and Agree with Plan of Care Patient      Patient will benefit from skilled therapeutic intervention in order to improve the following deficits and impairments:  Pain, Postural dysfunction, Decreased mobility, Hypomobility, Impaired flexibility, Improper body mechanics, Decreased activity tolerance, Decreased range of motion, Decreased endurance, Increased muscle spasms  Visit Diagnosis: Cervicalgia  Muscle weakness (generalized)     Problem List Patient Active Problem List   Diagnosis Date Noted  . Cervical neck pain with evidence of disc disease 10/13/2015  . Postmenopausal atrophic vaginitis 05/05/2015  . Abnormal TSH 05/05/2015  . Bilateral hand pain 02/12/2014  . Hair loss 02/12/2014  . Cyst of nipple 01/05/2012  . Fatigue 06/29/2011  . PURE HYPERCHOLESTEROLEMIA 12/26/2008  . Attention deficit disorder 10/03/2008  . Essential hypertension 07/14/2006  . DIVERTICULOSIS,  COLON 07/14/2006    Jaziah Kwasnik, PTA 10/24/2015, 11:49 AM  [facility] [address], [address]  [place], [place], [address] Phone: [phone]   Fax:  [phone]  Name: Diana [name] MRN: [id number] Date of Birth: Jun 17, 1950

## 2015-10-28 ENCOUNTER — Encounter: Payer: Self-pay | Admitting: Physical Therapy

## 2015-10-28 ENCOUNTER — Ambulatory Visit: Payer: PPO | Admitting: Physical Therapy

## 2015-10-28 DIAGNOSIS — M542 Cervicalgia: Secondary | ICD-10-CM | POA: Diagnosis not present

## 2015-10-28 DIAGNOSIS — M6281 Muscle weakness (generalized): Secondary | ICD-10-CM
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
## 2015-11-04 ENCOUNTER — Ambulatory Visit: Payer: PPO

## 2015-11-04 DIAGNOSIS — M542 Cervicalgia: Secondary | ICD-10-CM

## 2015-11-04 DIAGNOSIS — M6281 Muscle weakness (generalized): Secondary | ICD-10-CM
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
## 2015-11-07 ENCOUNTER — Encounter: Payer: PPO | Admitting: Physical Therapy

## 2015-11-11 ENCOUNTER — Telehealth: Payer: Self-pay | Admitting: Family Medicine

## 2015-11-11 ENCOUNTER — Ambulatory Visit: Payer: PPO

## 2015-11-11 DIAGNOSIS — M542 Cervicalgia: Secondary | ICD-10-CM

## 2015-11-11 DIAGNOSIS — M6281 Muscle weakness (generalized): Secondary | ICD-10-CM
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
## 2015-11-11 NOTE — Telephone Encounter (Signed)
Pt was at physical therapy, needle injections, and they recommended that she be prescribed a muscle relaxer of some sort, such as Flexiril. Pt uses the [facility] on [address]. Please advise.

## 2015-11-13 ENCOUNTER — Ambulatory Visit: Payer: PPO | Admitting: Physical Therapy

## 2015-11-13 DIAGNOSIS — M542 Cervicalgia: Secondary | ICD-10-CM | POA: Diagnosis not present

## 2015-11-13 DIAGNOSIS — M6281 Muscle weakness (generalized): Secondary | ICD-10-CM

## 2015-11-13 NOTE — Therapy (Signed)
[facility] [address] [place], [place], [address] Phone: [phone]   Fax:  [phone]  Physical Therapy Treatment  Patient Details  Name: Diana [name] MRN: [id number] Date of Birth: 29-Nov-1950 Referring Provider: Dr. Sherren Mocha  Encounter Date: 11/13/2015      PT End of Session - 11/13/15 1113    Visit Number 7   Number of Visits 10   Date for PT Re-Evaluation 12/11/15   Authorization Type Medicare;  G codes;  KX at visit 15   PT Start Time 1100   PT Stop Time 1145   PT Time Calculation (min) 45 min   Activity Tolerance Patient tolerated treatment well      Past Medical History:  Diagnosis Date  . ADD (attention deficit disorder)   . Anxiety   . Cancer ([place])    skin  . Depression   . Diverticulosis   . DJD (degenerative joint disease)   . GERD (gastroesophageal reflux disease)   . History of hiatal hernia   . Hypertension   . Mood disorder [facility])     Past Surgical History:  Procedure Laterality Date  . BREAST IMPLANT REMOVAL    . CESAREAN SECTION    . COLON SURGERY    . HEMORRHOID SURGERY    . LIPOMA EXCISION N/A 10/23/2014   Procedure: EXCISION OF BACK LIPOMA;  Surgeon: Donnie Mesa, MD;  Location: [place];  Service: General;  Laterality: N/A;  . PARTIAL COLECTOMY    . PILONIDAL CYST EXCISION    . PLACEMENT OF BREAST IMPLANTS    . RHINOPLASTY     x 2  . SHOULDER SURGERY Right    06  . TOTAL ABDOMINAL HYSTERECTOMY      There were no vitals filed for this visit.      Subjective Assessment - 11/13/15 1103    Subjective Really stiff and sore this AM in neck possibly from needling on Tuesday.  I haven't taken any pain medicine.  I like the foam roll, the store was out of them so I'm looking other places.     Currently in Pain? Yes   Pain Score 7    Pain Location Neck            [facility] PT Assessment - 11/13/15 0001      AROM   Cervical Flexion 55   Cervical Extension 40   Cervical - Right Side Bend 25   Cervical - Left Side Bend 35   Cervical - Right Rotation 48   Cervical - Left Rotation 43                     [facility] Adult PT Treatment/Exercise - 11/13/15 0001      Neck Exercises: Seated   Lateral Flexion --  30 sec hold 2x right and left   Other Seated Exercise Bil cervical rotation 10x   on foam roll   Other Seated Exercise supine on foam roll for decompression x 5 minutes     Neck Exercises: Supine   Shoulder ABduction 20 reps  horizontal abduction on foam roller   Upper Extremity D2 10 reps;Extension;Theraband  on foam roller   Theraband Level (UE D2) Level 1 (Yellow)   Other Supine Exercise external rotation and horizontal abduction yellow theraband 2x10  foam roller     Neck Exercises: Prone   Axial Exentsion 5 reps   Other Prone Exercise head lift with shoulder horizontal abduction, shoulder extension 5x  each     Moist Heat Therapy   Number Minutes Moist Heat 15 Minutes   Moist Heat Location Cervical     Electrical Stimulation   Electrical Stimulation Location cervical    Electrical Stimulation Action IFC   Electrical Stimulation Parameters 7 ma 15 min supine   Electrical Stimulation Goals Pain                PT Education - 11/13/15 1221    Education provided Yes   Education Details upper trap stretch; prone head lift/arm lift   Person(s) Educated Patient   Methods Explanation;Demonstration;Handout   Comprehension Verbalized understanding;Returned demonstration          PT Short Term Goals - 11/13/15 1114      PT SHORT TERM GOAL #1   Title The patient will demonstrate postural awareness and compliance with initial HEP to promote healing and pain relief   11/13/15   Status Achieved     PT SHORT TERM GOAL #2   Title The patient will report a 30% improvement in pain with usual ADLs   Status Achieved     PT SHORT TERM GOAL #3   Title Cervical right sidebending improved to 30 degrees and left  rotation to 35 degrees needed for driving   Time 4   Period Weeks   Status Partially Met           PT Long Term Goals - 11/13/15 1114      PT LONG TERM GOAL #1   Title The patient will be independent with safe self progression of HEP for further improvements in ROM and strength   12/11/15   Time 8   Period Weeks   Status On-going     PT LONG TERM GOAL #2   Title The patient will report a 60% improvement in neck and headache pain  with ADLs   Period Weeks   Status On-going     PT LONG TERM GOAL #3   Title Patient will have improved right sidebending to 34 degrees and left rotation to 40 degrees for greater ease with driving   Time 8   Period Weeks   Status On-going     PT LONG TERM GOAL #4   Title Deep cervical flexor and extensor strength improved to 4-/5 needed for household chores, light to medium lifting   Time 8   Period Weeks   Status On-going     PT LONG TERM GOAL #5   Title FOTO functional outcome score improved fro 54% limitation to 42% indicating improved function with less pain   Time 8   Period Weeks   Status On-going               Plan - 11/13/15 1127    Clinical Impression Statement Good improvements in cervical flexion, extension and bilateral rotation AROM.  Sidebending ROM still limited.  Patient is receptive to postural strengthening without pain exacerbation although soreness and feeling of stiffness persists.  Following e-stim and moist heat, she reports decreased discomfort and improved soft tissue length following.  Therapist closely monitoring response to all interventions.     PT Next Visit Plan dry needling as needed; e-stim/heat as needed;  manual therapy;  postural strengthening;  ROM especially sidebending      Patient will benefit from skilled therapeutic intervention in order to improve the following deficits and impairments:     Visit Diagnosis: Muscle weakness (generalized)  Cervicalgia     Problem List Patient Active  Problem List   Diagnosis Date Noted  . Cervical neck pain with evidence of disc disease 10/13/2015  . Postmenopausal atrophic vaginitis 05/05/2015  . Abnormal TSH 05/05/2015  . Bilateral hand pain 02/12/2014  . Hair loss 02/12/2014  . Cyst of nipple 01/05/2012  . Fatigue 06/29/2011  . PURE HYPERCHOLESTEROLEMIA 12/26/2008  . Attention deficit disorder 10/03/2008  . Essential hypertension 07/14/2006  . DIVERTICULOSIS, COLON 07/14/2006   Ruben Im, PT 11/13/15 1:53 PM Phone: [phone] Fax: [phone]  Alvera Singh 11/13/2015, 1:52 PM  [facility] [address], [place], [place], [address] Phone: [phone]   Fax:  [phone]  Name: Diana [name] MRN: [id number] Date of Birth: Feb 05, 1950

## 2015-11-13 NOTE — Patient Instructions (Signed)
  Ruben Im PT [facility] [address], [place], [place] [address] Phone # [phone] Fax [phone]  [email or web address]   Copyright  VHI. All rights reserved.  Side-Bending   One hand on opposite side of head, pull head to side as far as is comfortable. Stop if there is pain. Hold __30__ seconds. Repeat with other hand to other side. Repeat __3__ times. Do __1-2__ sessions per day.

## 2015-11-18 ENCOUNTER — Ambulatory Visit: Payer: PPO

## 2015-11-18 DIAGNOSIS — M542 Cervicalgia: Secondary | ICD-10-CM

## 2015-11-18 DIAGNOSIS — M6281 Muscle weakness (generalized): Secondary | ICD-10-CM

## 2015-11-18 NOTE — Therapy (Signed)
[facility] [address] [place], [place], [address] Phone: [phone]   Fax:  [phone]  Physical Therapy Treatment  Patient Details  Name: Diana [name] MRN: [id number] Date of Birth: 1950/07/05 Referring Provider: Dr. Sherren Mocha  Encounter Date: 11/18/2015      PT End of Session - 11/18/15 1056    Visit Number 8   Number of Visits 10   Date for PT Re-Evaluation 12/11/15   Authorization Type Medicare;  G codes;  KX at visit 15   PT Start Time 1019   PT Stop Time 1112   PT Time Calculation (min) 53 min   Activity Tolerance Patient tolerated treatment well   Behavior During Therapy [facility] for tasks assessed/performed      Past Medical History:  Diagnosis Date  . ADD (attention deficit disorder)   . Anxiety   . Cancer ([place])    skin  . Depression   . Diverticulosis   . DJD (degenerative joint disease)   . GERD (gastroesophageal reflux disease)   . History of hiatal hernia   . Hypertension   . Mood disorder [facility])     Past Surgical History:  Procedure Laterality Date  . BREAST IMPLANT REMOVAL    . CESAREAN SECTION    . COLON SURGERY    . HEMORRHOID SURGERY    . LIPOMA EXCISION N/A 10/23/2014   Procedure: EXCISION OF BACK LIPOMA;  Surgeon: Donnie Mesa, MD;  Location: [place];  Service: General;  Laterality: N/A;  . PARTIAL COLECTOMY    . PILONIDAL CYST EXCISION    . PLACEMENT OF BREAST IMPLANTS    . RHINOPLASTY     x 2  . SHOULDER SURGERY Right    06  . TOTAL ABDOMINAL HYSTERECTOMY      There were no vitals filed for this visit.      Subjective Assessment - 11/18/15 1022    Subjective Pt reports that she feels 90% better overall.     Diagnostic tests x-rays showed extensive degenerative changes C3-4   Currently in Pain? Yes   Pain Score 2    Pain Location Neck   Pain Orientation Left;Right   Pain Descriptors / Indicators Dull   Pain Type Acute pain   Pain Onset More than a month ago   Pain Frequency Constant   Aggravating Factors  unknown at times, stress   Pain Relieving Factors pain medication, dry needling, warm water in shower                         [facility] Adult PT Treatment/Exercise - 11/18/15 0001      Neck Exercises: Machines for Strengthening   UBE (Upper Arm Bike) Level 1 x 6 minutes (3/3)  PT present to assess goals     Neck Exercises: Seated   Other Seated Exercise Bil cervical rotation 10x   on foam roll   Other Seated Exercise supine on foam roll for decompression x 5 minutes     Neck Exercises: Supine   Shoulder ABduction 20 reps  horizontal abduction on foam roller   Upper Extremity D2 10 reps;Extension;Theraband  on foam roller   Theraband Level (UE D2) Level 2 (Red)   Other Supine Exercise external rotation and horizontal abduction red theraband 2x10  foam roller     Moist Heat Therapy   Number Minutes Moist Heat 15 Minutes   Moist Heat Location Cervical     Electrical Stimulation  Engineer, mining IFC   Electrical Stimulation Parameters 15 minutes   Electrical Stimulation Goals Pain     Manual Therapy   Manual Therapy Soft tissue mobilization;Myofascial release   Manual therapy comments soft tissue elongation and trigger point release to bil neck, upper trap and suboccpitals with release                PT Education - 11/18/15 1038    Education provided Yes   Education Details supine red theraband   Person(s) Educated Patient   Methods Explanation;Demonstration;Handout   Comprehension Verbalized understanding;Returned demonstration          PT Short Term Goals - 11/18/15 1023      PT SHORT TERM GOAL #3   Title Cervical right sidebending improved to 30 degrees and left rotation to 35 degrees needed for driving   Time 4   Period Weeks   Status On-going           PT Long Term Goals - 11/18/15 1027      PT LONG TERM GOAL #1   Title The  patient will be independent with safe self progression of HEP for further improvements in ROM and strength   12/11/15   Time 8   Period Weeks   Status On-going     PT LONG TERM GOAL #2   Title The patient will report a 60% improvement in neck and headache pain  with ADLs   Status Achieved               Plan - 11/18/15 1032    Clinical Impression Statement Pt reports 90% overall improvement in symptoms since the start of care.  Pt tolerated advancement of exercises to use of red band today.  Pt with trigger points in neck and upper trap and this is reduced since the start of care.  Pt wanted to have dry needling next session as her symptoms are so mild today.  Pt will continue to benefit from skilled PT for postural strength, flexibility, manual and modalities.     Rehab Potential Good   Clinical Impairments Affecting Rehab Potential left biceps rupture (non repaired);  right shoulder surgery; bilateral CTS   PT Frequency 2x / week   PT Duration 8 weeks   PT Treatment/Interventions ADLs/Self Care Home Management;Cryotherapy;Electrical Stimulation;Moist Heat;Traction;Ultrasound;Therapeutic exercise;Patient/family education;Manual techniques;Dry needling;Taping   PT Next Visit Plan dry needling as needed; e-stim/heat as needed;  manual therapy;  postural strengthening;  ROM especially sidebending   Consulted and Agree with Plan of Care Patient      Patient will benefit from skilled therapeutic intervention in order to improve the following deficits and impairments:  Pain, Postural dysfunction, Decreased mobility, Hypomobility, Impaired flexibility, Improper body mechanics, Decreased activity tolerance, Decreased range of motion, Decreased endurance, Increased muscle spasms  Visit Diagnosis: Muscle weakness (generalized)  Cervicalgia     Problem List Patient Active Problem List   Diagnosis Date Noted  . Cervical neck pain with evidence of disc disease 10/13/2015  .  Postmenopausal atrophic vaginitis 05/05/2015  . Abnormal TSH 05/05/2015  . Bilateral hand pain 02/12/2014  . Hair loss 02/12/2014  . Cyst of nipple 01/05/2012  . Fatigue 06/29/2011  . PURE HYPERCHOLESTEROLEMIA 12/26/2008  . Attention deficit disorder 10/03/2008  . Essential hypertension 07/14/2006  . DIVERTICULOSIS, COLON 07/14/2006     Sigurd Sos, PT 11/18/15 11:07 AM  [facility] [address] [place],  [place], [address] Phone: [phone]   Fax:  [phone]  Name: BILEN GOLE MRN: [id number] Date of Birth: 08-08-1950

## 2015-11-18 NOTE — Patient Instructions (Addendum)
Over Head Pull: Narrow Grip       On back, knees bent, feet flat, band across thighs, elbows straight but relaxed. Pull hands apart (start). Keeping elbows straight, bring arms up and over head, hands toward floor. Keep pull steady on band. Hold momentarily. Return slowly, keeping pull steady, back to start. Repeat __10_ times. Band color _red____   Side Pull: Double Arm   On back, knees bent, feet flat. Arms perpendicular to body, shoulder level, elbows straight but relaxed. Pull arms out to sides, elbows straight. Resistance band comes across collarbones, hands toward floor. Hold momentarily. Slowly return to starting position. Repeat _10__ times. Band color _red___   Sash   On back, knees bent, feet flat, left hand on left hip, right hand above left. Pull right arm DIAGONALLY (hip to shoulder) across chest. Bring right arm along head toward floor. Hold momentarily. Slowly return to starting position. Repeat __10_ times. Do with left arm. Band color ___red__   Shoulder Rotation: Double Arm   On back, knees bent, feet flat, elbows tucked at sides, bent 90, hands palms up. Pull hands apart and down toward floor, keeping elbows near sides. Hold momentarily. Slowly return to starting position. Repeat _10__ times. Band color __red___     [facility] [address], [place], [place] [address] Phone # [phone] Fax [phone]____

## 2015-11-20 ENCOUNTER — Ambulatory Visit: Payer: PPO | Attending: Family Medicine

## 2015-11-20 DIAGNOSIS — M6281 Muscle weakness (generalized): Secondary | ICD-10-CM | POA: Diagnosis not present

## 2015-11-20 DIAGNOSIS — M542 Cervicalgia: Secondary | ICD-10-CM | POA: Diagnosis not present

## 2015-11-20 NOTE — Therapy (Signed)
[facility] [address] [place], [place], [address] Phone: [phone]   Fax:  [phone]  Physical Therapy Treatment  Patient Details  Name: Diana [name] MRN: [id number] Date of Birth: 1950/10/12 Referring Provider: Dr. Sherren Mocha  Encounter Date: 11/20/2015      PT End of Session - 11/20/15 1108    Visit Number 9   Number of Visits 10   Date for PT Re-Evaluation 12/11/15   Authorization Type Medicare;  G codes;  KX at visit 15   PT Start Time 1017   PT Stop Time 1117   PT Time Calculation (min) 60 min   Activity Tolerance Patient tolerated treatment well   Behavior During Therapy [facility] for tasks assessed/performed      Past Medical History:  Diagnosis Date  . ADD (attention deficit disorder)   . Anxiety   . Cancer ([place])    skin  . Depression   . Diverticulosis   . DJD (degenerative joint disease)   . GERD (gastroesophageal reflux disease)   . History of hiatal hernia   . Hypertension   . Mood disorder [facility])     Past Surgical History:  Procedure Laterality Date  . BREAST IMPLANT REMOVAL    . CESAREAN SECTION    . COLON SURGERY    . HEMORRHOID SURGERY    . LIPOMA EXCISION N/A 10/23/2014   Procedure: EXCISION OF BACK LIPOMA;  Surgeon: Donnie Mesa, MD;  Location: [place];  Service: General;  Laterality: N/A;  . PARTIAL COLECTOMY    . PILONIDAL CYST EXCISION    . PLACEMENT OF BREAST IMPLANTS    . RHINOPLASTY     x 2  . SHOULDER SURGERY Right    06  . TOTAL ABDOMINAL HYSTERECTOMY      There were no vitals filed for this visit.      Subjective Assessment - 11/20/15 1031    Subjective Feeling better overall.  I don't think i need the needling today because I am feeling good.     Currently in Pain? Yes   Pain Score 2    Pain Location Neck   Pain Orientation Left;Right   Pain Descriptors / Indicators Dull   Pain Type Acute pain   Pain Onset More than a month ago   Pain Frequency Constant                          [facility] Adult PT Treatment/Exercise - 11/20/15 0001      Exercises   Exercises Shoulder     Neck Exercises: Machines for Strengthening   UBE (Upper Arm Bike) Level 1 x 10 min (5/5)  PT present to assess goals     Neck Exercises: Seated   Other Seated Exercise supine on foam roll for decompression x 5 minutes     Neck Exercises: Supine   Shoulder ABduction 20 reps  horizontal abduction on foam roller   Upper Extremity D2 10 reps;Extension;Theraband  on foam roller   Theraband Level (UE D2) Level 2 (Red)   Other Supine Exercise external rotation and horizontal abduction red theraband 2x10  foam roller     Shoulder Exercises: Seated   Flexion Strengthening;Both;20 reps   Flexion Weight (lbs) 1   Abduction Strengthening;Both;20 reps;Weights   ABduction Weight (lbs) 1   ABduction Limitations scaption 1# 2x10     Moist Heat Therapy   Number Minutes Moist Heat 15 Minutes   Moist Heat  Location Cervical     Electrical Stimulation   Electrical Stimulation Location cervical    Electrical Stimulation Action IFC   Electrical Stimulation Parameters 15 minutes   Electrical Stimulation Goals Pain     Manual Therapy   Manual Therapy Soft tissue mobilization;Myofascial release   Manual therapy comments soft tissue elongation and trigger point release to bil neck, upper trap and suboccpitals with release                  PT Short Term Goals - 11/18/15 1023      PT SHORT TERM GOAL #3   Title Cervical right sidebending improved to 30 degrees and left rotation to 35 degrees needed for driving   Time 4   Period Weeks   Status On-going           PT Long Term Goals - 11/18/15 1027      PT LONG TERM GOAL #1   Title The patient will be independent with safe self progression of HEP for further improvements in ROM and strength   12/11/15   Time 8   Period Weeks   Status On-going     PT LONG TERM GOAL #2   Title The patient will  report a 60% improvement in neck and headache pain  with ADLs   Status Achieved               Plan - 11/20/15 1038    Clinical Impression Statement Pt reports 90% overall improvement in symptoms since the start of care.  Pt tolerated advancement of strength exercises to red band this week.  Pt with trigger points in neck and upper trap that have significantly improved since the start of care.  Pt requires verbal and tactile cues to reduce scapular elevation.  Pt will continue to benefit from skilled PT for postural strength, flexibility, and manual/modalities.     Rehab Potential Good   PT Frequency 2x / week   PT Duration 8 weeks   PT Treatment/Interventions ADLs/Self Care Home Management;Cryotherapy;Electrical Stimulation;Moist Heat;Traction;Ultrasound;Therapeutic exercise;Patient/family education;Manual techniques;Dry needling;Taping   PT Next Visit Plan dry needling as needed; e-stim/heat as needed;  manual therapy;  postural strengthening;  ROM especially sidebending. G-codes   Consulted and Agree with Plan of Care Patient      Patient will benefit from skilled therapeutic intervention in order to improve the following deficits and impairments:  Pain, Postural dysfunction, Decreased mobility, Hypomobility, Impaired flexibility, Improper body mechanics, Decreased activity tolerance, Decreased range of motion, Decreased endurance, Increased muscle spasms  Visit Diagnosis: Muscle weakness (generalized)  Cervicalgia     Problem List Patient Active Problem List   Diagnosis Date Noted  . Cervical neck pain with evidence of disc disease 10/13/2015  . Postmenopausal atrophic vaginitis 05/05/2015  . Abnormal TSH 05/05/2015  . Bilateral hand pain 02/12/2014  . Hair loss 02/12/2014  . Cyst of nipple 01/05/2012  . Fatigue 06/29/2011  . PURE HYPERCHOLESTEROLEMIA 12/26/2008  . Attention deficit disorder 10/03/2008  . Essential hypertension 07/14/2006  . DIVERTICULOSIS, COLON  07/14/2006     Sigurd Sos, PT 11/20/15 11:15 AM   [facility] [address] [place], [place], [address] Phone: [phone]   Fax:  [phone]  Name: Diana [name] MRN: [id number] Date of Birth: 1950/04/21

## 2015-11-26 ENCOUNTER — Ambulatory Visit: Payer: Self-pay

## 2015-11-27 ENCOUNTER — Encounter: Payer: PPO | Admitting: Physical Therapy

## 2015-12-01 ENCOUNTER — Ambulatory Visit: Payer: PPO

## 2015-12-01 DIAGNOSIS — M6281 Muscle weakness (generalized): Secondary | ICD-10-CM

## 2015-12-01 DIAGNOSIS — M542 Cervicalgia: Secondary | ICD-10-CM

## 2015-12-01 NOTE — Therapy (Signed)
[facility] [address] [place], [place], [address] Phone: [phone]   Fax:  [phone]  Physical Therapy Treatment  Patient Details  Name: Diana [name] MRN: [id number] Date of Birth: [birth year] Referring Provider: Dr. Sherren Mocha  Encounter Date: 12/01/2015      PT End of Session - 12/01/15 1143    Visit Number 10   Number of Visits 20   Date for PT Re-Evaluation 12/11/15   Authorization Type Medicare;  G codes;  KX at visit 15   PT Start Time 1059   PT Stop Time 1155   PT Time Calculation (min) 56 min   Activity Tolerance Patient tolerated treatment well   Behavior During Therapy [facility] for tasks assessed/performed      Past Medical History:  Diagnosis Date  . ADD (attention deficit disorder)   . Anxiety   . Cancer ([place])    skin  . Depression   . Diverticulosis   . DJD (degenerative joint disease)   . GERD (gastroesophageal reflux disease)   . History of hiatal hernia   . Hypertension   . Mood disorder [facility])     Past Surgical History:  Procedure Laterality Date  . BREAST IMPLANT REMOVAL    . CESAREAN SECTION    . COLON SURGERY    . HEMORRHOID SURGERY    . LIPOMA EXCISION N/A 10/23/2014   Procedure: EXCISION OF BACK LIPOMA;  Surgeon: Donnie Mesa, MD;  Location: [place];  Service: General;  Laterality: N/A;  . PARTIAL COLECTOMY    . PILONIDAL CYST EXCISION    . PLACEMENT OF BREAST IMPLANTS    . RHINOPLASTY     x 2  . SHOULDER SURGERY Right    06  . TOTAL ABDOMINAL HYSTERECTOMY      There were no vitals filed for this visit.      Subjective Assessment - 12/01/15 1105    Subjective Feeling better overall.  Able to turn my head much further now.  Still sore at end range but much better.     Currently in Pain? Yes   Pain Score 1    Pain Location Neck   Pain Orientation Left;Right   Pain Descriptors / Indicators Dull   Pain Type Acute pain   Pain Onset More than a month ago   Pain  Frequency Constant   Aggravating Factors  sleep, unknown, stress   Pain Relieving Factors pain meds, warm water            [facility] PT Assessment - 12/01/15 0001      Observation/Other Assessments   Focus on Therapeutic Outcomes (FOTO)  46% limitation                     [facility] Adult PT Treatment/Exercise - 12/01/15 0001      Neck Exercises: Machines for Strengthening   UBE (Upper Arm Bike) Level 1 x 10 min (5/5)  PT present to assess goals     Neck Exercises: Seated   Other Seated Exercise supine on foam roll for decompression x 5 minutes     Neck Exercises: Supine   Shoulder ABduction 20 reps  horizontal abduction on foam roller   Upper Extremity D2 10 reps;Extension;Theraband  on foam roller   Theraband Level (UE D2) Level 3 (Green)   Other Supine Exercise external rotation and horizontal abduction green theraband 2x10  foam roller     Shoulder Exercises: Seated   Flexion Strengthening;Both;20 reps  Flexion Weight (lbs) 1   Abduction Strengthening;Both;20 reps;Weights   ABduction Weight (lbs) 1   ABduction Limitations scaption 1# 2x10     Moist Heat Therapy   Number Minutes Moist Heat 15 Minutes   Moist Heat Location Cervical     Electrical Stimulation   Electrical Stimulation Location cervical    Electrical Stimulation Action IFC   Electrical Stimulation Parameters 15 minutes   Electrical Stimulation Goals Pain     Manual Therapy   Manual Therapy Soft tissue mobilization;Myofascial release   Manual therapy comments soft tissue elongation and trigger point release to bil neck, upper trap and suboccpitals with release                  PT Short Term Goals - 11/18/15 1023      PT SHORT TERM GOAL #3   Title Cervical right sidebending improved to 30 degrees and left rotation to 35 degrees needed for driving   Time 4   Period Weeks   Status On-going           PT Long Term Goals - 12/16/15 1106      PT LONG TERM GOAL #1   Title  The patient will be independent with safe self progression of HEP for further improvements in ROM and strength   12/11/15   Time 8   Period Weeks   Status On-going               Plan - 16-Dec-2015 1107    Clinical Impression Statement Pt reports 90% overall improvement in symptoms since the strat of care.  Pt missed PT last week due to illness and didn't have a set-back.  Pt with mild trigger points in neck and upper trap that have improved since the start of c are.  Pt will attend 1 session next week to finalize HEP and final goal assessment.     Rehab Potential Good   Clinical Impairments Affecting Rehab Potential left biceps rupture (non repaired);  right shoulder surgery; bilateral CTS   PT Frequency 2x / week   PT Duration 8 weeks   PT Treatment/Interventions ADLs/Self Care Home Management;Cryotherapy;Electrical Stimulation;Moist Heat;Traction;Ultrasound;Therapeutic exercise;Patient/family education;Manual techniques;Dry needling;Taping   PT Next Visit Plan 1 more session probable.  Finalize HEP, G-codes next session (FOTO was done today)   Consulted and Agree with Plan of Care Patient      Patient will benefit from skilled therapeutic intervention in order to improve the following deficits and impairments:  Pain, Postural dysfunction, Decreased mobility, Hypomobility, Impaired flexibility, Improper body mechanics, Decreased activity tolerance, Decreased range of motion, Decreased endurance, Increased muscle spasms  Visit Diagnosis: Muscle weakness (generalized)  Cervicalgia       G-Codes - 12-16-15 1104    Functional Assessment Tool Used FOTO: 46% limitation   Functional Limitation Carrying, moving and handling objects   Carrying, Moving and Handling Objects Current Status [id number]) At least 40 percent but less than 60 percent impaired, limited or restricted   Carrying, Moving and Handling Objects Goal Status [id number]) At least 20 percent but less than 40 percent impaired,  limited or restricted      Problem List Patient Active Problem List   Diagnosis Date Noted  . Cervical neck pain with evidence of disc disease 10/13/2015  . Postmenopausal atrophic vaginitis 05/05/2015  . Abnormal TSH 05/05/2015  . Bilateral hand pain 02/12/2014  . Hair loss 02/12/2014  . Cyst of nipple 01/05/2012  . Fatigue 06/29/2011  . PURE HYPERCHOLESTEROLEMIA 12/26/2008  .  Attention deficit disorder 10/03/2008  . Essential hypertension 07/14/2006  . DIVERTICULOSIS, COLON 07/14/2006     Sigurd Sos, PT 12/01/15 11:45 AM  [facility] [address], [place], [place], [address] Phone: [phone]   Fax:  [phone]  Name: Diana [name] MRN: [id number] Date of Birth: 09/24/1950

## 2015-12-03 ENCOUNTER — Encounter: Payer: PPO | Admitting: Physical Therapy

## 2015-12-08 ENCOUNTER — Ambulatory Visit: Payer: PPO

## 2015-12-08 DIAGNOSIS — M6281 Muscle weakness (generalized): Secondary | ICD-10-CM

## 2015-12-08 DIAGNOSIS — M542 Cervicalgia: Secondary | ICD-10-CM
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
## 2016-02-12 DIAGNOSIS — M79641 Pain in right hand: Secondary | ICD-10-CM | POA: Diagnosis not present

## 2016-02-12 DIAGNOSIS — M65341 Trigger finger, right ring finger: Secondary | ICD-10-CM | POA: Diagnosis not present

## 2016-03-08 DIAGNOSIS — M65341 Trigger finger, right ring finger: Secondary | ICD-10-CM | POA: Diagnosis not present

## 2016-03-15 DIAGNOSIS — Z4789 Encounter for other orthopedic aftercare: Secondary | ICD-10-CM | POA: Diagnosis not present

## 2016-03-15 DIAGNOSIS — M65341 Trigger finger, right ring finger: Secondary | ICD-10-CM | POA: Diagnosis not present

## 2016-03-19 DIAGNOSIS — M65341 Trigger finger, right ring finger: Secondary | ICD-10-CM | POA: Diagnosis not present

## 2016-04-08 ENCOUNTER — Ambulatory Visit (INDEPENDENT_AMBULATORY_CARE_PROVIDER_SITE_OTHER): Payer: PPO | Admitting: Family Medicine

## 2016-04-08 ENCOUNTER — Encounter: Payer: Self-pay | Admitting: Family Medicine

## 2016-04-08 VITALS — BP 158/88 | HR 74 | Temp 98.4°F | Wt 140.0 lb

## 2016-04-08 DIAGNOSIS — M5090 Cervical disc disorder, unspecified, unspecified cervical region: Secondary | ICD-10-CM | POA: Diagnosis not present

## 2016-04-08 DIAGNOSIS — L659 Nonscarring hair loss, unspecified: Secondary | ICD-10-CM

## 2016-04-08 DIAGNOSIS — Z7689 Persons encountering health services in other specified circumstances: Secondary | ICD-10-CM

## 2016-04-08 DIAGNOSIS — R5383 Other fatigue: Secondary | ICD-10-CM

## 2016-04-08 LAB — CBC WITH DIFFERENTIAL/PLATELET
Basophils Absolute: 0 10*3/uL (ref 0.0–0.1)
Basophils Relative: 0.8 % (ref 0.0–3.0)
Eosinophils Absolute: 0.1 10*3/uL (ref 0.0–0.7)
Eosinophils Relative: 1.7 % (ref 0.0–5.0)
HCT: 42.5 % (ref 36.0–46.0)
Hemoglobin: 14.5 g/dL (ref 12.0–15.0)
Lymphocytes Relative: 35.1 % (ref 12.0–46.0)
Lymphs Abs: 2 10*3/uL (ref 0.7–4.0)
MCHC: 34 g/dL (ref 30.0–36.0)
MCV: 86.2 fl (ref 78.0–100.0)
Monocytes Absolute: 0.3 10*3/uL (ref 0.1–1.0)
Monocytes Relative: 5.4 % (ref 3.0–12.0)
Neutro Abs: 3.2 10*3/uL (ref 1.4–7.7)
Neutrophils Relative %: 57 % (ref 43.0–77.0)
Platelets: 245 10*3/uL (ref 150.0–400.0)
RBC: 4.93 Mil/uL (ref 3.87–5.11)
RDW: 13.9 % (ref 11.5–15.5)
WBC: 5.6 10*3/uL (ref 4.0–10.5)

## 2016-04-08 LAB — BASIC METABOLIC PANEL
BUN: 18 mg/dL (ref 6–23)
CALCIUM: 10.6 mg/dL — AB (ref 8.4–10.5)
CO2: 28 meq/L (ref 19–32)
Chloride: 101 mEq/L (ref 96–112)
Creatinine, Ser: 0.81 mg/dL (ref 0.40–1.20)
GFR: 74.73 mL/min (ref >60.00)
GLUCOSE: 102 mg/dL — AB (ref 70–99)
Potassium: 3.7 mEq/L (ref 3.5–5.1)
SODIUM: 140 meq/L (ref 135–145)

## 2016-04-08 LAB — TSH: TSH: 4.47 u[IU]/mL (ref 0.35–4.50)

## 2016-04-08 LAB — T4, FREE: Free T4: 0.75 ng/dL (ref 0.60–1.60)

## 2016-04-08 LAB — T3, FREE: T3 FREE: 4.1 pg/mL (ref 2.3–4.2)

## 2016-04-08 NOTE — Patient Instructions (Addendum)
It was a pleasure to meet you today!  We have ordered labs or studies at this visit. It can take up to 1-2 weeks for results and processing. IF results require follow up or explanation, we will call you with instructions. Clinically stable results will be released to your [facility]. If you have not heard from [place] or cannot find your results in [facility] in 2 weeks please contact our office at [phone].  If you are not yet signed up for [facility], please consider signing up   Also, please schedule a physical at your convenience.  Fatigue Fatigue is feeling tired all of the time, a lack of energy, or a lack of motivation. Occasional or mild fatigue is often a normal response to activity or life in general. However, long-lasting (chronic) or extreme fatigue may indicate an underlying medical condition. Follow these instructions at home: Watch your fatigue for any changes. The following actions may help to lessen any discomfort you are feeling:  Talk to your health care provider about how much sleep you need each night. Try to get the required amount every night.  Take medicines only as directed by your health care provider.  Eat a healthy and nutritious diet. Ask your health care provider if you need help changing your diet.  Drink enough fluid to keep your urine clear or pale yellow.  Practice ways of relaxing, such as yoga, meditation, massage therapy, or acupuncture.  Exercise regularly.  Change situations that cause you stress. Try to keep your work and personal routine reasonable.  Do not abuse illegal drugs.  Limit alcohol intake to no more than 1 drink per day for nonpregnant women and 2 drinks per day for men. One drink equals 12 ounces of beer, 5 ounces of wine, or 1 ounces of hard liquor.  Take a multivitamin, if directed by your health care provider. Contact a health care provider if:  Your fatigue does not get better.  You have a fever.  You have unintentional weight loss  or gain.  You have headaches.  You have difficulty:  Falling asleep.  Sleeping throughout the night.  You feel angry, guilty, anxious, or sad.  You are unable to have a bowel movement (constipation).  You skin is dry.  Your legs or another part of your body is swollen. Get help right away if:  You feel confused.  Your vision is blurry.  You feel faint or pass out.  You have a severe headache.  You have severe abdominal, pelvic, or back pain.  You have chest pain, shortness of breath, or an irregular or fast heartbeat.  You are unable to urinate or you urinate less than normal.  You develop abnormal bleeding, such as bleeding from the rectum, vagina, nose, lungs, or nipples.  You vomit blood.  You have thoughts about harming yourself or committing suicide.  You are worried that you might harm someone else. This information is not intended to replace advice given to you by your health care provider. Make sure you discuss any questions you have with your health care provider. Document Released: 11/01/2006 Document Revised: 06/12/2015 Document Reviewed: 05/08/2013 Elsevier Interactive Patient Education  2017 [facility] FAST TRACK!!!  SAME DAY Appointments for ACUTE CARE  Such as: Sprains, Injuries, cuts, abrasions, rashes, muscle pain, joint pain, back pain Colds, flu, sore throats, headache, allergies, cough, fever  Ear pain, sinus and eye infections Abdominal pain, nausea, vomiting, diarrhea, upset stomach Animal/insect  bites  3 Easy Ways to Schedule: Walk-In Scheduling Call in scheduling Mychart Sign-up: [email or web address]

## 2016-04-08 NOTE — Progress Notes (Signed)
Patient ID: Diana [name], female   DOB: Nov 26, 1950, 66 y.o.   MRN: [id number]  Patient presents to clinic today to establish care.  Acute Concerns: Thinning hair and eyebrows have been noted over the past year.  She states that this has been noticed by her hair stylist.  She reports last TSH was elevated however T4 and T3 were normal.  Associated dry skin and fatigue are noted.  Nails are reported to have some ridges however they have been manicured today and not able to see ridges.  She reports intolerance to heat/cold have developed. Denies fever, chills, sweats, N/V/D, constipation, chest pain, palpitations, headaches, numbness, tingling,,weakness, hematochezia, melena, or unusual bleeding or bruising. No aggravating or alleviating factors noted other than marital stressors where she and her husband are seeking counseling.  Identical twin sister has a history of hypothyroidism. No treatments have been tried at this time.  Chronic Issues:  Hypercholesterolemia: History of elevated cholesterol; she states that she has been unable to take statin therapy.  She follows low fat, modified heart healthy diet.  She reports walking and is active otherwise but no formal exercise program currently.  She is due for a physical and lab work will be evaluated at that time.   Cervical neck pain: History of neck pain that was evaluated and treated. She was treated for cervicalgia by PT and states that she had some improvement but did not receive complete resolution of symptoms.  She states pain is not currently present today but she has noted it to be as high as a 6 or 7.  She denies any numbness, tingling, weakness, or radiating pain. She uses cervical neck pillow at home and stretching exercises which provides some relief.   Health Maintenance: Dental -- Every 6 months Vision -- Wears glasses; UTD; once yearly appointment Immunizations -- UTD Colonoscopy -- Completed 04/01/15; normal; internal hemorrhoids;  follow up in 10 years Mammogram --Due at this time; no history of abnormal screenings. PAP -- not needed; hysterectomy and no abnormal Paps in the past. Bone Density -- Needed; will complete this with physical   Past Medical History:  Diagnosis Date  . ADD (attention deficit disorder)   . Anxiety   . Cancer ([place])    skin  . Depression   . Diverticulosis   . DJD (degenerative joint disease)   . GERD (gastroesophageal reflux disease)   . History of hiatal hernia   . Hypertension   . Mood disorder [facility])     Past Surgical History:  Procedure Laterality Date  . BREAST IMPLANT REMOVAL    . CESAREAN SECTION    . COLON SURGERY    . HEMORRHOID SURGERY    . LIPOMA EXCISION N/A 10/23/2014   Procedure: EXCISION OF BACK LIPOMA;  Surgeon: Donnie Mesa, MD;  Location: [place];  Service: General;  Laterality: N/A;  . PARTIAL COLECTOMY    . PILONIDAL CYST EXCISION    . PLACEMENT OF BREAST IMPLANTS    . RHINOPLASTY     x 2  . SHOULDER SURGERY Right    06  . TOTAL ABDOMINAL HYSTERECTOMY      Current Outpatient Prescriptions on File Prior to Visit  Medication Sig Dispense Refill  . ibuprofen (ADVIL,MOTRIN) 200 MG tablet Take 400 mg by mouth every 6 (six) hours as needed.    . ranitidine (ZANTAC) 150 MG tablet Take 150 mg by mouth 2 (two) times daily.    Marland Kitchen triamterene-hydrochlorothiazide (DYAZIDE) 37.5-25 MG capsule Take 1 each (1  capsule total) by mouth daily. 90 capsule 4  . amphetamine-dextroamphetamine (ADDERALL XR) 10 MG 24 hr capsule TK 1-2 CS PO QD  0  . Flaxseed, Linseed, (FLAXSEED OIL MAX STR) 1300 MG CAPS Take 1 capsule by mouth daily.    Marland Kitchen omeprazole (PRILOSEC) 40 MG capsule TAKE 1 CAPSULE (40 MG TOTAL) BY MOUTH DAILY. (Patient not taking: Reported on 04/08/2016) 30 capsule 3  . traMADol (ULTRAM) 50 MG tablet Take 1 tablet (50 mg total) by mouth 2 (two) times daily. (Patient not taking: Reported on 04/08/2016) 60 tablet 0   No current facility-administered medications on file prior  to visit.     Allergies  Allergen Reactions  . Ezetimibe     REACTION: statins make joints ache  . Latex Hives  . Morphine Sulfate     REACTION: n \\T \ v    Family History  Problem Relation Age of Onset  . Melanoma Mother   . Kidney cancer Father     Social History   Social History  . Marital status: Married    Spouse name: N/A  . Number of children: 1  . Years of education: N/A   Occupational History  . Not on file.   Social History Main Topics  . Smoking status: Former Smoker    Packs/day: 1.00    Years: 20.00    Types: Cigarettes    Quit date: 06/29/2006  . Smokeless tobacco: Never Used  . Alcohol use No     Comment: occ beer  . Drug use: No  . Sexual activity: Not on file   Other Topics Concern  . Not on file   Social History Narrative  . No narrative on file    ROS  BP (!) 158/88 (BP Location: Left Arm, Patient Position: Sitting, Cuff Size: Normal)   Pulse 74   Temp 98.4 F (36.9 C) (Oral)   Wt 140 lb (63.5 kg)   SpO2 95%   BMI 28.28 kg/m   Physical Exam  Assessment/Plan: 1. Other fatigue Exam is reassuring; will obtain lab work for further evaluation; she also mentioned marital stressors for which she and her husband are seeking counseling and this may play a role in fatigue also. - CBC with Differential/Platelet - Basic metabolic panel - TSH - T4, free - T3, free  2. Hair thinning No obvious areas of alopecia are noted; will evaluate with lab. Also advised her to monitor number of hairs lost each day and check after washing hair. Advised her to count hair loss and report this for further evaluation at her physical  3. Cervical neck pain with evidence of disc disease Improved, she is working with a massage therapist and will continue to do so; advised reporting worsening symptoms or return of pain that occurred previously; will consider prednisone taper if needed and referral to sports medicine for evaluation  4. Encounter to establish  care We reviewed the PMH, PSH, FH, SH, Meds and Allergies. -We provided refills for any medications we will prescribe as needed. -We addressed current concerns per orders and patient instructions. -We have asked for records for pertinent exams, studies, vaccines and notes from previous providers. -We have advised patient to follow up per instructions below.   -Patient advised to return or notify a provider immediately if symptoms worsen or persist or new concerns arise.   Follow up for physical in the next 1 to 2 months. Sooner follow up may be indicated after review of lab work.   Delano Metz,  FNP-C

## 2016-04-08 NOTE — Progress Notes (Signed)
Pre visit review using our clinic review tool, if applicable. No additional management support is needed unless otherwise documented below in the visit note. 

## 2016-04-11 DIAGNOSIS — M79671 Pain in right foot: Secondary | ICD-10-CM | POA: Diagnosis not present

## 2016-05-20 ENCOUNTER — Other Ambulatory Visit: Payer: Self-pay | Admitting: Family Medicine

## 2016-05-20 DIAGNOSIS — Z1231 Encounter for screening mammogram for malignant neoplasm of breast: Secondary | ICD-10-CM

## 2016-06-03 ENCOUNTER — Encounter: Payer: PPO | Admitting: Family Medicine

## 2016-06-16 ENCOUNTER — Ambulatory Visit: Payer: PPO

## 2016-07-24 ENCOUNTER — Other Ambulatory Visit: Payer: Self-pay | Admitting: Family Medicine

## 2016-08-30 ENCOUNTER — Other Ambulatory Visit: Payer: Self-pay | Admitting: Family Medicine

## 2016-08-30 ENCOUNTER — Encounter: Payer: Self-pay | Admitting: Family Medicine

## 2016-08-30 ENCOUNTER — Ambulatory Visit (INDEPENDENT_AMBULATORY_CARE_PROVIDER_SITE_OTHER): Payer: PPO | Admitting: Family Medicine

## 2016-08-30 VITALS — BP 140/88 | HR 70 | Temp 97.9°F | Ht 59.0 in | Wt 140.0 lb

## 2016-08-30 DIAGNOSIS — L659 Nonscarring hair loss, unspecified: Secondary | ICD-10-CM | POA: Diagnosis not present

## 2016-08-30 DIAGNOSIS — Z13820 Encounter for screening for osteoporosis: Secondary | ICD-10-CM

## 2016-08-30 DIAGNOSIS — I10 Essential (primary) hypertension: Secondary | ICD-10-CM

## 2016-08-30 DIAGNOSIS — Z0001 Encounter for general adult medical examination with abnormal findings: Secondary | ICD-10-CM

## 2016-08-30 DIAGNOSIS — H9313 Tinnitus, bilateral: Secondary | ICD-10-CM

## 2016-08-30 DIAGNOSIS — E2839 Other primary ovarian failure: Secondary | ICD-10-CM

## 2016-08-30 DIAGNOSIS — E785 Hyperlipidemia, unspecified: Secondary | ICD-10-CM

## 2016-08-30 LAB — T3, FREE: T3, Free: 3.4 pg/mL (ref 2.3–4.2)

## 2016-08-30 LAB — BASIC METABOLIC PANEL
BUN: 17 mg/dL (ref 6–23)
CALCIUM: 9.4 mg/dL (ref 8.4–10.5)
CO2: 26 meq/L (ref 19–32)
Chloride: 104 mEq/L (ref 96–112)
Creatinine, Ser: 0.74 mg/dL (ref 0.40–1.20)
GFR: 82.85 mL/min (ref >60.00)
Glucose, Bld: 98 mg/dL (ref 70–99)
Potassium: 3.7 mEq/L (ref 3.5–5.1)
SODIUM: 139 meq/L (ref 135–145)

## 2016-08-30 LAB — TSH: TSH: 4.72 u[IU]/mL — AB (ref 0.35–4.50)

## 2016-08-30 LAB — HEPATIC FUNCTION PANEL
ALK PHOS: 110 U/L (ref 39–117)
ALT: 24 U/L (ref 0–35)
AST: 22 U/L (ref 0–37)
Albumin: 4.5 g/dL (ref 3.5–5.2)
BILIRUBIN DIRECT: 0 mg/dL (ref 0.0–0.3)
Total Bilirubin: 0.4 mg/dL (ref 0.2–1.2)
Total Protein: 6.7 g/dL (ref 6.0–8.3)

## 2016-08-30 LAB — CBC WITH DIFFERENTIAL/PLATELET
BASOS PCT: 0.6 % (ref 0.0–3.0)
Basophils Absolute: 0 10*3/uL (ref 0.0–0.1)
EOS ABS: 0.1 10*3/uL (ref 0.0–0.7)
Eosinophils Relative: 2 % (ref 0.0–5.0)
HCT: 37.9 % (ref 36.0–46.0)
Hemoglobin: 12.9 g/dL (ref 12.0–15.0)
LYMPHS PCT: 37.4 % (ref 12.0–46.0)
Lymphs Abs: 1.9 10*3/uL (ref 0.7–4.0)
MCHC: 34 g/dL (ref 30.0–36.0)
MCV: 87.5 fl (ref 78.0–100.0)
MONO ABS: 0.3 10*3/uL (ref 0.1–1.0)
MONOS PCT: 5.1 % (ref 3.0–12.0)
NEUTROS ABS: 2.8 10*3/uL (ref 1.4–7.7)
NEUTROS PCT: 54.9 % (ref 43.0–77.0)
Platelets: 234 10*3/uL (ref 150.0–400.0)
RBC: 4.33 Mil/uL (ref 3.87–5.11)
RDW: 14 % (ref 11.5–15.5)
WBC: 5 10*3/uL (ref 4.0–10.5)

## 2016-08-30 LAB — LIPID PANEL
CHOL/HDL RATIO: 6
Cholesterol: 256 mg/dL — ABNORMAL HIGH (ref 0–200)
HDL: 45.5 mg/dL (ref >39.00)
NONHDL: 210.55
Triglycerides: 338 mg/dL — ABNORMAL HIGH (ref 0.0–149.0)
VLDL: 67.6 mg/dL — AB (ref 0.0–40.0)

## 2016-08-30 LAB — LDL CHOLESTEROL, DIRECT: Direct LDL: 171 mg/dL

## 2016-08-30 LAB — T4, FREE: Free T4: 0.75 ng/dL (ref 0.60–1.60)

## 2016-08-30 NOTE — Progress Notes (Signed)
Subjective:    Patient ID: Diana [name], female    DOB: 01/17/1951, 66 y.o.   MRN: [id number]  HPI  Diana [name] is a 66 year old female who presents today for routine physical exam and follow up of her chronic conditions.  Thinning hair and eyebrows have been noted previously and this remains unchanged. She denies that problem is worse. Prior thyroid studies were normal and noted below.  Lab Results  Component Value Date   TSH 4.47 04/08/2016  T3 and T4 on 04/08/16 was WNL.  She reports intolerance to heat/cold intermittently. Denies fever, chills, sweats, N/V/D, constipation, chest pain, palpitaitons, HAs, numbness, tingling, weakness, hematochezia, melena, or unusual bleeding or brusing. No treatments have been tried at home. Identical twin sister has a history of hypothyroidism.  Tinnitus has been present for greater than one year per patient.  She denies hearing loss, vertigo, or ear fullness/pressure.  She seeks regular dental and vision care. Colonoscopy is UTD with follow up in 10 years which will be due in 2027. She is due for mammography and bone density screening at this time.  Reviewed health maintenance protocols including mammography, colonoscopy, bone density, and reviewed appropriate screening labs.  Her immunization history was reviewed as well as her current medications and allergies. Refills of her chronic medications were given and the plan for yearly health maintenance was discussed.  All orders and referrals were made as appropriate.    Review of Systems Constitutional: No fever, chills, significant weight change, fatigue, weakness or night sweats Eyes: No redness, discharge, pain, blurred vision, double vision, or loss of vision ENT/mouth: No nasal congestion, postnasal drainage,epistaxis, purulent discharge, earache, hearing loss, sore throat , dental pain, or hoarseness.  Positive for tinnitus Cardiovascular: no chest pain, palpitations, racing, irregular  rhythm, syncope, nausea, sweating, claudication, or edema  Respiratory: No cough, sputum production,hemoptysis,  dyspnea, paroxysmal nocturnal dyspnea, pleuritic chest pain, significant snoring, or  apnea    Gastrointestinal: No heartburn,dysphagia, nausea and vomiting,ominal pain, change in bowels, anorexia, diarrhea, significant constipation, rectal bleeding, melena,  stool incontinence or jaundice Genitourinary: No dysuria,hematuria, pyuria, frequency, urgency,  incontinence, nocturia, dark urine or flank pain Musculoskeletal: No myalgias or muscle cramping, joint stiffness, joint swelling, joint color change, weakness, or cyanosis Dermatologic: No rash, pruritus, urticaria, or change in color or temperature of skin.  Neurologic: No headache, vertigo, limb weakness, tremor, gait disturbance, seizures, memory loss, numbness or tingling Psychiatric: No significant anxiety or depression, anhedonia, panic attacks, insomnia, or anorexia.  Denies anxious or depressed mood today. Endocrine: No change in hair/skin/ nails, however thinning hair remains present, excessive thirst, excessive hunger, excessive urination, or unexplained fatigue Hematologic/lymphatic: No bruising, lymphadenopathy,or  abnormal clotting Allergy/immunology: No itchy/ watery eyes, abnormal sneezing, rhinitis, urticaria ,or angioedema     Past Medical History:  Diagnosis Date  . ADD (attention deficit disorder)   . Anxiety   . Cancer ([place])    skin  . Depression   . Diverticulosis   . DJD (degenerative joint disease)   . GERD (gastroesophageal reflux disease)   . History of hiatal hernia   . Hypertension   . Mood disorder [facility])      Social History   Social History  . Marital status: Married    Spouse name: N/A  . Number of children: 1  . Years of education: N/A   Occupational History  . Not on file.   Social History Main Topics  . Smoking status: Former Smoker    Packs/day:  1.00    Years: 20.00    Types:  Cigarettes    Quit date: 06/29/2006  . Smokeless tobacco: Never Used  . Alcohol use No     Comment: occ beer  . Drug use: No  . Sexual activity: Not on file   Other Topics Concern  . Not on file   Social History Narrative  . No narrative on file    Past Surgical History:  Procedure Laterality Date  . BREAST IMPLANT REMOVAL    . CESAREAN SECTION    . COLON SURGERY    . HEMORRHOID SURGERY    . LIPOMA EXCISION N/A 10/23/2014   Procedure: EXCISION OF BACK LIPOMA;  Surgeon: Donnie Mesa, MD;  Location: [facility];  Service: General;  Laterality: N/A;  . PARTIAL COLECTOMY    . PILONIDAL CYST EXCISION    . PLACEMENT OF BREAST IMPLANTS    . RHINOPLASTY     x 2  . SHOULDER SURGERY Right    06  . TOTAL ABDOMINAL HYSTERECTOMY      Family History  Problem Relation Age of Onset  . Melanoma Mother   . Kidney cancer Father     Allergies  Allergen Reactions  . Ezetimibe     REACTION: statins make joints ache  . Latex Hives  . Morphine Sulfate     REACTION: n \\T \ v    Current Outpatient Prescriptions on File Prior to Visit  Medication Sig Dispense Refill  . ibuprofen (ADVIL,MOTRIN) 200 MG tablet Take 400 mg by mouth every 6 (six) hours as needed.    Marland Kitchen omeprazole (PRILOSEC) 40 MG capsule TAKE 1 CAPSULE (40 MG TOTAL) BY MOUTH DAILY. (Patient not taking: Reported on 04/08/2016) 30 capsule 3  . ranitidine (ZANTAC) 150 MG tablet Take 150 mg by mouth 2 (two) times daily.    Marland Kitchen triamterene-hydrochlorothiazide (DYAZIDE) 37.5-25 MG capsule TAKE ONE CAPSULE BY MOUTH EVERY DAY 90 capsule 3   No current facility-administered medications on file prior to visit.     BP 140/88 (BP Location: Left Arm, Patient Position: Sitting, Cuff Size: Normal)   Pulse 70   Temp 97.9 F (36.6 C) (Oral)   Ht 4\' 11"  (1.499 m)   Wt 140 lb (63.5 kg)   SpO2 98%   BMI 28.28 kg/m    Objective:   Physical Exam  Physical Exam  Constitutional: She is oriented to person, place, and time. She appears  well-developed and well-nourished. No distress.  HENT:  Head: Normocephalic and atraumatic. No obvious areas of alopecia noted.  Right Ear: Tympanic membrane and ear canal normal.  Left Ear: Tympanic membrane and ear canal normal.  Mouth/Throat: Oropharynx is clear and moist.  Eyes: Pupils are equal, round, and reactive to light. No scleral icterus.  Neck: Normal range of motion. No thyromegaly present.  Cardiovascular: Normal rate and regular rhythm.   No murmur heard. Pulmonary/Chest: Effort normal and breath sounds normal. No respiratory distress. He has no wheezes. She has no rales. She exhibits no tenderness.  Abdominal: Soft. Bowel sounds are normal. She exhibits no distension and no mass. There is no tenderness. There is no rebound and no guarding.  Musculoskeletal: She exhibits no edema.  Lymphadenopathy:    She has no cervical adenopathy.  Neurological: She is alert and oriented to person, place, and time. She has normal patellar reflexes. She exhibits normal muscle tone. Coordination normal.  Skin: Skin is warm and dry.  Psychiatric: She has a normal mood and affect. Her behavior is  normal. Judgment and thought content normal.  Breasts: Deferred; patient stated that she will obtain a mammogram and defer breast exam today. No pap needed; prior hysterectomy and no abnormal paps in the past.     Assessment & Plan:  1. Encounter for general adult medical examination with abnormal findings 66 y.o. female presenting for annual physical.  Health Maintenance counseling: 1. Anticipatory guidance: Patient counseled regarding regular dental exams, eye exams, wearing seatbelts.  2. Risk factor reduction:  Advised patient of need for regular exercise and diet rich and fruits and vegetables to reduce risk of heart attack and stroke.  3. Immunizations/screenings/ancillary studies Immunization History  Administered Date(s) Administered  . Influenza Split 09/28/2012  . Influenza Whole  11/05/2004, 09/18/2008  . Influenza, High Dose Seasonal PF 09/02/2014, 10/13/2015  . Influenza-Unspecified 10/18/2013  . Pneumococcal Conjugate-13 05/05/2015  . Pneumococcal Polysaccharide-23 03/21/2007  . Td 01/19/2003  . Tdap 05/05/2015  . Zoster 03/27/2013   Health Maintenance Due  Topic Date Due  . Hepatitis C Screening  1950-01-28  . DEXA SCAN  04/06/2013  . PNA vac Low Risk Adult (2 of 2 - PPSV23) 05/04/2016  . INFLUENZA VACCINE  08/18/2016   4. Cervical cancer screening- Hysterectomy and no abnormal Paps in the the past; not needed 5. Breast cancer screening-  Referral for mammogram placed; deferred breast exam today per patient request 6. Colon cancer screening - UTD; completed 04/01/15; follow up in 10 years  64. Skin cancer screening- No suspicious lesions are reported today; reviewed ABCDEs of screening and advised use of daily sunscreen.  2. Hair thinning No obvious areas of alopecia are noted. Will obtain thyroid studies and can consider referral to dermatology if needed. - Lipid panel - TSH - T3, free - T4, free  3. Essential hypertension Controlled; Continue dyazide daily and advised monitoring blood pressure at least weekly and adhering to low salt diet. - CBC with Differential/Platelet - Basic metabolic panel  4. Tinnitus of both ears Neuro exam is reassuring today; will evaluate thyroid studies today. We discussed that further evaluation can be considered if lab work is normal and tinnitus remains. She is interested in being evaluated by ENT if symptoms persist after lab evaluation.  5. Hyperlipidemia, unspecified hyperlipidemia type Will check labs today; prior intolerance to statins per patient. Advised low fat, modified heart healthy diet and encouraged exercise. After review of lab work, recommendations will be considered such as another choice of statin or timing of therapy.  - Hepatic function panel - Lipid panel  6. Serum calcium elevated Mildly  elevated Ca level; will evaluate PTH for abnormalities. - PTH, Intact and Calcium  7. Screening for osteoporosis  - DG Bone Density; Future  Follow up in 6 months for chronic conditions or sooner if needed.  Delano Metz, FNP-C

## 2016-08-30 NOTE — Patient Instructions (Signed)
It was a pleasure to see you today.  We have ordered labs or studies at this visit. It can take up to 1-2 weeks for results and processing. IF results require follow up or explanation, we will call you with instructions. Clinically stable results will be released to your [facility]. If you have not heard from [place] or cannot find your results in [facility] in 2 weeks please contact our office at [phone].  If you are not yet signed up for [facility], please consider signing up   Also, please consider ShingRx vaccine that can be obtained at your pharmacy. Consider calling pharmacy ahead to verify that vaccine is available prior to visit.  Plan to complete your mammogram and also your bone density screening.  Keep up the great work with your dietary choices!  See information below for health maintenance information.  Health Maintenance, Female Adopting a healthy lifestyle and getting preventive care can go a long way to promote health and wellness. Talk with your health care provider about what schedule of regular examinations is right for you. This is a good chance for you to check in with your provider about disease prevention and staying healthy. In between checkups, there are plenty of things you can do on your own. Experts have done a lot of research about which lifestyle changes and preventive measures are most likely to keep you healthy. Ask your health care provider for more information. Weight and diet Eat a healthy diet  Be sure to include plenty of vegetables, fruits, low-fat dairy products, and lean protein.  Do not eat a lot of foods high in solid fats, added sugars, or salt.  Get regular exercise. This is one of the most important things you can do for your health. ? Most adults should exercise for at least 150 minutes each week. The exercise should increase your heart rate and make you sweat (moderate-intensity exercise). ? Most adults should also do strengthening exercises at least  twice a week. This is in addition to the moderate-intensity exercise.  Maintain a healthy weight  Body mass index (BMI) is a measurement that can be used to identify possible weight problems. It estimates body fat based on height and weight. Your health care provider can help determine your BMI and help you achieve or maintain a healthy weight.  For females 69 years of age and older: ? A BMI below 18.5 is considered underweight. ? A BMI of 18.5 to 24.9 is normal. ? A BMI of 25 to 29.9 is considered overweight. ? A BMI of 30 and above is considered obese.  Watch levels of cholesterol and blood lipids  You should start having your blood tested for lipids and cholesterol at 68 years of age, then have this test every 5 years.  You may need to have your cholesterol levels checked more often if: ? Your lipid or cholesterol levels are high. ? You are older than 68 years of age. ? You are at high risk for heart disease.  Cancer screening Lung Cancer  Lung cancer screening is recommended for adults 40-2 years old who are at high risk for lung cancer because of a history of smoking.  A yearly low-dose CT scan of the lungs is recommended for people who: ? Currently smoke. ? Have quit within the past 15 years. ? Have at least a 30-pack-year history of smoking. A pack year is smoking an average of one pack of cigarettes a day for 1 year.  Yearly screening should continue  until it has been 15 years since you quit.  Yearly screening should stop if you develop a health problem that would prevent you from having lung cancer treatment.  Breast Cancer  Practice breast self-awareness. This means understanding how your breasts normally appear and feel.  It also means doing regular breast self-exams. Let your health care provider know about any changes, no matter how small.  If you are in your 20s or 30s, you should have a clinical breast exam (CBE) by a health care provider every 1-3 years as  part of a regular health exam.  If you are 82 or older, have a CBE every year. Also consider having a breast X-ray (mammogram) every year.  If you have a family history of breast cancer, talk to your health care provider about genetic screening.  If you are at high risk for breast cancer, talk to your health care provider about having an MRI and a mammogram every year.  Breast cancer gene (BRCA) assessment is recommended for women who have family members with BRCA-related cancers. BRCA-related cancers include: ? Breast. ? Ovarian. ? Tubal. ? Peritoneal cancers.  Results of the assessment will determine the need for genetic counseling and BRCA1 and BRCA2 testing.  Cervical Cancer Your health care provider may recommend that you be screened regularly for cancer of the pelvic organs (ovaries, uterus, and vagina). This screening involves a pelvic examination, including checking for microscopic changes to the surface of your cervix (Pap test). You may be encouraged to have this screening done every 3 years, beginning at age 32.  For women ages 37-65, health care providers may recommend pelvic exams and Pap testing every 3 years, or they may recommend the Pap and pelvic exam, combined with testing for human papilloma virus (HPV), every 5 years. Some types of HPV increase your risk of cervical cancer. Testing for HPV may also be done on women of any age with unclear Pap test results.  Other health care providers may not recommend any screening for nonpregnant women who are considered low risk for pelvic cancer and who do not have symptoms. Ask your health care provider if a screening pelvic exam is right for you.  If you have had past treatment for cervical cancer or a condition that could lead to cancer, you need Pap tests and screening for cancer for at least 20 years after your treatment. If Pap tests have been discontinued, your risk factors (such as having a new sexual partner) need to be  reassessed to determine if screening should resume. Some women have medical problems that increase the chance of getting cervical cancer. In these cases, your health care provider may recommend more frequent screening and Pap tests.  Colorectal Cancer  This type of cancer can be detected and often prevented.  Routine colorectal cancer screening usually begins at 68 years of age and continues through 68 years of age.  Your health care provider may recommend screening at an earlier age if you have risk factors for colon cancer.  Your health care provider may also recommend using home test kits to check for hidden blood in the stool.  A small camera at the end of a tube can be used to examine your colon directly (sigmoidoscopy or colonoscopy). This is done to check for the earliest forms of colorectal cancer.  Routine screening usually begins at age 19.  Direct examination of the colon should be repeated every 5-10 years through 68 years of age. However, you may need  to be screened more often if early forms of precancerous polyps or small growths are found.  Skin Cancer  Check your skin from head to toe regularly.  Tell your health care provider about any new moles or changes in moles, especially if there is a change in a mole's shape or color.  Also tell your health care provider if you have a mole that is larger than the size of a pencil eraser.  Always use sunscreen. Apply sunscreen liberally and repeatedly throughout the day.  Protect yourself by wearing long sleeves, pants, a wide-brimmed hat, and sunglasses whenever you are outside.  Heart disease, diabetes, and high blood pressure  High blood pressure causes heart disease and increases the risk of stroke. High blood pressure is more likely to develop in: ? People who have blood pressure in the high end of the normal range (130-139/85-89 mm Hg). ? People who are overweight or obese. ? People who are African American.  If you  are 31-66 years of age, have your blood pressure checked every 3-5 years. If you are 56 years of age or older, have your blood pressure checked every year. You should have your blood pressure measured twice-once when you are at a hospital or clinic, and once when you are not at a hospital or clinic. Record the average of the two measurements. To check your blood pressure when you are not at a hospital or clinic, you can use: ? An automated blood pressure machine at a pharmacy. ? A home blood pressure monitor.  If you are between 51 years and 23 years old, ask your health care provider if you should take aspirin to prevent strokes.  Have regular diabetes screenings. This involves taking a blood sample to check your fasting blood sugar level. ? If you are at a normal weight and have a low risk for diabetes, have this test once every three years after 68 years of age. ? If you are overweight and have a high risk for diabetes, consider being tested at a younger age or more often. Preventing infection Hepatitis B  If you have a higher risk for hepatitis B, you should be screened for this virus. You are considered at high risk for hepatitis B if: ? You were born in a country where hepatitis B is common. Ask your health care provider which countries are considered high risk. ? Your parents were born in a high-risk country, and you have not been immunized against hepatitis B (hepatitis B vaccine). ? You have HIV or AIDS. ? You use needles to inject street drugs. ? You live with someone who has hepatitis B. ? You have had sex with someone who has hepatitis B. ? You get hemodialysis treatment. ? You take certain medicines for conditions, including cancer, organ transplantation, and autoimmune conditions.  Hepatitis C  Blood testing is recommended for: ? Everyone born from [birth year] through 1967. ? Anyone with known risk factors for hepatitis C.  Sexually transmitted infections (STIs)  You should be  screened for sexually transmitted infections (STIs) including gonorrhea and chlamydia if: ? You are sexually active and are younger than 68 years of age. ? You are older than 68 years of age and your health care provider tells you that you are at risk for this type of infection. ? Your sexual activity has changed since you were last screened and you are at an increased risk for chlamydia or gonorrhea. Ask your health care provider if you are at risk.  If you do not have HIV, but are at risk, it may be recommended that you take a prescription medicine daily to prevent HIV infection. This is called pre-exposure prophylaxis (PrEP). You are considered at risk if: ? You are sexually active and do not regularly use condoms or know the HIV status of your partner(s). ? You take drugs by injection. ? You are sexually active with a partner who has HIV.  Talk with your health care provider about whether you are at high risk of being infected with HIV. If you choose to begin PrEP, you should first be tested for HIV. You should then be tested every 3 months for as long as you are taking PrEP. Pregnancy  If you are premenopausal and you may become pregnant, ask your health care provider about preconception counseling.  If you may become pregnant, take 400 to 800 micrograms (mcg) of folic acid every day.  If you want to prevent pregnancy, talk to your health care provider about birth control (contraception). Osteoporosis and menopause  Osteoporosis is a disease in which the bones lose minerals and strength with aging. This can result in serious bone fractures. Your risk for osteoporosis can be identified using a bone density scan.  If you are 61 years of age or older, or if you are at risk for osteoporosis and fractures, ask your health care provider if you should be screened.  Ask your health care provider whether you should take a calcium or vitamin D supplement to lower your risk for  osteoporosis.  Menopause may have certain physical symptoms and risks.  Hormone replacement therapy may reduce some of these symptoms and risks. Talk to your health care provider about whether hormone replacement therapy is right for you. Follow these instructions at home:  Schedule regular health, dental, and eye exams.  Stay current with your immunizations.  Do not use any tobacco products including cigarettes, chewing tobacco, or electronic cigarettes.  If you are pregnant, do not drink alcohol.  If you are breastfeeding, limit how much and how often you drink alcohol.  Limit alcohol intake to no more than 1 drink per day for nonpregnant women. One drink equals 12 ounces of beer, 5 ounces of wine, or 1 ounces of hard liquor.  Do not use street drugs.  Do not share needles.  Ask your health care provider for help if you need support or information about quitting drugs.  Tell your health care provider if you often feel depressed.  Tell your health care provider if you have ever been abused or do not feel safe at home. This information is not intended to replace advice given to you by your health care provider. Make sure you discuss any questions you have with your health care provider. Document Released: 07/20/2010 Document Revised: 06/12/2015 Document Reviewed: 10/08/2014 Elsevier Interactive Patient Education  [employer].

## 2016-08-31 LAB — PTH, INTACT AND CALCIUM
CALCIUM: 9.3 mg/dL (ref 8.6–10.4)
PTH: 50 pg/mL (ref 14–64)

## 2016-09-28 ENCOUNTER — Ambulatory Visit
Admission: RE | Admit: 2016-09-28 | Discharge: 2016-09-28 | Disposition: A | Discharge status: Home / Self Care | Payer: PPO | Source: Ambulatory Visit | Attending: Family Medicine | Admitting: Family Medicine

## 2016-09-28 DIAGNOSIS — E2839 Other primary ovarian failure: Secondary | ICD-10-CM

## 2016-09-28 DIAGNOSIS — M85852 Other specified disorders of bone density and structure, left thigh: Secondary | ICD-10-CM | POA: Diagnosis not present

## 2016-09-28 DIAGNOSIS — Z1231 Encounter for screening mammogram for malignant neoplasm of breast: Secondary | ICD-10-CM | POA: Diagnosis not present

## 2016-09-28 DIAGNOSIS — Z780 Asymptomatic menopausal state: Secondary | ICD-10-CM | POA: Diagnosis not present

## 2016-10-22 ENCOUNTER — Ambulatory Visit (INDEPENDENT_AMBULATORY_CARE_PROVIDER_SITE_OTHER): Payer: PPO | Admitting: Adult Health

## 2016-10-22 ENCOUNTER — Encounter: Payer: Self-pay | Admitting: Adult Health

## 2016-10-22 VITALS — BP 138/76 | Temp 98.8°F | Ht 59.0 in | Wt 138.0 lb

## 2016-10-22 DIAGNOSIS — J0140 Acute pansinusitis, unspecified: Secondary | ICD-10-CM

## 2016-10-22 MED ORDER — DOXYCYCLINE HYCLATE 100 MG PO CAPS: 100.0000 mg | ORAL_CAPSULE | Freq: Two times a day (BID) | ORAL | 0 refills | Status: DC

## 2016-10-22 NOTE — Progress Notes (Signed)
Subjective:    Patient ID: Diana [name], female    DOB: 09/02/1950, 66 y.o.   MRN: [id number]  URI   This is a new problem. The current episode started in the past 7 days. The problem has been gradually worsening. There has been no fever. Associated symptoms include congestion, coughing, ear pain, neck pain, sinus pain and a sore throat. Pertinent negatives include no headaches, nausea, rhinorrhea or vomiting.    Review of Systems  Constitutional: Positive for activity change and fatigue.  HENT: Positive for congestion, ear pain, sinus pain, sinus pressure and sore throat. Negative for rhinorrhea.   Respiratory: Positive for cough.   Gastrointestinal: Negative for nausea and vomiting.  Musculoskeletal: Positive for neck pain.  Neurological: Negative for headaches.  All other systems reviewed and are negative.  Past Medical History:  Diagnosis Date  . ADD (attention deficit disorder)   . Anxiety   . Cancer ([place])    skin  . Depression   . Diverticulosis   . DJD (degenerative joint disease)   . GERD (gastroesophageal reflux disease)   . History of hiatal hernia   . Hypertension   . Mood disorder [facility])     Social History   Social History  . Marital status: Married    Spouse name: N/A  . Number of children: 1  . Years of education: N/A   Occupational History  . Not on file.   Social History Main Topics  . Smoking status: Former Smoker    Packs/day: 1.00    Years: 20.00    Types: Cigarettes    Quit date: 06/29/2006  . Smokeless tobacco: Never Used  . Alcohol use No     Comment: occ beer  . Drug use: No  . Sexual activity: Not on file   Other Topics Concern  . Not on file   Social History Narrative  . No narrative on file    Past Surgical History:  Procedure Laterality Date  . BREAST IMPLANT REMOVAL    . CESAREAN SECTION    . COLON SURGERY    . HEMORRHOID SURGERY    . LIPOMA EXCISION N/A 10/23/2014   Procedure: EXCISION OF BACK LIPOMA;  Surgeon:  Donnie Mesa, MD;  Location: [place];  Service: General;  Laterality: N/A;  . PARTIAL COLECTOMY    . PILONIDAL CYST EXCISION    . PLACEMENT OF BREAST IMPLANTS    . RHINOPLASTY     x 2  . SHOULDER SURGERY Right    06  . TOTAL ABDOMINAL HYSTERECTOMY      Family History  Problem Relation Age of Onset  . Melanoma Mother   . Kidney cancer Father   . Breast cancer Paternal Grandmother     Allergies  Allergen Reactions  . Ezetimibe     REACTION: statins make joints ache  . Latex Hives  . Morphine Sulfate     REACTION: n \\T \ v    Current Outpatient Prescriptions on File Prior to Visit  Medication Sig Dispense Refill  . b complex vitamins capsule Take 1 capsule by mouth daily.    . Cholecalciferol (VITAMIN D3) 10000 units TABS Take by mouth daily.    Marland Kitchen ibuprofen (ADVIL,MOTRIN) 200 MG tablet Take 400 mg by mouth every 6 (six) hours as needed.    Marland Kitchen omeprazole (PRILOSEC) 40 MG capsule TAKE 1 CAPSULE (40 MG TOTAL) BY MOUTH DAILY. 30 capsule 3  . ranitidine (ZANTAC) 150 MG tablet Take 150 mg by mouth 2 (two)  times daily.    Marland Kitchen triamterene-hydrochlorothiazide (DYAZIDE) 37.5-25 MG capsule TAKE ONE CAPSULE BY MOUTH EVERY DAY 90 capsule 3   No current facility-administered medications on file prior to visit.     BP 138/76 (BP Location: Left Arm)   Temp 98.8 F (37.1 C) (Oral)   Ht 4\' 11"  (1.499 m)   Wt 138 lb (62.6 kg)   BMI 27.87 kg/m       Objective:   Physical Exam  Constitutional: She is oriented to person, place, and time. She appears well-developed and well-nourished. No distress.  HENT:  Head: Normocephalic and atraumatic.  Right Ear: Hearing, external ear and ear canal normal. Tympanic membrane is bulging. Tympanic membrane is not erythematous.  Left Ear: Hearing, tympanic membrane, external ear and ear canal normal.  Nose: No mucosal edema or rhinorrhea. Right sinus exhibits maxillary sinus tenderness and frontal sinus tenderness. Left sinus exhibits maxillary sinus  tenderness and frontal sinus tenderness.  Mouth/Throat: Oropharynx is clear and moist. No oropharyngeal exudate.  Eyes: Pupils are equal, round, and reactive to light. Conjunctivae and EOM are normal. Right eye exhibits no discharge. No scleral icterus.  Cardiovascular: Normal rate, regular rhythm, normal heart sounds and intact distal pulses.  Exam reveals no gallop and no friction rub.   No murmur heard. Pulmonary/Chest: Effort normal and breath sounds normal. No respiratory distress. She has no wheezes. She has no rales. She exhibits no tenderness.  Neurological: She is alert and oriented to person, place, and time.  Skin: Skin is warm and dry. No rash noted. She is not diaphoretic. No erythema. No pallor.  Psychiatric: She has a normal mood and affect. Her behavior is normal. Judgment and thought content normal.  Nursing note and vitals reviewed.     Assessment & Plan:  1. Acute non-recurrent pansinusitis - Exam consistent with sinusitis. Will prescribe doxycyline due to symptoms.  - doxycycline (VIBRAMYCIN) 100 MG capsule; Take 1 capsule (100 mg total) by mouth 2 (two) times daily.  Dispense: 14 capsule; Refill: 0 - Follow up in 2-3 days if no improvement   [employer], AGNP

## 2016-11-06 ENCOUNTER — Encounter: Payer: Self-pay | Admitting: Physician Assistant

## 2016-11-06 ENCOUNTER — Emergency Department (HOSPITAL_COMMUNITY): Admission: EM | Admit: 2016-11-06 | Payer: PPO | Source: Home / Self Care

## 2016-11-06 ENCOUNTER — Ambulatory Visit (HOSPITAL_COMMUNITY)
Admission: RE | Admit: 2016-11-06 | Discharge: 2016-11-06 | Disposition: A | Discharge status: Home / Self Care | Payer: PPO | Source: Ambulatory Visit | Attending: Physician Assistant | Admitting: Physician Assistant

## 2016-11-06 ENCOUNTER — Other Ambulatory Visit: Payer: Self-pay | Admitting: Physician Assistant

## 2016-11-06 ENCOUNTER — Ambulatory Visit (INDEPENDENT_AMBULATORY_CARE_PROVIDER_SITE_OTHER): Payer: PPO | Admitting: Physician Assistant

## 2016-11-06 VITALS — BP 122/84 | HR 87 | Temp 101.2°F | Resp 16 | Ht 60.0 in | Wt 137.6 lb

## 2016-11-06 DIAGNOSIS — M47896 Other spondylosis, lumbar region: Secondary | ICD-10-CM | POA: Insufficient documentation

## 2016-11-06 DIAGNOSIS — R1032 Left lower quadrant pain: Secondary | ICD-10-CM | POA: Diagnosis not present

## 2016-11-06 DIAGNOSIS — Z90710 Acquired absence of both cervix and uterus: Secondary | ICD-10-CM | POA: Insufficient documentation

## 2016-11-06 DIAGNOSIS — K5720 Diverticulitis of large intestine with perforation and abscess without bleeding: Secondary | ICD-10-CM | POA: Insufficient documentation

## 2016-11-06 DIAGNOSIS — K760 Fatty (change of) liver, not elsewhere classified: Secondary | ICD-10-CM | POA: Insufficient documentation

## 2016-11-06 DIAGNOSIS — R52 Pain, unspecified: Secondary | ICD-10-CM

## 2016-11-06 DIAGNOSIS — R197 Diarrhea, unspecified: Secondary | ICD-10-CM | POA: Diagnosis not present

## 2016-11-06 DIAGNOSIS — R1084 Generalized abdominal pain: Secondary | ICD-10-CM | POA: Diagnosis not present

## 2016-11-06 LAB — POCT CBC
Granulocyte percent: 76.9 %G (ref 37–80)
HCT, POC: 38.6 % (ref 37.7–47.9)
HEMOGLOBIN: 12.9 g/dL (ref 12.2–16.2)
Lymph, poc: 2.2 (ref 0.6–3.4)
MCH: 28.6 pg (ref 27–31.2)
MCHC: 33.3 g/dL (ref 31.8–35.4)
MCV: 85.9 fL (ref 80–97)
MID (cbc): 0.7 (ref 0–0.9)
MPV: 6.4 fL (ref 0–99.8)
PLATELET COUNT, POC: 296 10*3/uL (ref 142–424)
POC Granulocyte: 9.5 — AB (ref 2–6.9)
POC LYMPH PERCENT: 17.7 %L (ref 10–50)
POC MID %: 5.4 %M (ref 0–12)
RBC: 4.49 M/uL (ref 4.04–5.48)
RDW, POC: 13.3 %
WBC: 12.3 10*3/uL — AB (ref 4.6–10.2)

## 2016-11-06 LAB — POCT URINALYSIS DIP (MANUAL ENTRY)
Bilirubin, UA: NEGATIVE
Glucose, UA: NEGATIVE mg/dL
Ketones, POC UA: NEGATIVE mg/dL
Leukocytes, UA: NEGATIVE
Nitrite, UA: NEGATIVE
PH UA: 7 (ref 5.0–8.0)
Protein Ur, POC: NEGATIVE mg/dL
RBC UA: NEGATIVE
Spec Grav, UA: 1.015 (ref 1.010–1.025)
UROBILINOGEN UA: 0.2 U/dL

## 2016-11-06 LAB — I-STAT CREATININE, ED: CREATININE: 0.8 mg/dL (ref 0.44–1.00)

## 2016-11-06 MED ORDER — IOPAMIDOL (ISOVUE-300) INJECTION 61%
100.0000 mL | Freq: Once | INTRAVENOUS | Status: AC | PRN
  Administered 2016-11-06: 100 mL via INTRAVENOUS

## 2016-11-06 MED ORDER — AMOXICILLIN-POT CLAVULANATE 875-125 MG PO TABS: 1.0000 | ORAL_TABLET | Freq: Two times a day (BID) | ORAL | 0 refills | Status: DC

## 2016-11-06 NOTE — Progress Notes (Signed)
11/08/2016 8:25 AM   DOB: May 01, 1950 / MRN: [id number]  SUBJECTIVE:  Diana BLACKSHER is a 66 y.o. female presenting for abdominal pain. Says she has a history of diverticulitis however most recent colonoscopy was negative for this. Had a perforation about 10-11 years ago. Tells me this feels like a diverticulitis.  She was on doxy about 2 weeks ago.  Denies diarrhea. Last bowel was yesterday, non bloody and normal. Lost her appetite today and now bowel yet. Denies vaginal discharge.   She has had a hysterectomy.    She is allergic to ezetimibe; latex; and morphine sulfate.   She  has a past medical history of ADD (attention deficit disorder); Anxiety; Cancer ([place]); Depression; Diverticulosis; DJD (degenerative joint disease); GERD (gastroesophageal reflux disease); History of hiatal hernia; Hypertension; and Mood disorder ([place]).    She  reports that she quit smoking about 10 years ago. Her smoking use included Cigarettes. She has a 20.00 pack-year smoking history. She has never used smokeless tobacco. She reports that she does not drink alcohol or use drugs. She  has no sexual activity history on file. The patient  has a past surgical history that includes Total abdominal hysterectomy; Hemorrhoid surgery; Rhinoplasty; Pilonidal cyst excision; Placement of breast implants; Breast implant removal; Shoulder surgery (Right); Partial colectomy; Colon surgery; Cesarean section; and Lipoma excision (N/A, 10/23/2014).  Her family history includes Breast cancer in her paternal grandmother; Kidney cancer in her father; Melanoma in her mother.  Review of Systems  Constitutional: Negative for chills, diaphoresis and fever.  Respiratory: Negative for shortness of breath.   Cardiovascular: Negative for chest pain, orthopnea and leg swelling.  Gastrointestinal: Negative for nausea.  Skin: Negative for rash.  Neurological: Negative for dizziness.    The problem list and medications were reviewed and  updated by myself where necessary and exist elsewhere in the encounter.   OBJECTIVE:  BP 122/84   Pulse 87   Temp (!) 101.2 F (38.4 C) (Oral)   Resp 16   Ht 5' (1.524 m)   Wt 137 lb 9.6 oz (62.4 kg)   SpO2 97%   BMI 26.87 kg/m   Physical Exam  Results for orders placed or performed in visit on 11/06/16 (from the past 72 hour(s))  CMP and Liver     Status: Abnormal   Collection Time: 11/06/16  1:50 PM  Result Value Ref Range   Glucose 122 (H) 65 - 99 mg/dL   BUN 12 8 - 27 mg/dL   Creatinine, Ser 0.87 0.57 - 1.00 mg/dL   GFR calc non Af Amer 69 >59 mL/min/1.73   GFR calc Af Amer 79 >59 mL/min/1.73   Sodium 137 134 - 144 mmol/L   Potassium 3.5 3.5 - 5.2 mmol/L   Chloride 94 (L) 96 - 106 mmol/L   CO2 24 20 - 29 mmol/L   Calcium 9.9 8.7 - 10.3 mg/dL   Total Protein 7.8 6.0 - 8.5 g/dL   Albumin 4.7 3.6 - 4.8 g/dL   Bilirubin Total 0.7 0.0 - 1.2 mg/dL   Bilirubin, Direct 0.16 0.00 - 0.40 mg/dL   Alkaline Phosphatase 156 (H) 39 - 117 IU/L   AST 20 0 - 40 IU/L   ALT 18 0 - 32 IU/L  Lipase     Status: None   Collection Time: 11/06/16  1:50 PM  Result Value Ref Range   Lipase 27 14 - 72 U/L  POCT CBC     Status: Abnormal   Collection  Time: 11/06/16  2:03 PM  Result Value Ref Range   WBC 12.3 (A) 4.6 - 10.2 K/uL   Lymph, poc 2.2 0.6 - 3.4   POC LYMPH PERCENT 17.7 10 - 50 %L   MID (cbc) 0.7 0 - 0.9   POC MID % 5.4 0 - 12 %M   POC Granulocyte 9.5 (A) 2 - 6.9   Granulocyte percent 76.9 37 - 80 %G   RBC 4.49 4.04 - 5.48 M/uL   Hemoglobin 12.9 12.2 - 16.2 g/dL   HCT, POC 38.6 37.7 - 47.9 %   MCV 85.9 80 - 97 fL   MCH, POC 28.6 27 - 31.2 pg   MCHC 33.3 31.8 - 35.4 g/dL   RDW, POC 13.3 %   Platelet Count, POC 296 142 - 424 K/uL   MPV 6.4 0 - 99.8 fL  POCT urinalysis dipstick     Status: None   Collection Time: 11/06/16  2:14 PM  Result Value Ref Range   Color, UA yellow yellow   Clarity, UA clear clear   Glucose, UA negative negative mg/dL   Bilirubin, UA negative  negative   Ketones, POC UA negative negative mg/dL   Spec Grav, UA 1.015 1.010 - 1.025   Blood, UA negative negative   pH, UA 7.0 5.0 - 8.0   Protein Ur, POC negative negative mg/dL   Urobilinogen, UA 0.2 0.2 or 1.0 E.U./dL   Nitrite, UA Negative Negative   Leukocytes, UA Negative Negative     No results found.  ASSESSMENT AND PLAN:  Yesenia was seen today for diverticulitis and fever.  Diagnoses and all orders for this visit:  Left lower quadrant pain: CT does show diverticulitis without abscess or perf.  Will treat with augmentin.  Clear liquids for the next 3-4 days and colace as needed.  She will come back if she has any problems.  -     POCT CBC -     POCT urinalysis dipstick -     CMP and Liver -     Lipase -     amoxicillin-clavulanate (AUGMENTIN) 875-125 MG tablet; Take 1 tablet by mouth 2 (two) times daily. -     CT Abdomen Pelvis W Contrast; Future    The patient is advised to call or return to clinic if she does not see an improvement in symptoms, or to seek the care of the closest emergency department if she worsens with the above plan.   Philis Fendt, MHS, PA-C [facility] 11/08/2016 8:25 AM

## 2016-11-06 NOTE — Patient Instructions (Signed)
Arrive at [facility] ED. Let receptionist know that you have an outpatient CT scheduled.

## 2016-11-07 LAB — CMP AND LIVER
ALBUMIN: 4.7 g/dL (ref 3.6–4.8)
ALK PHOS: 156 IU/L — AB (ref 39–117)
ALT: 18 IU/L (ref 0–32)
AST: 20 IU/L (ref 0–40)
BILIRUBIN, DIRECT: 0.16 mg/dL (ref 0.00–0.40)
BUN: 12 mg/dL (ref 8–27)
Bilirubin Total: 0.7 mg/dL (ref 0.0–1.2)
CO2: 24 mmol/L (ref 20–29)
Calcium: 9.9 mg/dL (ref 8.7–10.3)
Chloride: 94 mmol/L — ABNORMAL LOW (ref 96–106)
Creatinine, Ser: 0.87 mg/dL (ref 0.57–1.00)
GFR calc Af Amer: 79 mL/min/{1.73_m2} (ref >59)
GFR calc non Af Amer: 69 mL/min/{1.73_m2} (ref >59)
Glucose: 122 mg/dL — ABNORMAL HIGH (ref 65–99)
POTASSIUM: 3.5 mmol/L (ref 3.5–5.2)
SODIUM: 137 mmol/L (ref 134–144)
TOTAL PROTEIN: 7.8 g/dL (ref 6.0–8.5)

## 2016-11-07 LAB — LIPASE: LIPASE: 27 U/L (ref 14–72)

## 2016-11-07 NOTE — Progress Notes (Signed)
Diana [name] with patient and she is feeling better. CT negative for abscess and perforation. She will continue augmentin and call or come back to the office if she needs anything. Philis Fendt, MS, PA-C 9:40 AM, 11/07/2016

## 2016-11-08 ENCOUNTER — Inpatient Hospital Stay (HOSPITAL_COMMUNITY): Admission: RE | Admit: 2016-11-08 | Payer: PPO | Source: Ambulatory Visit

## 2017-02-01 ENCOUNTER — Telehealth: Payer: Self-pay

## 2017-02-01 ENCOUNTER — Other Ambulatory Visit: Payer: PPO

## 2017-02-01 NOTE — Telephone Encounter (Signed)
Called pt left a message to return my call in the office in regards to making her an appointment to establish with another provider since Gregary Signs is not in the practice.

## 2017-02-11 ENCOUNTER — Ambulatory Visit (INDEPENDENT_AMBULATORY_CARE_PROVIDER_SITE_OTHER): Payer: PPO | Admitting: Adult Health

## 2017-02-11 ENCOUNTER — Encounter: Payer: Self-pay | Admitting: Adult Health

## 2017-02-11 VITALS — BP 142/86 | Temp 98.4°F | Wt 132.0 lb

## 2017-02-11 DIAGNOSIS — Z7689 Persons encountering health services in other specified circumstances: Secondary | ICD-10-CM

## 2017-02-11 DIAGNOSIS — Z23 Encounter for immunization: Secondary | ICD-10-CM

## 2017-02-11 DIAGNOSIS — E7800 Pure hypercholesterolemia, unspecified: Secondary | ICD-10-CM

## 2017-02-11 DIAGNOSIS — I10 Essential (primary) hypertension: Secondary | ICD-10-CM

## 2017-02-11 DIAGNOSIS — F902 Attention-deficit hyperactivity disorder, combined type: Secondary | ICD-10-CM

## 2017-02-11 DIAGNOSIS — H9313 Tinnitus, bilateral: Secondary | ICD-10-CM

## 2017-02-11 MED ORDER — FENOFIBRATE 48 MG PO TABS
48.0000 mg | ORAL_TABLET | Freq: Every day | ORAL | 1 refills | Status: DC
Start: 2017-02-11 — End: 2017-08-02

## 2017-02-11 NOTE — Progress Notes (Signed)
Patient presents to clinic today to establish care. She is a pleasant 67 year old female who  has a past medical history of ADD (attention deficit disorder), Anxiety, Cancer ([place]), Depression, Diverticulosis, DJD (degenerative joint disease), GERD (gastroesophageal reflux disease), History of hiatal hernia, Hypertension, and Mood disorder ([place]).   Acute Concerns: Establish Care   Tinnitus - Has been constant for the last three years. She was on Wellbutrin for depression and thought that this may have been the cause, she has not been taking wellbutrin for greater than one year and continues to have tinnitus constantly.   Chronic Issues: ADHD - She restarted Adderall 40 mg daily by her psychiatrist in [facility].   Essential Hypertension - She takes Dyazide 37.5 - 25 mg   Depression - does not take any medication - feels as though it is well controlled. She sees psychiatry   Hypercholesterolemia - Not currently on any medications due to joint pain from statins. She has never tried Fenofibrate   Health Maintenance: Dental --  Routine  Vision -- Routine  Immunizations -- Needs Prevnar 23 Colonoscopy -- 03/2015  Mammogram -- UTD  PAP -- No longer needs Bone Density -- UTD  Diet: Does not follow a specific diet but tries to eat healthy Exercise: Plays golf, stays active    Is followed by  - Psychiatry   Past Medical History:  Diagnosis Date  . ADD (attention deficit disorder)   . Anxiety   . Cancer ([place])    skin  . Depression   . Diverticulosis   . DJD (degenerative joint disease)   . GERD (gastroesophageal reflux disease)   . History of hiatal hernia   . Hypertension   . Mood disorder [facility])     Past Surgical History:  Procedure Laterality Date  . BREAST IMPLANT REMOVAL    . CESAREAN SECTION    . COLON SURGERY    . HEMORRHOID SURGERY    . LIPOMA EXCISION N/A 10/23/2014   Procedure: EXCISION OF BACK LIPOMA;  Surgeon: Donnie Mesa, MD;  Location: [place];   Service: General;  Laterality: N/A;  . PARTIAL COLECTOMY    . PILONIDAL CYST EXCISION    . PLACEMENT OF BREAST IMPLANTS    . RHINOPLASTY     x 2  . SHOULDER SURGERY Right    06  . TOTAL ABDOMINAL HYSTERECTOMY      Current Outpatient Medications on File Prior to Visit  Medication Sig Dispense Refill  . Alum Hydroxide-Mag Carbonate (GAVISCON PO) Take by mouth.    Marland Kitchen amphetamine-dextroamphetamine (ADDERALL) 20 MG tablet Take 40 mg by mouth every morning.  0  . Cholecalciferol (VITAMIN D3) 5000 units CAPS Take 1 capsule by mouth daily.     Marland Kitchen ibuprofen (ADVIL,MOTRIN) 200 MG tablet Take 400 mg by mouth every 6 (six) hours as needed.    . L-LYSINE PO Take by mouth. USING FOR MOUTH ULCERS    . Misc Natural Products (TART CHERRY ADVANCED) CAPS Take 2 capsules by mouth daily.    Marland Kitchen triamterene-hydrochlorothiazide (DYAZIDE) 37.5-25 MG capsule TAKE ONE CAPSULE BY MOUTH EVERY DAY 90 capsule 3  . b complex vitamins capsule Take 1 capsule by mouth daily.     No current facility-administered medications on file prior to visit.     Allergies  Allergen Reactions  . Ezetimibe     REACTION: statins make joints ache  . Latex Hives  . Morphine Sulfate     REACTION: n \\T \ v  Family History  Problem Relation Age of Onset  . Melanoma Mother   . Kidney cancer Father   . Breast cancer Paternal Grandmother     Social History   Socioeconomic History  . Marital status: Married    Spouse name: Not on file  . Number of children: 1  . Years of education: Not on file  . Highest education level: Not on file  Social Needs  . Financial resource strain: Not on file  . Food insecurity - worry: Not on file  . Food insecurity - inability: Not on file  . Transportation needs - medical: Not on file  . Transportation needs - non-medical: Not on file  Occupational History  . Not on file  Tobacco Use  . Smoking status: Former Smoker    Packs/day: 1.00    Years: 20.00    Pack years: 20.00    Types:  Cigarettes    Last attempt to quit: 06/29/2006    Years since quitting: 10.6  . Smokeless tobacco: Never Used  Substance and Sexual Activity  . Alcohol use: No    Alcohol/week: 0.0 oz    Comment: occ beer  . Drug use: No  . Sexual activity: Not on file  Other Topics Concern  . Not on file  Social History Narrative  . Not on file    Review of Systems  Constitutional: Negative.   HENT: Positive for tinnitus.   Eyes: Negative.   Respiratory: Negative.   Cardiovascular: Negative.   Genitourinary: Negative.   Skin: Negative.   Neurological: Negative.   All other systems reviewed and are negative.      BP (!) 142/86 (BP Location: Left Leg)   Temp 98.4 F (36.9 C) (Oral)   Wt 132 lb (59.9 kg)   BMI 25.78 kg/m   Physical Exam  Constitutional: She is oriented to person, place, and time and well-developed, well-nourished, and in no distress. No distress.  HENT:  Head: Normocephalic and atraumatic.  Right Ear: External ear normal.  Left Ear: External ear normal.  Nose: Nose normal.  Mouth/Throat: Oropharynx is clear and moist. No oropharyngeal exudate.  Cardiovascular: Normal rate, regular rhythm, normal heart sounds and intact distal pulses. Exam reveals no gallop and no friction rub.  No murmur heard. Pulmonary/Chest: Effort normal and breath sounds normal. No respiratory distress. She has no wheezes. She has no rales. She exhibits no tenderness.  Musculoskeletal: Normal range of motion. She exhibits no edema, tenderness or deformity.  Neurological: She is alert and oriented to person, place, and time. Gait normal. GCS score is 15.  Skin: Skin is warm and dry. No rash noted. She is not diaphoretic. No erythema. No pallor.  Psychiatric: Mood, memory, affect and judgment normal.  Nursing note and vitals reviewed.  Assessment/Plan: 1. Encounter to establish care - Continue to work on diet and exercise  - Follow up in August for CPE  - Follow up sooner if needed  2.  Essential hypertension - Not at goal  - Asked to monitor at home and bring results to CPE   3. Attention deficit hyperactivity disorder (ADHD), combined type - Continue with POC by psychiatry   4. Pure hypercholesterolemia - will trial on Tricor - Recheck lipids at CPE  - fenofibrate (TRICOR) 48 MG tablet; Take 1 tablet (48 mg total) by mouth daily.  Dispense: 90 tablet; Refill: 1  5. Tinnitus of both ears  - Ambulatory referral to Audiology  6. Need for Streptococcus pneumoniae vaccination  -  Pneumococcal polysaccharide vaccine 23-valent greater than or equal to 2yo subcutaneous/IM   Dorothyann Peng, NP

## 2017-02-11 NOTE — Patient Instructions (Signed)
I have sent in Fenofibrate for high cholesterol   Someone will contact you to schedule your appointment for the hearing exam   Follow up with me in August, unless you need something before that

## 2017-03-10 ENCOUNTER — Encounter: Payer: Self-pay | Admitting: Adult Health

## 2017-03-10 ENCOUNTER — Ambulatory Visit (INDEPENDENT_AMBULATORY_CARE_PROVIDER_SITE_OTHER): Payer: PPO | Admitting: Adult Health

## 2017-03-10 VITALS — BP 136/86 | Temp 98.5°F | Wt 131.0 lb

## 2017-03-10 DIAGNOSIS — N644 Mastodynia: Secondary | ICD-10-CM

## 2017-03-10 DIAGNOSIS — R52 Pain, unspecified: Secondary | ICD-10-CM

## 2017-03-10 NOTE — Progress Notes (Addendum)
Subjective:    Patient ID: Diana [name], female    DOB: 1950/11/30, 67 y.o.   MRN: [id number]  HPI  Presents with a 10 day history of right breast pain.  Se noticed this pain when she was changing the bed linens and throwing the sheets  Across the bed. She describes it as a pulling and heavy feeling and a sensation that her nipple is being pulled forward. She cannot contribute this to any kind of trauma or other activity and the pain is unchanged with wearing a bra or going without a bra.  She has a right inverted nipple for 10 years that has been imaged with normal results. Normal mammogram in September 2018. She has had several breast surgeries over the years.    Review of Systems  Constitutional: Negative.   HENT: Negative.   Respiratory: Negative for chest tightness and shortness of breath.   Cardiovascular: Negative for chest pain.  Musculoskeletal: Negative for back pain.  Skin: Negative for color change and rash.   Past Medical History:  Diagnosis Date  . ADD (attention deficit disorder)   . Anxiety   . Cancer ([place])    skin  . Depression   . Diverticulosis   . DJD (degenerative joint disease)   . GERD (gastroesophageal reflux disease)   . History of hiatal hernia   . Hypertension   . Mood disorder [facility])     Social History   Socioeconomic History  . Marital status: Married    Spouse name: Not on file  . Number of children: 1  . Years of education: Not on file  . Highest education level: Not on file  Social Needs  . Financial resource strain: Not on file  . Food insecurity - worry: Not on file  . Food insecurity - inability: Not on file  . Transportation needs - medical: Not on file  . Transportation needs - non-medical: Not on file  Occupational History  . Not on file  Tobacco Use  . Smoking status: Former Smoker    Packs/day: 1.00    Years: 20.00    Pack years: 20.00    Types: Cigarettes    Last attempt to quit: 06/29/2006    Years since quitting:  10.7  . Smokeless tobacco: Never Used  Substance and Sexual Activity  . Alcohol use: No    Alcohol/week: 0.0 oz    Comment: occ beer  . Drug use: No  . Sexual activity: Not on file  Other Topics Concern  . Not on file  Social History Narrative  . Not on file    Past Surgical History:  Procedure Laterality Date  . BREAST IMPLANT REMOVAL    . CESAREAN SECTION    . COLON SURGERY     had about 1 foot of bowel removed   . HEMORRHOID SURGERY    . LIPOMA EXCISION N/A 10/23/2014   Procedure: EXCISION OF BACK LIPOMA;  Surgeon: Donnie Mesa, MD;  Location: [place];  Service: General;  Laterality: N/A;  . PARTIAL COLECTOMY    . PARTIAL HYSTERECTOMY     still has ovaries   . PILONIDAL CYST EXCISION    . PLACEMENT OF BREAST IMPLANTS    . RHINOPLASTY     x 2  . SHOULDER SURGERY Right    06    Family History  Problem Relation Age of Onset  . Melanoma Mother   . Kidney cancer Father   . Breast cancer Paternal Grandmother   .  Cancer Maternal Grandmother        possible uterine?     Allergies  Allergen Reactions  . Ezetimibe     REACTION: statins make joints ache  . Latex Hives  . Morphine Sulfate     REACTION: n \\T \ v    Current Outpatient Medications on File Prior to Visit  Medication Sig Dispense Refill  . Alum Hydroxide-Mag Carbonate (GAVISCON PO) Take by mouth.    Marland Kitchen amphetamine-dextroamphetamine (ADDERALL) 20 MG tablet Take 40 mg by mouth every morning.  0  . b complex vitamins capsule Take 1 capsule by mouth daily.    . Cholecalciferol (VITAMIN D3) 5000 units CAPS Take 1 capsule by mouth daily.     . fenofibrate (TRICOR) 48 MG tablet Take 1 tablet (48 mg total) by mouth daily. 90 tablet 1  . ibuprofen (ADVIL,MOTRIN) 200 MG tablet Take 400 mg by mouth every 6 (six) hours as needed.    . L-LYSINE PO Take by mouth. USING FOR MOUTH ULCERS    . Misc Natural Products (TART CHERRY ADVANCED) CAPS Take 2 capsules by mouth daily.    Marland Kitchen triamterene-hydrochlorothiazide (DYAZIDE)  37.5-25 MG capsule TAKE ONE CAPSULE BY MOUTH EVERY DAY 90 capsule 3   No current facility-administered medications on file prior to visit.     BP 136/86 (BP Location: Left Arm)   Temp 98.5 F (36.9 C) (Oral)   Wt 131 lb (59.4 kg)   BMI 25.58 kg/m      Objective:   Physical Exam  Pulmonary/Chest: Right breast exhibits inverted nipple and tenderness. Right breast exhibits no mass, no nipple discharge and no skin change. Left breast exhibits no inverted nipple, no mass, no nipple discharge, no skin change and no tenderness. Breasts are symmetrical.    No tenderness in the breast tissue with palpation.  She does have tenderness in the intercostal and musculature from the 4 o'clock to 10 o'clock area of the outer breast.    Nursing note and vitals reviewed.      Assessment & Plan:  1. Acute pain of breast Take tylenol or motrin for pain and notify me next week if the pain has not resolved for further work-up.   Temperence Zenor C Shyheem Whitham BSN RN NP student

## 2017-03-10 NOTE — Progress Notes (Signed)
Subjective:    Patient ID: Diana [name], female    DOB: [birth year], 67 y.o.   MRN: [id number]  HPI  67 year old female who  has a past medical history of ADD (attention deficit disorder), Anxiety, Cancer ([place]), Depression, Diverticulosis, DJD (degenerative joint disease), GERD (gastroesophageal reflux disease), History of hiatal hernia, Hypertension, and Mood disorder ([place]). She presents to the office today with the complaint of right breast pain. She reports that the pain started 10 days ago. Pain is not constant and is described as a "heavy, pulling sensation." She does have pain with and without wearing a bra. Did a self breast exam and did not notice an changes. Denies any trauma. Pain is getting no better but no worse either.   10 year history of inverted nipple (R), h/o breast implants with removal.   Review of Systems See HPI   Past Medical History:  Diagnosis Date  . ADD (attention deficit disorder)   . Anxiety   . Cancer ([place])    skin  . Depression   . Diverticulosis   . DJD (degenerative joint disease)   . GERD (gastroesophageal reflux disease)   . History of hiatal hernia   . Hypertension   . Mood disorder [facility])     Social History   Socioeconomic History  . Marital status: Married    Spouse name: Not on file  . Number of children: 1  . Years of education: Not on file  . Highest education level: Not on file  Social Needs  . Financial resource strain: Not on file  . Food insecurity - worry: Not on file  . Food insecurity - inability: Not on file  . Transportation needs - medical: Not on file  . Transportation needs - non-medical: Not on file  Occupational History  . Not on file  Tobacco Use  . Smoking status: Former Smoker    Packs/day: 1.00    Years: 20.00    Pack years: 20.00    Types: Cigarettes    Last attempt to quit: 06/29/2006    Years since quitting: 10.7  . Smokeless tobacco: Never Used  Substance and Sexual Activity  . Alcohol use: No   Alcohol/week: 0.0 oz    Comment: occ beer  . Drug use: No  . Sexual activity: Not on file  Other Topics Concern  . Not on file  Social History Narrative  . Not on file    Past Surgical History:  Procedure Laterality Date  . BREAST IMPLANT REMOVAL    . CESAREAN SECTION    . COLON SURGERY     had about 1 foot of bowel removed   . HEMORRHOID SURGERY    . LIPOMA EXCISION N/A 10/23/2014   Procedure: EXCISION OF BACK LIPOMA;  Surgeon: Donnie Mesa, MD;  Location: [place];  Service: General;  Laterality: N/A;  . PARTIAL COLECTOMY    . PARTIAL HYSTERECTOMY     still has ovaries   . PILONIDAL CYST EXCISION    . PLACEMENT OF BREAST IMPLANTS    . RHINOPLASTY     x 2  . SHOULDER SURGERY Right    06    Family History  Problem Relation Age of Onset  . Melanoma Mother   . Kidney cancer Father   . Breast cancer Paternal Grandmother   . Cancer Maternal Grandmother        possible uterine?     Allergies  Allergen Reactions  . Ezetimibe  REACTION: statins make joints ache  . Latex Hives  . Morphine Sulfate     REACTION: n \\T \ v    Current Outpatient Medications on File Prior to Visit  Medication Sig Dispense Refill  . Alum Hydroxide-Mag Carbonate (GAVISCON PO) Take by mouth.    Marland Kitchen amphetamine-dextroamphetamine (ADDERALL) 20 MG tablet Take 40 mg by mouth every morning.  0  . b complex vitamins capsule Take 1 capsule by mouth daily.    . Cholecalciferol (VITAMIN D3) 5000 units CAPS Take 1 capsule by mouth daily.     . fenofibrate (TRICOR) 48 MG tablet Take 1 tablet (48 mg total) by mouth daily. 90 tablet 1  . ibuprofen (ADVIL,MOTRIN) 200 MG tablet Take 400 mg by mouth every 6 (six) hours as needed.    . L-LYSINE PO Take by mouth. USING FOR MOUTH ULCERS    . Misc Natural Products (TART CHERRY ADVANCED) CAPS Take 2 capsules by mouth daily.    Marland Kitchen triamterene-hydrochlorothiazide (DYAZIDE) 37.5-25 MG capsule TAKE ONE CAPSULE BY MOUTH EVERY DAY 90 capsule 3   No current  facility-administered medications on file prior to visit.     BP 136/86 (BP Location: Left Arm)   Temp 98.5 F (36.9 C) (Oral)   Wt 131 lb (59.4 kg)   BMI 25.58 kg/m       Objective:   Physical Exam  Constitutional: She is oriented to person, place, and time. She appears well-developed and well-nourished. No distress.  Cardiovascular: Normal rate, regular rhythm, normal heart sounds and intact distal pulses. Exam reveals no gallop.  No murmur heard. Pulmonary/Chest: Effort normal and breath sounds normal. No respiratory distress. She has no wheezes. She has no rales. She exhibits no mass, no tenderness, no bony tenderness, no crepitus, no edema and no swelling. Right breast exhibits inverted nipple and tenderness. Right breast exhibits no nipple discharge and no skin change. Breasts are symmetrical.    Musculoskeletal: She exhibits tenderness. She exhibits no deformity.  Neurological: She is alert and oriented to person, place, and time.  Skin: Skin is warm and dry. No rash noted. She is not diaphoretic. No erythema. No pallor.  Psychiatric: She has a normal mood and affect. Her behavior is normal. Judgment and thought content normal.  Nursing note and vitals reviewed.      Assessment & Plan:  1. Acute pain of breast - appears to be intercostal pain - Advised tylenol/motrin through the weekend  - Follow up if pain has not resolved   Dorothyann Peng, NP

## 2017-05-05 ENCOUNTER — Encounter: Payer: Self-pay | Admitting: Adult Health

## 2017-05-05 ENCOUNTER — Ambulatory Visit (INDEPENDENT_AMBULATORY_CARE_PROVIDER_SITE_OTHER): Payer: PPO | Admitting: Adult Health

## 2017-05-05 VITALS — BP 152/86 | Temp 98.8°F | Wt 131.0 lb

## 2017-05-05 DIAGNOSIS — M109 Gout, unspecified: Secondary | ICD-10-CM | POA: Diagnosis not present

## 2017-05-05 LAB — CBC WITH DIFFERENTIAL/PLATELET
Basophils Absolute: 0 10*3/uL (ref 0.0–0.1)
Basophils Relative: 0.5 % (ref 0.0–3.0)
Eosinophils Absolute: 0.1 10*3/uL (ref 0.0–0.7)
Eosinophils Relative: 1 % (ref 0.0–5.0)
HEMATOCRIT: 33.9 % — AB (ref 36.0–46.0)
Hemoglobin: 11.3 g/dL — ABNORMAL LOW (ref 12.0–15.0)
LYMPHS PCT: 25.5 % (ref 12.0–46.0)
Lymphs Abs: 2 10*3/uL (ref 0.7–4.0)
MCHC: 33.5 g/dL (ref 30.0–36.0)
MCV: 80.7 fl (ref 78.0–100.0)
MONOS PCT: 7.8 % (ref 3.0–12.0)
Monocytes Absolute: 0.6 10*3/uL (ref 0.1–1.0)
NEUTROS ABS: 5.1 10*3/uL (ref 1.4–7.7)
Neutrophils Relative %: 65.2 % (ref 43.0–77.0)
PLATELETS: 302 10*3/uL (ref 150.0–400.0)
RBC: 4.2 Mil/uL (ref 3.87–5.11)
RDW: 16 % — ABNORMAL HIGH (ref 11.5–15.5)
WBC: 7.9 10*3/uL (ref 4.0–10.5)

## 2017-05-05 LAB — URIC ACID: Uric Acid, Serum: 5.7 mg/dL (ref 2.4–7.0)

## 2017-05-05 MED ORDER — PREDNISONE 20 MG PO TABS: 20.0000 mg | ORAL_TABLET | Freq: Every day | ORAL | 0 refills | Status: DC

## 2017-05-05 NOTE — Progress Notes (Signed)
Subjective:    Patient ID: Diana [name], female    DOB: [birth year], 67 y.o.   MRN: [id number]  HPI  67 year old female who  has a past medical history of ADD (attention deficit disorder), Anxiety, Cancer ([place]), Depression, Diverticulosis, DJD (degenerative joint disease), GERD (gastroesophageal reflux disease), History of hiatal hernia, Hypertension, and Mood disorder ([place]).  She presents to the office today for concern of gout on 4th toe of left foot. Reports pain, redness, and warmth for the last 8 days. Pain is worse with walking and when her bed sheets touch the toe. Has had gout in the past and reports similar pain. She had sardines about  A day before gout flare   Review of Systems See HPI   Past Medical History:  Diagnosis Date  . ADD (attention deficit disorder)   . Anxiety   . Cancer ([place])    skin  . Depression   . Diverticulosis   . DJD (degenerative joint disease)   . GERD (gastroesophageal reflux disease)   . History of hiatal hernia   . Hypertension   . Mood disorder [facility])     Social History   Socioeconomic History  . Marital status: Married    Spouse name: Not on file  . Number of children: 1  . Years of education: Not on file  . Highest education level: Not on file  Occupational History  . Not on file  Social Needs  . Financial resource strain: Not on file  . Food insecurity:    Worry: Not on file    Inability: Not on file  . Transportation needs:    Medical: Not on file    Non-medical: Not on file  Tobacco Use  . Smoking status: Former Smoker    Packs/day: 1.00    Years: 20.00    Pack years: 20.00    Types: Cigarettes    Last attempt to quit: 06/29/2006    Years since quitting: 10.8  . Smokeless tobacco: Never Used  Substance and Sexual Activity  . Alcohol use: No    Alcohol/week: 0.0 oz    Comment: occ beer  . Drug use: No  . Sexual activity: Not on file  Lifestyle  . Physical activity:    Days per week: Not on file    Minutes per  session: Not on file  . Stress: Not on file  Relationships  . Social connections:    Talks on phone: Not on file    Gets together: Not on file    Attends religious service: Not on file    Active member of club or organization: Not on file    Attends meetings of clubs or organizations: Not on file    Relationship status: Not on file  . Intimate partner violence:    Fear of current or ex partner: Not on file    Emotionally abused: Not on file    Physically abused: Not on file    Forced sexual activity: Not on file  Other Topics Concern  . Not on file  Social History Narrative  . Not on file    Past Surgical History:  Procedure Laterality Date  . BREAST IMPLANT REMOVAL    . CESAREAN SECTION    . COLON SURGERY     had about 1 foot of bowel removed   . HEMORRHOID SURGERY    . LIPOMA EXCISION N/A 10/23/2014   Procedure: EXCISION OF BACK LIPOMA;  Surgeon: Donnie Mesa, MD;  Location:  [facility];  Service: General;  Laterality: N/A;  . PARTIAL COLECTOMY    . PARTIAL HYSTERECTOMY     still has ovaries   . PILONIDAL CYST EXCISION    . PLACEMENT OF BREAST IMPLANTS    . RHINOPLASTY     x 2  . SHOULDER SURGERY Right    06    Family History  Problem Relation Age of Onset  . Melanoma Mother   . Kidney cancer Father   . Breast cancer Paternal Grandmother   . Cancer Maternal Grandmother        possible uterine?     Allergies  Allergen Reactions  . Ezetimibe     REACTION: statins make joints ache  . Latex Hives  . Morphine Sulfate     REACTION: n \\T \ v    Current Outpatient Medications on File Prior to Visit  Medication Sig Dispense Refill  . Alum Hydroxide-Mag Carbonate (GAVISCON PO) Take by mouth.    Marland Kitchen amphetamine-dextroamphetamine (ADDERALL) 20 MG tablet Take 40 mg by mouth every morning.  0  . b complex vitamins capsule Take 1 capsule by mouth daily.    . Cholecalciferol (VITAMIN D3) 5000 units CAPS Take 1 capsule by mouth daily.     . fenofibrate (TRICOR) 48 MG tablet  Take 1 tablet (48 mg total) by mouth daily. 90 tablet 1  . ibuprofen (ADVIL,MOTRIN) 200 MG tablet Take 400 mg by mouth every 6 (six) hours as needed.    . L-LYSINE PO Take by mouth. USING FOR MOUTH ULCERS    . Misc Natural Products (TART CHERRY ADVANCED) CAPS Take 2 capsules by mouth daily.    Marland Kitchen triamterene-hydrochlorothiazide (DYAZIDE) 37.5-25 MG capsule TAKE ONE CAPSULE BY MOUTH EVERY DAY 90 capsule 3   No current facility-administered medications on file prior to visit.     BP (!) 152/86   Temp 98.8 F (37.1 C) (Oral)   Wt 131 lb (59.4 kg)   BMI 25.58 kg/m       Objective:   Physical Exam  Constitutional: She is oriented to person, place, and time. She appears well-developed and well-nourished. No distress.  Pulmonary/Chest: Effort normal.  Neurological: She is alert and oriented to person, place, and time.  Skin: Skin is warm and dry. No rash noted. She is not diaphoretic. There is erythema.  Redness, warmth, swelling, and pain noted to 4th toe of left foot.   Psychiatric: She has a normal mood and affect. Her behavior is normal. Judgment and thought content normal.  Nursing note and vitals reviewed.     Assessment & Plan:  1. Acute gout involving toe of left foot, unspecified cause - Uric Acid - CBC with Differential/Platelet - predniSONE (DELTASONE) 20 MG tablet; Take 1 tablet (20 mg total) by mouth daily with breakfast.  Dispense: 7 tablet; Refill: 0 - Follow up if no improvement in the next 2-3 days   Dorothyann Peng, NP

## 2017-05-18 ENCOUNTER — Ambulatory Visit: Payer: PPO | Attending: Adult Health | Admitting: Audiology

## 2017-05-18 DIAGNOSIS — H93299 Other abnormal auditory perceptions, unspecified ear: Secondary | ICD-10-CM | POA: Insufficient documentation

## 2017-05-18 DIAGNOSIS — H9313 Tinnitus, bilateral: Secondary | ICD-10-CM

## 2017-05-18 DIAGNOSIS — Z01118 Encounter for examination of ears and hearing with other abnormal findings: Secondary | ICD-10-CM | POA: Insufficient documentation

## 2017-05-18 DIAGNOSIS — R94128 Abnormal results of other function studies of ear and other special senses: Secondary | ICD-10-CM | POA: Insufficient documentation

## 2017-05-18 DIAGNOSIS — H903 Sensorineural hearing loss, bilateral: Secondary | ICD-10-CM | POA: Diagnosis not present

## 2017-05-18 NOTE — Procedures (Signed)
[facility]  [address], [place] [address]  [phone]   Audiological Evaluation  Patient Name: Diana [name]   Status: Outpatient   DOB: Mar 11, 1950    Diagnosis: Tinnitus MRN: [id number] Date:  05/18/2017     Referent: Diana Peng, NP  History: Diana [name] was seen for an audiological evaluation. Primary Concern: "Loud Tinnitus in each ear" Pain: None History of hearing problems: N History of ear infections:  Y - severe as a young child with "bleeding". History of vertigo:   Y - intermittent but was previously diagnosed with "BPPV".  Sometimes comes back "for a day" - I need to sit up and sit still for a day and take dramamine. History of balance issues:  N Tinnitus: Has had tinnitus for "3-4 years". "High pitched, equal in both ears". Rates the tinnitus as "8" on a scale of (1-10). Sound sensitivity: Not in general, but to "loud whistles at a ball games". History of occupational noise exposure: A little -"Worked in a dental office".  History of hypertension: N History of diabetes:  N Family history of hearing loss: Maternal grandmother did in old age.    Evaluation: Conventional pure tone audiometry from 250Hz  - 8000Hz  with using insert earphones.  Hearing Thresholds are symmetrical ranging from 30-35 dBHL at 250Hz ; 5-15 dBHL from 500Hz  -2000Hz ; 20-25 dBHL at 4000Hz ; 40 dBHL at 6000Hz  and 55-60 dBHL at 8000Hz . The hearing loss appears sensorineural bilaterally. Reliability is good Speech reception levels (repeating words near threshold) using recorded spondee word lists:  Right ear: 15 dBHL.  Left ear:  15 dBHL Word recognition (at comfortably loud volumes) using recorded NU-6 word lists at 55 dBHL, in quiet.  Right ear: 100%.  Left ear:   100% Word recognition in minimal background noise:  +5 dBHL  Right ear: 68%                              Left ear:  68%  Tympanometry shows normal middle ear volume, pressure and  compliance (Type A) bilaterally. Ipsilateral acoustic reflexes from 500Hz  - 4000Hz  are 90dB on the right side and 90-95 dB on the left side.    Tinnitus matching appear is "slightly higher" pitched than 8000Hz . However, since hearing thresholds were no response at 10kHz, tinnitus matching was not completed. However, since Diana [name] states that her tinnitus is "bothersome" and rates it as "8" on a scale from "1 (no problem) to 10 (severe), she may be a good candidate for tinnitus masking  or tinnitus intervention.  CONCLUSION:     Diana [name] has a borderline mild very low frequency hearing loss, but most of her speech range has hearing thresholds within normal limits with a moderately to moderately severe high frequency sloping sensorineural hearing loss bilaterally.  This amount of hearing loss causes Diana [name] to miss some significant speech sounds (f,s,th) which would adversely affect speech communication, especially in background noise.   Word recognition is excellent in each ear in quiet at conversational speech levels, but drops to poor in minimal background noise bilaterally. Missing 30% of what is said in most social situations is expected, possibly more with fluctuating background noise.      Diana [name] has already discovered the importance of masking the tinnitus and trying to not focus on it, but Diana [name] states that she is aware of the tinnitus most of the time unless in a noisy environment.  Continued use of these techniques was recommended. The [employer] Relax tinnitus app was also recommended. In addition, Diana [name] may be a candidate for tinnitus therapies and/or benefit from amplification; therefore a hearing aid/tinnitus evaluation is recommended.  The test results were discussed and Diana [name] counseled.   RECOMMENDATIONS: 1.   Consider tinnitus treatment with tinnitus masking, a hearing aid and or tinnitus therapy available at audiologists in [place] as well as the [facility] [phone]).  2. To minimize the adverse effects of tinnitus 1) avoid quiet  2) use noise maskers at home such as a sound machine, quiet music, a fan or other background noise at a volume just loud enough to mask the high pitched tinnitus. 3) If the tinnitus becomes more bothersome, adversely affecting your sleep or concentration, contact your physician,  seek additional medical help by an ENT for further treatment of your tinnitus. 3.  Strategies that help improve hearing in background noise include: A) Face the speaker directly. Optimal is having the speakers face well - lit.  Unless amplified, being within 3-6 feet of the speaker will enhance word recognition. B) Avoid having the speaker back-lit as this will minimize the ability to use cues from lip-reading, facial expression and gestures. C)  Word recognition is poorer in background noise. For optimal word recognition, turn off the TV, radio or noisy fan when engaging in conversation. In a restaurant, try to sit away from noise sources and close to the primary speaker.  D)  Ask for topic clarification from time to time in order to remain in the conversation.  Most people don't mind repeating or clarifying a point when asked.  If needed, explain the difficulty hearing in background noise or hearing loss. 4.   Use hearing protection during noisy activities such as using a weed eater, moving the lawn, shooting, etc.    Musician's plugs, are available from [employer] for music related hearing protection because there is no distortion.  Other hearing protection, such as sponge plugs (available at pharmacies) or earmuffs (available at sporting goods stores or department stores such as Paediatric nurse) are useful for noisy activities and venues. 5.   Monitor hearing, tinnitus and intermittant vertigo (note she was previously treated for BPPV) with a repeat audiological evaluation in 12 months (earlier if there is any change in hearing or ear pressure).     Diana [name] Au.D., CCC-A Doctor of Audiology  05/18/2017 cc: Diana Peng, NP

## 2017-07-08 ENCOUNTER — Other Ambulatory Visit: Payer: Self-pay | Admitting: Family Medicine

## 2017-07-18 ENCOUNTER — Telehealth: Payer: Self-pay | Admitting: Adult Health

## 2017-07-18 NOTE — Telephone Encounter (Signed)
Copied from [place] [id number]. Topic: General - Other >> Jul 18, 2017  1:09 PM Keene Breath wrote: Reason for CRM: Patient called to request a refill on the predniSONE (DELTASONE) 20 MG tablet for flare up of her gout.  Patient stated that this really took the pain away when she had it before.    Preferred pharmacy is [id number] [address], [place] (Phone) [phone] (Fax)

## 2017-07-19 ENCOUNTER — Telehealth: Payer: Self-pay | Admitting: *Deleted

## 2017-07-19 NOTE — Telephone Encounter (Signed)
Would need OV  With any provider to  Evaluate

## 2017-07-19 NOTE — Telephone Encounter (Signed)
Last treated 05/05/17 by Tommi Rumps, was given Prednisone and labs were checked.   Please advise Dr Regis Bill if you are able to prescribe Prednisone again or if OV is needed to evaluate again.   Thanks!

## 2017-07-19 NOTE — Telephone Encounter (Signed)
Pt scheduled with Dr [place]ique 07/20/17 Nothing further needed.

## 2017-07-19 NOTE — Telephone Encounter (Signed)
Left message to call back to discuss symptoms and need for medication.

## 2017-07-19 NOTE — Telephone Encounter (Signed)
Spoke with patient; message sent to office re: symptoms, med request.

## 2017-07-19 NOTE — Telephone Encounter (Signed)
Pt requesting RX of prednisone. States prescribed by C. Nafziger 04/25/17 for gout. States same toe joint, left foot 4th toe, is "Flaring up again." States the prednisone helped immediately and has not reoccurred until now.  States joint is red, warm to touch "But not nearly as bad as before." States she would like to start the prednisone "Before it gets worse."   Please advise: [phone]

## 2017-07-20 ENCOUNTER — Encounter: Payer: Self-pay | Admitting: Family Medicine

## 2017-07-20 ENCOUNTER — Ambulatory Visit (INDEPENDENT_AMBULATORY_CARE_PROVIDER_SITE_OTHER): Payer: PPO | Admitting: Family Medicine

## 2017-07-20 VITALS — BP 120/76 | HR 86 | Temp 98.8°F | Resp 12 | Ht 60.0 in | Wt 131.4 lb

## 2017-07-20 DIAGNOSIS — M109 Gout, unspecified: Secondary | ICD-10-CM | POA: Diagnosis not present

## 2017-07-20 DIAGNOSIS — M79675 Pain in left toe(s): Secondary | ICD-10-CM

## 2017-07-20 MED ORDER — PREDNISONE 20 MG PO TABS: ORAL_TABLET | ORAL | 0 refills | Status: AC

## 2017-07-20 NOTE — Patient Instructions (Addendum)
  Diana [name] I have seen you today for an acute visit.  A few things to remember from today's visit:   Pain of toe of left foot  Acute gout involving toe of left foot, unspecified cause - Plan: predniSONE (DELTASONE) 20 MG tablet   Medications prescribed today are intended for short period of time and will not be refill upon request, a follow up appointment might be necessary to discuss continuation of of treatment if appropriate.     In general please monitor for signs of worsening symptoms and seek immediate medical attention if any concerning.  If symptoms are not resolved in 7-10 days you should schedule a follow up appointment with your doctor, before if needed.  I hope you get better soon!

## 2017-07-20 NOTE — Progress Notes (Signed)
ACUTE VISIT   HPI:  Chief Complaint  Patient presents with  . Toe Pain    left 4th toe gout flare up    Diana [name] is a 67 y.o. female, who is here today complaining of left toe edema, erythema, and tenderness. Problem started about 3 days ago at night, next day she noted the problem was rapidly getting worse.  She is reporting history of gout, this is the second episode this year.  Same symptoms in 04/2017, greatly improved a day after starting prednisone.   Throbbing and burning moderate to severe pain Pain is constant aggravated by walking,palpation, and movement. Alleviated by rest.  She is trying to follow low purine diet.  No history of trauma. No fever, chills, myalgias, or other joint involvement. He has taken OTC Motrin  She feels better today.  Review of Systems  Constitutional: Negative for chills and fever.  Respiratory: Negative for shortness of breath and wheezing.   Cardiovascular: Negative for chest pain, palpitations and leg swelling.  Musculoskeletal: Positive for arthralgias and joint swelling.  Skin: Positive for color change. Negative for rash and wound.  Neurological: Negative for weakness and numbness.      Current Outpatient Medications on File Prior to Visit  Medication Sig Dispense Refill  . Alum Hydroxide-Mag Carbonate (GAVISCON PO) Take by mouth.    Marland Kitchen amphetamine-dextroamphetamine (ADDERALL XR) 20 MG 24 hr capsule TAKE 1 TO 2 CAPSULES BY MOUTH EVERY MORNING  0  . Cholecalciferol (VITAMIN D3) 5000 units CAPS Take 1 capsule by mouth daily.     . fenofibrate (TRICOR) 48 MG tablet Take 1 tablet (48 mg total) by mouth daily. 90 tablet 1  . ibuprofen (ADVIL,MOTRIN) 200 MG tablet Take 400 mg by mouth every 6 (six) hours as needed.    . L-LYSINE PO Take by mouth. USING FOR MOUTH ULCERS    . Misc Natural Products (TART CHERRY ADVANCED) CAPS Take 2 capsules by mouth daily.    . Multiple Vitamins-Minerals (MULTIVITAMIN ADULTS PO)  Take by mouth daily.     No current facility-administered medications on file prior to visit.      Past Medical History:  Diagnosis Date  . ADD (attention deficit disorder)   . Anxiety   . Cancer ([place])    skin  . Depression   . Diverticulosis   . DJD (degenerative joint disease)   . GERD (gastroesophageal reflux disease)   . History of hiatal hernia   . Hypertension   . Mood disorder ([facility])    Allergies  Allergen Reactions  . Ezetimibe     REACTION: statins make joints ache  . Latex Hives  . Morphine Sulfate     REACTION: n \\T \ v    Social History   Socioeconomic History  . Marital status: Married    Spouse name: Not on file  . Number of children: 1  . Years of education: Not on file  . Highest education level: Not on file  Occupational History  . Not on file  Social Needs  . Financial resource strain: Not on file  . Food insecurity:    Worry: Not on file    Inability: Not on file  . Transportation needs:    Medical: Not on file    Non-medical: Not on file  Tobacco Use  . Smoking status: Former Smoker    Packs/day: 1.00    Years: 20.00    Pack years: 20.00    Types: Cigarettes  Last attempt to quit: 06/29/2006    Years since quitting: 11.0  . Smokeless tobacco: Never Used  Substance and Sexual Activity  . Alcohol use: No    Alcohol/week: 0.0 oz    Comment: occ beer  . Drug use: No  . Sexual activity: Not on file  Lifestyle  . Physical activity:    Days per week: Not on file    Minutes per session: Not on file  . Stress: Not on file  Relationships  . Social connections:    Talks on phone: Not on file    Gets together: Not on file    Attends religious service: Not on file    Active member of club or organization: Not on file    Attends meetings of clubs or organizations: Not on file    Relationship status: Not on file  Other Topics Concern  . Not on file  Social History Narrative  . Not on file    Vitals:   07/20/17 1041  BP: 120/76    Pulse: 86  Resp: 12  Temp: 98.8 F (37.1 C)  SpO2: 98%   Body mass index is 25.66 kg/m.   Physical Exam  Nursing note and vitals reviewed. Constitutional: She is oriented to person, place, and time. She appears well-developed and well-nourished. She does not appear ill. No distress.  HENT:  Head: Normocephalic and atraumatic.  Eyes: Conjunctivae are normal.  Cardiovascular: Normal rate and regular rhythm.  Pulses:      Dorsalis pedis pulses are 2+ on the left side.       Posterior tibial pulses are 2+ on the left side.  Respiratory: Effort normal. No respiratory distress.  Musculoskeletal: She exhibits edema and tenderness.       Left foot: There is decreased range of motion, tenderness and swelling. There is normal capillary refill.  Left foot,4th toe: edema and erythema of IP joint and tenderness upon light palpation. No tenderness with palpation of MTP joint.  Neurological: She is alert and oriented to person, place, and time. She has normal strength. Gait normal.  Skin: Skin is warm. No rash noted. There is erythema.  Psychiatric: She has a normal mood and affect.  Well groomed, good eye contact.      ASSESSMENT AND PLAN:   Diana [name] was seen today for toe pain.  Diagnoses and all orders for this visit:  Pain of toe of left foot  Acute gout involving toe of left foot, unspecified cause -     predniSONE (DELTASONE) 20 MG tablet; 2 tabs for 3 days then 1 daily until gone.   We discussed differential diagnosis. Given the fact there is no history of trauma I do not think imaging is needed today. Clinical presentation as well as prior history suggest another episode of acute gout. Some side effects of prednisone discussed, last time she tolerated it well. Lower extremity elevation. Instructed about warning signs. Continue low proteins diet. If she has a third episode, allopurinol may need to be considered.    Return if symptoms worsen or fail to  improve.     Shoshanah Dapper G. [place], MD  [facility]. [place] office.

## 2017-08-01 ENCOUNTER — Telehealth: Payer: Self-pay | Admitting: Adult Health

## 2017-08-01 NOTE — Telephone Encounter (Signed)
Copied from [place] [id number]. Topic: Quick Communication - Rx Refill/Question >> Aug 01, 2017  3:39 PM Synthia Innocent wrote: Medication: fenofibrate (TRICOR) 48 MG tablet   Has the patient contacted their pharmacy? Yes.   (Agent: If no, request that the patient contact the pharmacy for the refill.) (Agent: If yes, when and what did the pharmacy advise?)  Preferred Pharmacy (with phone number or street name): [facility] [address]  Agent: Please be advised that RX refills may take up to 3 business days. We ask that you follow-up with your pharmacy.

## 2017-08-02 ENCOUNTER — Other Ambulatory Visit: Payer: Self-pay | Admitting: *Deleted

## 2017-08-02 DIAGNOSIS — E7800 Pure hypercholesterolemia, unspecified: Secondary | ICD-10-CM

## 2017-08-02 MED ORDER — FENOFIBRATE 48 MG PO TABS: 48.0000 mg | ORAL_TABLET | Freq: Every day | ORAL | 0 refills | Status: DC

## 2017-08-02 NOTE — Telephone Encounter (Signed)
Rx refilled per protocol ( #90 no additional)- LOV: 02/11/17   - next visit 08/31/17

## 2017-08-05 ENCOUNTER — Other Ambulatory Visit: Payer: Self-pay | Admitting: Adult Health

## 2017-08-05 DIAGNOSIS — E7800 Pure hypercholesterolemia, unspecified: Secondary | ICD-10-CM

## 2017-08-05 NOTE — Telephone Encounter (Signed)
Filled on 08/02/17 for 90 day supply by [facility].

## 2017-08-31 ENCOUNTER — Encounter: Payer: PPO | Admitting: Adult Health

## 2017-09-14 ENCOUNTER — Encounter: Payer: Self-pay | Admitting: Adult Health

## 2017-09-14 ENCOUNTER — Ambulatory Visit (INDEPENDENT_AMBULATORY_CARE_PROVIDER_SITE_OTHER): Payer: PPO | Admitting: Adult Health

## 2017-09-14 VITALS — BP 130/86 | Temp 98.7°F | Ht 59.0 in | Wt 133.0 lb

## 2017-09-14 DIAGNOSIS — M109 Gout, unspecified: Secondary | ICD-10-CM

## 2017-09-14 MED ORDER — PREDNISONE 20 MG PO TABS: ORAL_TABLET | ORAL | 0 refills | Status: DC

## 2017-09-14 NOTE — Progress Notes (Signed)
Subjective:    Patient ID: Diana [name], female    DOB: [birth year], 67 y.o.   MRN: [id number]  HPI   67 year old female who  has a past medical history of ADD (attention deficit disorder), Anxiety, Cancer ([place]), Depression, Diverticulosis, DJD (degenerative joint disease), GERD (gastroesophageal reflux disease), History of hiatal hernia, Hypertension, and Mood disorder ([place]).   She presents to the office today for an acute issue of possible gout flare. She reports that her symptoms started last night. Pain is located in left  great toe Associated symptoms include that of redness, warmth, and swelling. This is her third episode this year. Pain is constant and feels throbbing and burning. Worse with walking, palpation, and movement    She has been working on diet but had shellfish last week. Denies any alcohol use.    Review of Systems  Constitutional: Negative.   Eyes: Negative.   Endocrine: Negative.   Musculoskeletal: Positive for arthralgias and joint swelling.  Allergic/Immunologic: Negative.    Past Medical History:  Diagnosis Date  . ADD (attention deficit disorder)   . Anxiety   . Cancer ([place])    skin  . Depression   . Diverticulosis   . DJD (degenerative joint disease)   . GERD (gastroesophageal reflux disease)   . History of hiatal hernia   . Hypertension   . Mood disorder [facility])     Social History   Socioeconomic History  . Marital status: Married    Spouse name: Not on file  . Number of children: 1  . Years of education: Not on file  . Highest education level: Not on file  Occupational History  . Not on file  Social Needs  . Financial resource strain: Not on file  . Food insecurity:    Worry: Not on file    Inability: Not on file  . Transportation needs:    Medical: Not on file    Non-medical: Not on file  Tobacco Use  . Smoking status: Former Smoker    Packs/day: 1.00    Years: 20.00    Pack years: 20.00    Types: Cigarettes    Last attempt  to quit: 06/29/2006    Years since quitting: 11.2  . Smokeless tobacco: Never Used  Substance and Sexual Activity  . Alcohol use: No    Alcohol/week: 0.0 standard drinks    Comment: occ beer  . Drug use: No  . Sexual activity: Not on file  Lifestyle  . Physical activity:    Days per week: Not on file    Minutes per session: Not on file  . Stress: Not on file  Relationships  . Social connections:    Talks on phone: Not on file    Gets together: Not on file    Attends religious service: Not on file    Active member of club or organization: Not on file    Attends meetings of clubs or organizations: Not on file    Relationship status: Not on file  . Intimate partner violence:    Fear of current or ex partner: Not on file    Emotionally abused: Not on file    Physically abused: Not on file    Forced sexual activity: Not on file  Other Topics Concern  . Not on file  Social History Narrative  . Not on file    Past Surgical History:  Procedure Laterality Date  . BREAST IMPLANT REMOVAL    . CESAREAN  SECTION    . COLON SURGERY     had about 1 foot of bowel removed   . HEMORRHOID SURGERY    . LIPOMA EXCISION N/A 10/23/2014   Procedure: EXCISION OF BACK LIPOMA;  Surgeon: Donnie Mesa, MD;  Location: [facility];  Service: General;  Laterality: N/A;  . PARTIAL COLECTOMY    . PARTIAL HYSTERECTOMY     still has ovaries   . PILONIDAL CYST EXCISION    . PLACEMENT OF BREAST IMPLANTS    . RHINOPLASTY     x 2  . SHOULDER SURGERY Right    06    Family History  Problem Relation Age of Onset  . Melanoma Mother   . Kidney cancer Father   . Breast cancer Paternal Grandmother   . Cancer Maternal Grandmother        possible uterine?     Allergies  Allergen Reactions  . Ezetimibe     REACTION: statins make joints ache  . Latex Hives  . Morphine Sulfate     REACTION: n \\T \ v    Current Outpatient Medications on File Prior to Visit  Medication Sig Dispense Refill  . Alum  Hydroxide-Mag Carbonate (GAVISCON PO) Take by mouth.    Marland Kitchen amphetamine-dextroamphetamine (ADDERALL XR) 20 MG 24 hr capsule TAKE 1 TO 2 CAPSULES BY MOUTH EVERY MORNING  0  . Cholecalciferol (VITAMIN D3) 5000 units CAPS Take 1 capsule by mouth daily.     . fenofibrate (TRICOR) 48 MG tablet Take 1 tablet (48 mg total) by mouth daily. 90 tablet 0  . ibuprofen (ADVIL,MOTRIN) 200 MG tablet Take 400 mg by mouth every 6 (six) hours as needed.    . L-LYSINE PO Take by mouth. USING FOR MOUTH ULCERS    . Misc Natural Products (TART CHERRY ADVANCED) CAPS Take 2 capsules by mouth daily.    . Multiple Vitamins-Minerals (MULTIVITAMIN ADULTS PO) Take by mouth daily.     No current facility-administered medications on file prior to visit.     There were no vitals taken for this visit.      Objective:   Physical Exam  Constitutional: She appears well-developed and well-nourished. No distress.  Musculoskeletal: She exhibits edema and tenderness.  Skin: Skin is warm and dry. Capillary refill takes less than 2 seconds. No rash noted. She is not diaphoretic. There is erythema. No pallor.  Redness, warmth, edema, and pain noted to left MTP joint   Psychiatric: She has a normal mood and affect. Her behavior is normal. Judgment and thought content normal.  Nursing note and vitals reviewed.     Assessment & Plan:  1. Acute gout involving toe of left foot, unspecified cause - predniSONE (DELTASONE) 20 MG tablet; Take two tabs x 3 days and then 1 until gone  Dispense: 10 tablet; Refill: 0 - She is going to follow up in one month for CPE  - Will discuss starting on Allopurinol at that time as this is becoming more frequent affair   Dorothyann Peng, NP

## 2017-10-27 ENCOUNTER — Encounter: Payer: Self-pay | Admitting: Adult Health

## 2017-10-27 ENCOUNTER — Other Ambulatory Visit: Payer: Self-pay | Admitting: Adult Health

## 2017-10-27 ENCOUNTER — Ambulatory Visit (INDEPENDENT_AMBULATORY_CARE_PROVIDER_SITE_OTHER): Payer: PPO | Admitting: Adult Health

## 2017-10-27 VITALS — BP 134/86 | HR 74 | Temp 98.1°F | Ht 59.15 in | Wt 138.0 lb

## 2017-10-27 DIAGNOSIS — I10 Essential (primary) hypertension: Secondary | ICD-10-CM | POA: Diagnosis not present

## 2017-10-27 DIAGNOSIS — Z1159 Encounter for screening for other viral diseases: Secondary | ICD-10-CM

## 2017-10-27 DIAGNOSIS — M109 Gout, unspecified: Secondary | ICD-10-CM | POA: Diagnosis not present

## 2017-10-27 DIAGNOSIS — Z0000 Encounter for general adult medical examination without abnormal findings: Secondary | ICD-10-CM | POA: Diagnosis not present

## 2017-10-27 DIAGNOSIS — E7800 Pure hypercholesterolemia, unspecified: Secondary | ICD-10-CM

## 2017-10-27 DIAGNOSIS — F902 Attention-deficit hyperactivity disorder, combined type: Secondary | ICD-10-CM

## 2017-10-27 DIAGNOSIS — R7309 Other abnormal glucose: Secondary | ICD-10-CM

## 2017-10-27 LAB — BASIC METABOLIC PANEL
BUN: 21 mg/dL (ref 6–23)
CO2: 27 mEq/L (ref 19–32)
Calcium: 10.1 mg/dL (ref 8.4–10.5)
Chloride: 105 mEq/L (ref 96–112)
Creatinine, Ser: 0.98 mg/dL (ref 0.40–1.20)
GFR: 59.71 mL/min — AB (ref >60.00)
GLUCOSE: 111 mg/dL — AB (ref 70–99)
Potassium: 4.3 mEq/L (ref 3.5–5.1)
Sodium: 142 mEq/L (ref 135–145)

## 2017-10-27 LAB — HEPATIC FUNCTION PANEL
ALT: 21 U/L (ref 0–35)
AST: 25 U/L (ref 0–37)
Albumin: 4.7 g/dL (ref 3.5–5.2)
Alkaline Phosphatase: 91 U/L (ref 39–117)
Bilirubin, Direct: 0.1 mg/dL (ref 0.0–0.3)
Total Bilirubin: 0.3 mg/dL (ref 0.2–1.2)
Total Protein: 7.4 g/dL (ref 6.0–8.3)

## 2017-10-27 LAB — HEMOGLOBIN A1C: Hgb A1c MFr Bld: 6.1 % (ref 4.6–6.5)

## 2017-10-27 LAB — LIPID PANEL
CHOLESTEROL: 200 mg/dL (ref 0–200)
HDL: 58.3 mg/dL (ref >39.00)
LDL Cholesterol: 114 mg/dL — ABNORMAL HIGH (ref 0–99)
NonHDL: 141.59
TRIGLYCERIDES: 140 mg/dL (ref 0.0–149.0)
Total CHOL/HDL Ratio: 3
VLDL: 28 mg/dL (ref 0.0–40.0)

## 2017-10-27 LAB — CBC WITH DIFFERENTIAL/PLATELET
Basophils Absolute: 0.1 10*3/uL (ref 0.0–0.1)
Basophils Relative: 1.3 % (ref 0.0–3.0)
EOS PCT: 2 % (ref 0.0–5.0)
Eosinophils Absolute: 0.1 10*3/uL (ref 0.0–0.7)
HCT: 35.7 % — ABNORMAL LOW (ref 36.0–46.0)
Hemoglobin: 11.7 g/dL — ABNORMAL LOW (ref 12.0–15.0)
LYMPHS ABS: 1.7 10*3/uL (ref 0.7–4.0)
Lymphocytes Relative: 36.4 % (ref 12.0–46.0)
MCHC: 32.9 g/dL (ref 30.0–36.0)
MCV: 79.6 fl (ref 78.0–100.0)
MONO ABS: 0.3 10*3/uL (ref 0.1–1.0)
Monocytes Relative: 6.2 % (ref 3.0–12.0)
NEUTROS ABS: 2.5 10*3/uL (ref 1.4–7.7)
NEUTROS PCT: 54.1 % (ref 43.0–77.0)
PLATELETS: 258 10*3/uL (ref 150.0–400.0)
RBC: 4.48 Mil/uL (ref 3.87–5.11)
RDW: 16.9 % — AB (ref 11.5–15.5)
WBC: 4.6 10*3/uL (ref 4.0–10.5)

## 2017-10-27 LAB — TSH: TSH: 4.82 u[IU]/mL — ABNORMAL HIGH (ref 0.35–4.50)

## 2017-10-27 LAB — URIC ACID: Uric Acid, Serum: 6.1 mg/dL (ref 2.4–7.0)

## 2017-10-27 NOTE — Addendum Note (Signed)
Addended by: Rene Kocher on: 10/27/2017 02:53 PM   Modules accepted: Orders

## 2017-10-27 NOTE — Progress Notes (Signed)
Subjective:    Patient ID: Diana [name], female    DOB: 20-Feb-1950, 67 y.o.   MRN: [id number]  HPI Patient presents for yearly preventative medicine examination. She is a pleasant 67 year old female who  has a past medical history of ADD (attention deficit disorder), Anxiety, Cancer ([place]), Depression, Diverticulosis, DJD (degenerative joint disease), GERD (gastroesophageal reflux disease), History of hiatal hernia, Hypertension, and Mood disorder ([place]).   ADHD - Managed by a psychiatry in [place], currently prescribed Adderall 40 mg daily  Essential Hypertension -takes Dyazide 37.5-25 mg.  Denies any dizziness, lightheadedness, chest pain, shortness of breath, headaches BP Readings from Last 3 Encounters:  10/27/17 134/86  09/14/17 130/86  07/20/17 120/76   Depression -she is followed by psychiatry.  Does not take any medications and feels as though she is well controlled  Hyperlipidemia -statin intolerant.  She is currently on fenofibrate 48 mg and tolerating this well Lab Results  Component Value Date   CHOL 256 (H) 08/30/2016   HDL 45.50 08/30/2016   LDLCALC 181 (H) 03/20/2013   LDLDIRECT 171.0 08/30/2016   TRIG 338.0 (H) 08/30/2016   CHOLHDL 6 08/30/2016   Gout - She has had three gout flares this year, her last being in August. She was taken off HCTZ just prior. She has not had any issue with gout since.   All immunizations and health maintenance protocols were reviewed with the patient and needed orders were placed.  Appropriate screening laboratory values were ordered for the patient including screening of hyperlipidemia, renal function and hepatic function.  Medication reconciliation,  past medical history, social history, problem list and allergies were reviewed in detail with the patient  Goals were established with regard to weight loss, exercise, and  diet in compliance with medications. She has been walking and has started changing her diet.   Wt Readings  from Last 3 Encounters:  10/27/17 138 lb (62.6 kg)  09/14/17 133 lb (60.3 kg)  07/20/17 131 lb 6 oz (59.6 kg)   End of life planning was discussed.  She is up-to-date on routine health maintenance items such as colonoscopy, mammogram, dental and vision screens.   She has her yearly dermatology appointment coming up  Review of Systems  Constitutional: Negative.   HENT: Negative.   Eyes: Negative.   Respiratory: Negative.   Cardiovascular: Negative.   Gastrointestinal: Negative.   Endocrine: Negative.   Genitourinary: Negative.   Musculoskeletal: Negative.   Skin: Negative.   Allergic/Immunologic: Negative.   Neurological: Negative.   Hematological: Negative.   Psychiatric/Behavioral: Negative.    Past Medical History:  Diagnosis Date  . ADD (attention deficit disorder)   . Anxiety   . Cancer ([place])    skin  . Depression   . Diverticulosis   . DJD (degenerative joint disease)   . GERD (gastroesophageal reflux disease)   . History of hiatal hernia   . Hypertension   . Mood disorder [facility])     Social History   Socioeconomic History  . Marital status: Married    Spouse name: Not on file  . Number of children: 1  . Years of education: Not on file  . Highest education level: Not on file  Occupational History  . Not on file  Social Needs  . Financial resource strain: Not on file  . Food insecurity:    Worry: Not on file    Inability: Not on file  . Transportation needs:    Medical: Not on file  Non-medical: Not on file  Tobacco Use  . Smoking status: Former Smoker    Packs/day: 1.00    Years: 20.00    Pack years: 20.00    Types: Cigarettes    Last attempt to quit: 06/29/2006    Years since quitting: 11.3  . Smokeless tobacco: Never Used  Substance and Sexual Activity  . Alcohol use: No    Alcohol/week: 0.0 standard drinks    Comment: occ beer  . Drug use: No  . Sexual activity: Not on file  Lifestyle  . Physical activity:    Days per week: Not on  file    Minutes per session: Not on file  . Stress: Not on file  Relationships  . Social connections:    Talks on phone: Not on file    Gets together: Not on file    Attends religious service: Not on file    Active member of club or organization: Not on file    Attends meetings of clubs or organizations: Not on file    Relationship status: Not on file  . Intimate partner violence:    Fear of current or ex partner: Not on file    Emotionally abused: Not on file    Physically abused: Not on file    Forced sexual activity: Not on file  Other Topics Concern  . Not on file  Social History Narrative  . Not on file    Past Surgical History:  Procedure Laterality Date  . BREAST IMPLANT REMOVAL    . CESAREAN SECTION    . COLON SURGERY     had about 1 foot of bowel removed   . HEMORRHOID SURGERY    . LIPOMA EXCISION N/A 10/23/2014   Procedure: EXCISION OF BACK LIPOMA;  Surgeon: Donnie Mesa, MD;  Location: [facility];  Service: General;  Laterality: N/A;  . PARTIAL COLECTOMY    . PARTIAL HYSTERECTOMY     still has ovaries   . PILONIDAL CYST EXCISION    . PLACEMENT OF BREAST IMPLANTS    . RHINOPLASTY     x 2  . SHOULDER SURGERY Right    06    Family History  Problem Relation Age of Onset  . Melanoma Mother   . Kidney cancer Father   . Breast cancer Paternal Grandmother   . Cancer Maternal Grandmother        possible uterine?     Allergies  Allergen Reactions  . Ezetimibe     REACTION: statins make joints ache  . Latex Hives  . Morphine Sulfate     REACTION: n \\T \ v    Current Outpatient Medications on File Prior to Visit  Medication Sig Dispense Refill  . Alum Hydroxide-Mag Carbonate (GAVISCON PO) Take by mouth.    Marland Kitchen amphetamine-dextroamphetamine (ADDERALL XR) 20 MG 24 hr capsule TAKE 1 TO 2 CAPSULES BY MOUTH EVERY MORNING  0  . Cholecalciferol (VITAMIN D3) 5000 units CAPS Take 1 capsule by mouth daily.     . fenofibrate (TRICOR) 48 MG tablet Take 1 tablet (48 mg  total) by mouth daily. 90 tablet 0  . ibuprofen (ADVIL,MOTRIN) 200 MG tablet Take 400 mg by mouth every 6 (six) hours as needed.    . L-LYSINE PO Take by mouth. USING FOR MOUTH ULCERS    . Misc Natural Products (TART CHERRY ADVANCED) CAPS Take 2 capsules by mouth daily.    . Multiple Vitamins-Minerals (MULTIVITAMIN ADULTS PO) Take by mouth daily.    . predniSONE (  DELTASONE) 20 MG tablet Take two tabs x 3 days and then 1 until gone 10 tablet 0   No current facility-administered medications on file prior to visit.     BP 134/86 (BP Location: Left Arm, Patient Position: Sitting, Cuff Size: Normal)   Pulse 74   Temp 98.1 F (36.7 C) (Oral)   Ht 4' 11.15" (1.502 m)   Wt 138 lb (62.6 kg)   SpO2 98%   BMI 27.73 kg/m       Objective:   Physical Exam  Constitutional: She is oriented to person, place, and time. She appears well-developed and well-nourished. No distress.  HENT:  Head: Normocephalic and atraumatic.  Right Ear: External ear normal.  Left Ear: External ear normal.  Nose: Nose normal.  Mouth/Throat: Oropharynx is clear and moist. No oropharyngeal exudate.  Eyes: Pupils are equal, round, and reactive to light. Conjunctivae and EOM are normal. Right eye exhibits no discharge. Left eye exhibits no discharge. No scleral icterus.  Neck: Normal range of motion. Neck supple. No JVD present. No tracheal deviation present. No thyromegaly present.  Cardiovascular: Normal rate, regular rhythm, normal heart sounds and intact distal pulses. Exam reveals no gallop and no friction rub.  No murmur heard. Pulmonary/Chest: Effort normal and breath sounds normal. No stridor. No respiratory distress. She has no wheezes. She has no rales. She exhibits no tenderness.  Abdominal: Soft. Bowel sounds are normal. She exhibits no distension and no mass. There is no tenderness. There is no rebound and no guarding. No hernia.  Musculoskeletal: Normal range of motion. She exhibits no edema, tenderness or  deformity.  Lymphadenopathy:    She has no cervical adenopathy.  Neurological: She is alert and oriented to person, place, and time. She displays normal reflexes. No cranial nerve deficit or sensory deficit. She exhibits normal muscle tone. Coordination normal.  Skin: Skin is warm and dry. Capillary refill takes less than 2 seconds. No rash noted. She is not diaphoretic. No erythema. No pallor.  Psychiatric: She has a normal mood and affect. Her behavior is normal. Judgment and thought content normal.  Nursing note and vitals reviewed.     Assessment & Plan:  1. Routine general medical examination at a health care facility - Continue to work on diet and exercise - Follow up in one year or sooner if needed - Basic metabolic panel - CBC with Differential/Platelet - Hepatic function panel - Lipid panel - TSH  2. Attention deficit hyperactivity disorder (ADHD), combined type - Continue with POC by psychiatry    3. Essential hypertension  - Basic metabolic panel - CBC with Differential/Platelet - Hepatic function panel - Lipid panel - TSH  4. Pure hypercholesterolemia  - Basic metabolic panel - CBC with Differential/Platelet - Hepatic function panel - Lipid panel - TSH  5. Gout, unspecified cause, unspecified chronicity, unspecified site - Uric Acid - Will continue to monitor. Will not start allopurinol at this time   6. Need for hepatitis C screening test  - Hep C Antibody   Dorothyann Peng, NP

## 2017-10-28 ENCOUNTER — Other Ambulatory Visit: Payer: Self-pay | Admitting: Adult Health

## 2017-10-28 LAB — HEPATITIS C ANTIBODY
Hepatitis C Ab: NONREACTIVE
SIGNAL TO CUT-OFF: 0.02 (ref <1.00)

## 2017-10-28 MED ORDER — LEVOTHYROXINE SODIUM 25 MCG PO TABS: 25.0000 ug | ORAL_TABLET | Freq: Every day | ORAL | 1 refills | Status: DC

## 2017-10-28 NOTE — Telephone Encounter (Signed)
Sent to the pharmacy by e-scribe. 

## 2017-11-02 ENCOUNTER — Other Ambulatory Visit: Payer: Self-pay | Admitting: Adult Health

## 2017-11-02 DIAGNOSIS — Z1231 Encounter for screening mammogram for malignant neoplasm of breast: Secondary | ICD-10-CM

## 2017-11-03 ENCOUNTER — Other Ambulatory Visit: Payer: Self-pay | Admitting: Adult Health

## 2017-11-03 ENCOUNTER — Telehealth: Payer: Self-pay | Admitting: *Deleted

## 2017-11-03 DIAGNOSIS — M109 Gout, unspecified: Secondary | ICD-10-CM

## 2017-11-03 MED ORDER — PREDNISONE 20 MG PO TABS: ORAL_TABLET | ORAL | 0 refills | Status: DC

## 2017-11-03 NOTE — Telephone Encounter (Signed)
Prednisone called in   There has been association with hypothyroidism and gout but synthroid will not cause gout

## 2017-11-03 NOTE — Telephone Encounter (Signed)
FYI

## 2017-11-03 NOTE — Telephone Encounter (Signed)
Left message on machine for patient to return our call CRM 

## 2017-11-03 NOTE — Telephone Encounter (Signed)
Copied from [place] [id number]. Topic: General - Other >> Nov 03, 2017  8:16 AM Carolyn Stare wrote:  Pt said she is tolerating the thyroid medicine but has had a gout flair up and is asking if something to be called in. She is thinking that maybe the thyroid med may have cause her to flair up      [place]

## 2017-11-03 NOTE — Telephone Encounter (Signed)
Patient notified of provider response- she is very grateful and feels he listens to her and she sings his praises. Please let him know.

## 2017-11-07 DIAGNOSIS — M1811 Unilateral primary osteoarthritis of first carpometacarpal joint, right hand: Secondary | ICD-10-CM | POA: Diagnosis not present

## 2017-11-07 DIAGNOSIS — M79645 Pain in left finger(s): Secondary | ICD-10-CM | POA: Diagnosis not present

## 2017-11-07 DIAGNOSIS — M1812 Unilateral primary osteoarthritis of first carpometacarpal joint, left hand: Secondary | ICD-10-CM | POA: Diagnosis not present

## 2017-11-07 DIAGNOSIS — M79644 Pain in right finger(s): Secondary | ICD-10-CM | POA: Diagnosis not present

## 2017-11-09 DIAGNOSIS — L570 Actinic keratosis: Secondary | ICD-10-CM | POA: Diagnosis not present

## 2017-11-09 DIAGNOSIS — L821 Other seborrheic keratosis: Secondary | ICD-10-CM | POA: Diagnosis not present

## 2017-11-09 DIAGNOSIS — D229 Melanocytic nevi, unspecified: Secondary | ICD-10-CM | POA: Diagnosis not present

## 2017-11-19 ENCOUNTER — Other Ambulatory Visit: Payer: Self-pay | Admitting: Adult Health

## 2017-11-22 NOTE — Telephone Encounter (Signed)
Denied.  Filled on 10/28/17 for 2 months.  Request is early.

## 2017-11-24 ENCOUNTER — Ambulatory Visit
Admission: RE | Admit: 2017-11-24 | Discharge: 2017-11-24 | Disposition: A | Discharge status: Home / Self Care | Payer: PPO | Source: Ambulatory Visit | Attending: Adult Health | Admitting: Adult Health

## 2017-11-24 DIAGNOSIS — Z1231 Encounter for screening mammogram for malignant neoplasm of breast: Secondary | ICD-10-CM

## 2017-11-28 DIAGNOSIS — L570 Actinic keratosis: Secondary | ICD-10-CM | POA: Diagnosis not present

## 2017-12-14 ENCOUNTER — Ambulatory Visit: Payer: PPO | Admitting: Adult Health

## 2017-12-19 ENCOUNTER — Other Ambulatory Visit: Payer: Self-pay | Admitting: Adult Health

## 2017-12-20 NOTE — Telephone Encounter (Signed)
Should pt come in for retest TSH?

## 2017-12-21 ENCOUNTER — Other Ambulatory Visit: Payer: Self-pay | Admitting: Adult Health

## 2017-12-21 ENCOUNTER — Encounter: Payer: Self-pay | Admitting: Adult Health

## 2017-12-21 ENCOUNTER — Ambulatory Visit (INDEPENDENT_AMBULATORY_CARE_PROVIDER_SITE_OTHER): Payer: PPO | Admitting: Adult Health

## 2017-12-21 VITALS — BP 134/78 | Temp 98.4°F | Wt 135.0 lb

## 2017-12-21 DIAGNOSIS — E039 Hypothyroidism, unspecified: Secondary | ICD-10-CM | POA: Diagnosis not present

## 2017-12-21 LAB — TSH: TSH: 3.49 u[IU]/mL (ref 0.35–4.50)

## 2017-12-21 LAB — T4, FREE: Free T4: 0.84 ng/dL (ref 0.60–1.60)

## 2017-12-21 LAB — T3, FREE: T3, Free: 3.7 pg/mL (ref 2.3–4.2)

## 2017-12-21 MED ORDER — LEVOTHYROXINE SODIUM 50 MCG PO TABS: 50.0000 ug | ORAL_TABLET | Freq: Every day | ORAL | 1 refills | Status: DC

## 2017-12-21 NOTE — Progress Notes (Signed)
Subjective:    Patient ID: Diana [name], female    DOB: [birth year], 67 y.o.   MRN: [id number]  HPI 67 year old female who  has a past medical history of ADD (attention deficit disorder), Anxiety, Cancer ([place]), Depression, Diverticulosis, DJD (degenerative joint disease), GERD (gastroesophageal reflux disease), History of hiatal hernia, Hypertension, and Mood disorder ([place]).  She presents to the office today for follow up regarding hypothyroidism. During her last visit her TSH was slightly abnormal and the decision was made to start her on a low-dose of Synthroid due to symptoms of fatigue, hair loss, recurrent gout flares, and generalized "not feeling herself".  He was placed on Synthroid 25 mcg every morning.  In the office she reports that she is started to feel well improved, he feels as though her energy level has increased, depression has been doing better, she has not had any recurrent gout flares, and although she is continuing to lose hair it is not as she has it was prior to starting Synthroid   Review of Systems See HPI   Past Medical History:  Diagnosis Date  . ADD (attention deficit disorder)   . Anxiety   . Cancer ([place])    skin  . Depression   . Diverticulosis   . DJD (degenerative joint disease)   . GERD (gastroesophageal reflux disease)   . History of hiatal hernia   . Hypertension   . Mood disorder [facility])     Social History   Socioeconomic History  . Marital status: Married    Spouse name: Not on file  . Number of children: 1  . Years of education: Not on file  . Highest education level: Not on file  Occupational History  . Not on file  Social Needs  . Financial resource strain: Not on file  . Food insecurity:    Worry: Not on file    Inability: Not on file  . Transportation needs:    Medical: Not on file    Non-medical: Not on file  Tobacco Use  . Smoking status: Former Smoker    Packs/day: 1.00    Years: 20.00    Pack years: 20.00    Types:  Cigarettes    Last attempt to quit: 06/29/2006    Years since quitting: 11.4  . Smokeless tobacco: Never Used  Substance and Sexual Activity  . Alcohol use: No    Alcohol/week: 0.0 standard drinks    Comment: occ beer  . Drug use: No  . Sexual activity: Not on file  Lifestyle  . Physical activity:    Days per week: Not on file    Minutes per session: Not on file  . Stress: Not on file  Relationships  . Social connections:    Talks on phone: Not on file    Gets together: Not on file    Attends religious service: Not on file    Active member of club or organization: Not on file    Attends meetings of clubs or organizations: Not on file    Relationship status: Not on file  . Intimate partner violence:    Fear of current or ex partner: Not on file    Emotionally abused: Not on file    Physically abused: Not on file    Forced sexual activity: Not on file  Other Topics Concern  . Not on file  Social History Narrative  . Not on file    Past Surgical History:  Procedure Laterality Date  .  BREAST IMPLANT REMOVAL    . CESAREAN SECTION    . COLON SURGERY     had about 1 foot of bowel removed   . HEMORRHOID SURGERY    . LIPOMA EXCISION N/A 10/23/2014   Procedure: EXCISION OF BACK LIPOMA;  Surgeon: Donnie Mesa, MD;  Location: [place];  Service: General;  Laterality: N/A;  . PARTIAL COLECTOMY    . PARTIAL HYSTERECTOMY     still has ovaries   . PILONIDAL CYST EXCISION    . PLACEMENT OF BREAST IMPLANTS    . RHINOPLASTY     x 2  . SHOULDER SURGERY Right    06    Family History  Problem Relation Age of Onset  . Melanoma Mother   . Kidney cancer Father   . Breast cancer Paternal Grandmother   . Cancer Maternal Grandmother        possible uterine?     Allergies  Allergen Reactions  . Ezetimibe     REACTION: statins make joints ache  . Latex Hives  . Morphine Sulfate     REACTION: n \\T \ v    Current Outpatient Medications on File Prior to Visit  Medication Sig  Dispense Refill  . Alum Hydroxide-Mag Carbonate (GAVISCON PO) Take by mouth.    Marland Kitchen amphetamine-dextroamphetamine (ADDERALL XR) 20 MG 24 hr capsule TAKE 1 TO 2 CAPSULES BY MOUTH EVERY MORNING  0  . Cholecalciferol (VITAMIN D3) 5000 units CAPS Take 1 capsule by mouth daily.     . fenofibrate (TRICOR) 48 MG tablet TAKE 1 TABLET (48 MG TOTAL) BY MOUTH DAILY. 90 tablet 3  . ibuprofen (ADVIL,MOTRIN) 200 MG tablet Take 400 mg by mouth every 6 (six) hours as needed.    Marland Kitchen levothyroxine (SYNTHROID) 25 MCG tablet Take 1 tablet (25 mcg total) by mouth daily before breakfast. 30 tablet 1  . Misc Natural Products (TART CHERRY ADVANCED) CAPS Take 2 capsules by mouth daily.    . Multiple Vitamins-Minerals (MULTIVITAMIN ADULTS PO) Take by mouth daily.     No current facility-administered medications on file prior to visit.     BP 134/78   Temp 98.4 F (36.9 C)   Wt 135 lb (61.2 kg)   BMI 27.12 kg/m       Objective:   Physical Exam  Constitutional: She is oriented to person, place, and time. She appears well-developed and well-nourished. No distress.  Eyes: Pupils are equal, round, and reactive to light. Conjunctivae and EOM are normal. Right eye exhibits no discharge. Left eye exhibits no discharge. No scleral icterus.  Neck: Normal range of motion. Neck supple. No JVD present. No tracheal deviation present. No thyromegaly present.  Cardiovascular: Normal rate, regular rhythm, normal heart sounds and intact distal pulses.  Pulmonary/Chest: Effort normal and breath sounds normal.  Lymphadenopathy:    She has no cervical adenopathy.  Neurological: She is alert and oriented to person, place, and time.  Skin: Skin is warm and dry. She is not diaphoretic.  Psychiatric: She has a normal mood and affect. Her behavior is normal. Judgment and thought content normal.  Nursing note and vitals reviewed.     Assessment & Plan:  1. Acquired hypothyroidism -Consider increasing Synthroid. - TSH - T3, Free -  T4, Free  Dorothyann Peng, NP

## 2017-12-21 NOTE — Telephone Encounter (Signed)
Spoke to patient and informed of thyroid studies. Will increase Synthroid to 50 mcg. She will follow up in one month for reevaluation

## 2018-01-13 ENCOUNTER — Other Ambulatory Visit: Payer: Self-pay | Admitting: Adult Health

## 2018-01-19 DIAGNOSIS — F900 Attention-deficit hyperactivity disorder, predominantly inattentive type: Secondary | ICD-10-CM | POA: Diagnosis not present

## 2018-01-24 ENCOUNTER — Other Ambulatory Visit: Payer: Self-pay | Admitting: Adult Health

## 2018-01-24 ENCOUNTER — Encounter: Payer: Self-pay | Admitting: Adult Health

## 2018-01-24 ENCOUNTER — Ambulatory Visit (INDEPENDENT_AMBULATORY_CARE_PROVIDER_SITE_OTHER): Payer: PPO | Admitting: Adult Health

## 2018-01-24 VITALS — BP 130/82 | Temp 98.4°F | Wt 138.0 lb

## 2018-01-24 DIAGNOSIS — E039 Hypothyroidism, unspecified: Secondary | ICD-10-CM | POA: Diagnosis not present

## 2018-01-24 LAB — TSH: TSH: 2.56 u[IU]/mL (ref 0.35–4.50)

## 2018-01-24 LAB — T3, FREE: T3, Free: 3.5 pg/mL (ref 2.3–4.2)

## 2018-01-24 MED ORDER — LEVOTHYROXINE SODIUM 75 MCG PO TABS: 75.0000 ug | ORAL_TABLET | Freq: Every day | ORAL | 0 refills | Status: DC

## 2018-01-24 NOTE — Progress Notes (Signed)
Subjective:    Patient ID: Diana [name], female    DOB: [birth year], 68 y.o.   MRN: [id number]  HPI  68 year old female who  has a past medical history of ADD (attention deficit disorder), Anxiety, Cancer ([place]), Depression, Diverticulosis, DJD (degenerative joint disease), GERD (gastroesophageal reflux disease), History of hiatal hernia, Hypertension, and Mood disorder ([place]).  She presents to the office today for follow up regarding hypothyroidism. She was started on synthroid 25 mcg in October for symptomatic hypothyroidism. She reported improvement in symptoms such as fatigue, hair loss and depression. Her repeat TSH was 3+ and the decision was made to increase synthroid slightly to 50 mcg.   Today in the office she reports that she is doing well. Her energy increased after the increase in synthroid. She also reports that although her hair is still falling out, she feels as though this is improving as well. Her biggest complaint is that of continued dry skin   Review of Systems See HPI   Past Medical History:  Diagnosis Date  . ADD (attention deficit disorder)   . Anxiety   . Cancer ([place])    skin  . Depression   . Diverticulosis   . DJD (degenerative joint disease)   . GERD (gastroesophageal reflux disease)   . History of hiatal hernia   . Hypertension   . Mood disorder [facility])     Social History   Socioeconomic History  . Marital status: Married    Spouse name: Not on file  . Number of children: 1  . Years of education: Not on file  . Highest education level: Not on file  Occupational History  . Not on file  Social Needs  . Financial resource strain: Not on file  . Food insecurity:    Worry: Not on file    Inability: Not on file  . Transportation needs:    Medical: Not on file    Non-medical: Not on file  Tobacco Use  . Smoking status: Former Smoker    Packs/day: 1.00    Years: 20.00    Pack years: 20.00    Types: Cigarettes    Last attempt to quit:  06/29/2006    Years since quitting: 11.5  . Smokeless tobacco: Never Used  Substance and Sexual Activity  . Alcohol use: No    Alcohol/week: 0.0 standard drinks    Comment: occ beer  . Drug use: No  . Sexual activity: Not on file  Lifestyle  . Physical activity:    Days per week: Not on file    Minutes per session: Not on file  . Stress: Not on file  Relationships  . Social connections:    Talks on phone: Not on file    Gets together: Not on file    Attends religious service: Not on file    Active member of club or organization: Not on file    Attends meetings of clubs or organizations: Not on file    Relationship status: Not on file  . Intimate partner violence:    Fear of current or ex partner: Not on file    Emotionally abused: Not on file    Physically abused: Not on file    Forced sexual activity: Not on file  Other Topics Concern  . Not on file  Social History Narrative  . Not on file    Past Surgical History:  Procedure Laterality Date  . BREAST IMPLANT REMOVAL    . CESAREAN SECTION    .  COLON SURGERY     had about 1 foot of bowel removed   . HEMORRHOID SURGERY    . LIPOMA EXCISION N/A 10/23/2014   Procedure: EXCISION OF BACK LIPOMA;  Surgeon: Donnie Mesa, MD;  Location: [place];  Service: General;  Laterality: N/A;  . PARTIAL COLECTOMY    . PARTIAL HYSTERECTOMY     still has ovaries   . PILONIDAL CYST EXCISION    . PLACEMENT OF BREAST IMPLANTS    . RHINOPLASTY     x 2  . SHOULDER SURGERY Right    06    Family History  Problem Relation Age of Onset  . Melanoma Mother   . Kidney cancer Father   . Breast cancer Paternal Grandmother   . Cancer Maternal Grandmother        possible uterine?     Allergies  Allergen Reactions  . Ezetimibe     REACTION: statins make joints ache  . Latex Hives  . Morphine Sulfate     REACTION: n \\T \ v    Current Outpatient Medications on File Prior to Visit  Medication Sig Dispense Refill  . Alum Hydroxide-Mag  Carbonate (GAVISCON PO) Take by mouth.    Marland Kitchen amphetamine-dextroamphetamine (ADDERALL XR) 20 MG 24 hr capsule TAKE 1 TO 2 CAPSULES BY MOUTH EVERY MORNING  0  . Cholecalciferol (VITAMIN D3) 5000 units CAPS Take 1 capsule by mouth daily.     . fenofibrate (TRICOR) 48 MG tablet TAKE 1 TABLET (48 MG TOTAL) BY MOUTH DAILY. 90 tablet 3  . ibuprofen (ADVIL,MOTRIN) 200 MG tablet Take 400 mg by mouth every 6 (six) hours as needed.    Marland Kitchen levothyroxine (SYNTHROID, LEVOTHROID) 50 MCG tablet TAKE 1 TABLET BY MOUTH DAILY BEFORE BREAKFAST 30 tablet 5  . Misc Natural Products (TART CHERRY ADVANCED) CAPS Take 2 capsules by mouth daily.    . Multiple Vitamins-Minerals (MULTIVITAMIN ADULTS PO) Take by mouth daily.     No current facility-administered medications on file prior to visit.     BP 130/82   Temp 98.4 F (36.9 C)   Wt 138 lb (62.6 kg)   BMI 27.73 kg/m       Objective:   Physical Exam Vitals signs and nursing note reviewed.  Constitutional:      Appearance: Normal appearance.  Cardiovascular:     Rate and Rhythm: Normal rate and regular rhythm.     Pulses: Normal pulses.     Heart sounds: Normal heart sounds.  Pulmonary:     Effort: Pulmonary effort is normal.     Breath sounds: Normal breath sounds.  Skin:    General: Skin is warm and dry.  Neurological:     General: No focal deficit present.     Mental Status: She is alert and oriented to person, place, and time.  Psychiatric:        Mood and Affect: Mood normal.        Behavior: Behavior normal.        Thought Content: Thought content normal.        Judgment: Judgment normal.       Assessment & Plan:  1. Hypothyroidism, unspecified type - Consider increasing synthroid  - TSH - T3, Free  Dorothyann Peng, NP

## 2018-02-07 ENCOUNTER — Ambulatory Visit: Payer: Self-pay | Admitting: *Deleted

## 2018-02-07 NOTE — Telephone Encounter (Signed)
Message from Rayann Heman sent at 02/07/2018 7:47 AM EST   Summary: Gout flare up    Pt called and states that she is having a gout flare up today. Pt would like to know if she can get something called in.          Returned call to patient regarding gout pain. Pt is requesting prednisone to be called in. This has been called in before and it really helped. She has the redness, warmth, and swelling of her left foot, base of the big toe.  The pain started a couple of days ago and has gradually gotten worst.  She has not taken any pain med yet. She had had steak a couple of nights ago and thinks this is what caused her to have the flare up. She is asking for his provider to give her a call back this morning. She has a dental appointment this afternoon at 3. Will route to [facility] at [place].  Reason for Disposition . Pain in the big toe joint  Answer Assessment - Initial Assessment Questions 1. ONSET: "When did the pain start?"      Last couple of days 2. LOCATION: "Where is the pain located?"      Left foot base of big toe 3. PAIN: "How bad is the pain?"    (Scale 1-10; or mild, moderate, severe)   -  MILD (1-3): doesn't interfere with normal activities    -  MODERATE (4-7): interferes with normal activities (e.g., work or school) or awakens from sleep, limping    -  SEVERE (8-10): excruciating pain, unable to do any normal activities, unable to walk     Pain # 7 4. WORK OR EXERCISE: "Has there been any recent work or exercise that involved this part of the body?"      Nothing new, walking out side about 45 mins to an hour 5. CAUSE: "What do you think is causing the foot pain?"     gout 6. OTHER SYMPTOMS: "Do you have any other symptoms?" (e.g., leg pain, rash, fever, numbness)    Redness, swelling, warmth 7. PREGNANCY: "Is there any chance you are pregnant?" "When was your last menstrual period?"     n/a  Protocols used: FOOT PAIN-A-AH

## 2018-02-07 NOTE — Telephone Encounter (Signed)
Spoke to the pt and informed her that Diana [name] is out of the office and will return on Thursday.  I scheduled her to see Dr. [place] for her gout flare.  She is requesting prednisone.  Advised she call back and speak to myself or the nurse line if needed.

## 2018-02-08 ENCOUNTER — Ambulatory Visit (INDEPENDENT_AMBULATORY_CARE_PROVIDER_SITE_OTHER): Payer: PPO | Admitting: Family Medicine

## 2018-02-08 ENCOUNTER — Encounter: Payer: Self-pay | Admitting: Family Medicine

## 2018-02-08 VITALS — BP 124/80 | HR 60 | Temp 98.4°F | Resp 12 | Ht 59.15 in | Wt 139.4 lb

## 2018-02-08 DIAGNOSIS — M109 Gout, unspecified: Secondary | ICD-10-CM

## 2018-02-08 MED ORDER — COLCHICINE 0.6 MG PO TABS: 0.6000 mg | ORAL_TABLET | Freq: Every day | ORAL | 0 refills | Status: DC | PRN

## 2018-02-08 MED ORDER — PREDNISONE 20 MG PO TABS: 40.0000 mg | ORAL_TABLET | Freq: Every day | ORAL | 0 refills | Status: AC

## 2018-02-08 NOTE — Patient Instructions (Signed)
A few things to remember from today's visit:   Acute gout involving toe of left foot, unspecified cause     Low-Purine Diet Purines are compounds that affect the level of uric acid in your body. A low-purine diet is a diet that is low in purines. Eating a low-purine diet can prevent the level of uric acid in your body from getting too high and causing gout or kidney stones or both. WHAT DO I NEED TO KNOW ABOUT THIS DIET?  Choose low-purine foods. Examples of low-purine foods are listed in the next section.  Drink plenty of fluids, especially water. Fluids can help remove uric acid from your body. Try to drink 8-16 cups (1.9-3.8 L) a day.  Limit foods high in fat, especially saturated fat, as fat makes it harder for the body to get rid of uric acid. Foods high in saturated fat include pizza, cheese, ice cream, whole milk, fried foods, and gravies. Choose foods that are lower in fat and lean sources of protein. Use olive oil when cooking as it contains healthy fats that are not high in saturated fat.  Limit alcohol. Alcohol interferes with the elimination of uric acid from your body. If you are having a gout attack, avoid all alcohol.  Keep in mind that different people's bodies react differently to different foods. You will probably learn over time which foods do or do not affect you. If you discover that a food tends to cause your gout to flare up, avoid eating that food. You can more freely enjoy foods that do not cause problems. If you have any questions about a food item, talk to your dietitian or health care provider. WHICH FOODS ARE LOW, MODERATE, AND HIGH IN PURINES? The following is a list of foods that are low, moderate, and high in purines. You can eat any amount of the foods that are low in purines. You may be able to have small amounts of foods that are moderate in purines. Ask your health care provider how much of a food moderate in purines you can have. Avoid foods high in  purines. Grains  Foods low in purines: Enriched white bread, pasta, rice, cake, cornbread, popcorn.  Foods moderate in purines: Whole-grain breads and cereals, wheat germ, bran, oatmeal. Uncooked oatmeal. Dry wheat bran or wheat germ.  Foods high in purines: Pancakes, [place] toast, biscuits, muffins. Vegetables  Foods low in purines: All vegetables, except those that are moderate in purines.  Foods moderate in purines: Asparagus, cauliflower, spinach, mushrooms, green peas. Fruits  All fruits are low in purines. Meats and other Protein Foods  Foods low in purines: Eggs, nuts, peanut butter.  Foods moderate in purines: 80-90% lean beef, lamb, veal, pork, poultry, fish, eggs, peanut butter, nuts. Crab, lobster, oysters, and shrimp. Cooked dried beans, peas, and lentils.  Foods high in purines: Anchovies, sardines, herring, mussels, tuna, codfish, scallops, trout, and haddock. Berniece Salines. Organ meats (such as liver or kidney). Tripe. Game meat. Goose. Sweetbreads. Dairy  All dairy foods are low in purines. Low-fat and fat-free dairy products are best because they are low in saturated fat. Beverages  Drinks low in purines: Water, carbonated beverages, tea, coffee, cocoa.  Drinks moderate in purines: Soft drinks and other drinks sweetened with high-fructose corn syrup. Juices. To find whether a food or drink is sweetened with high-fructose corn syrup, look at the ingredients list.  Drinks high in purines: Alcoholic beverages (such as beer). Condiments  Foods low in purines: Salt, herbs, olives, pickles,  relishes, vinegar.  Foods moderate in purines: Butter, margarine, oils, mayonnaise. Fats and Oils  Foods low in purines: All types, except gravies and sauces made with meat.  Foods high in purines: Gravies and sauces made with meat. Other Foods  Foods low in purines: Sugars, sweets, gelatin. Cake. Soups made without meat.  Foods moderate in purines: Meat-based or fish-based soups,  broths, or bouillons. Foods and drinks sweetened with high-fructose corn syrup.  Foods high in purines: High-fat desserts (such as ice cream, cookies, cakes, pies, doughnuts, and chocolate). Contact your dietitian for more information on foods that are not listed here.   This information is not intended to replace advice given to you by your health care provider. Make sure you discuss any questions you have with your health care provider.   Document Released: 05/01/2010 Document Revised: 01/09/2013 Document Reviewed: 12/11/2012 Elsevier Interactive Patient Education [employer].

## 2018-02-08 NOTE — Progress Notes (Signed)
ACUTE VISIT   HPI:  Chief Complaint  Patient presents with  . Gout Flare-up    left great toe painful and red    Diana [name] is a 68 y.o. female, who is here today complaining of 1 to 2 days of severe left great toe pain, edema, and erythema. Pain is better today. History of gout, she is reporting about 3 episodes in the past 6 months. She states that gout has improved after starting levothyroxine for hypothyroidism, she used to have episodes every 6 weeks.  Pain is exacerbated by light touch, walking, and when wearing shoe wear. Alleviated by elevation.  She has not identified gout exacerbating factors. No history of trauma. She has been walking more frequent for exercise.  She states that 2-3 days ago she ate a steak, she has not eaten steak in about a year. She denies alcohol intake.  History of OA, pending surgical procedure of left first MCP joint. She has not noted fever, chills, unusual fatigue.   She did take ibuprofen 400 mg yesterday, which seemed to help.   Review of Systems  Constitutional: Negative for chills and fever.  Musculoskeletal: Positive for arthralgias and joint swelling.  Skin: Negative for rash and wound.  Neurological: Negative for weakness and numbness.      Current Outpatient Medications on File Prior to Visit  Medication Sig Dispense Refill  . Alum Hydroxide-Mag Carbonate (GAVISCON PO) Take by mouth.    Marland Kitchen amphetamine-dextroamphetamine (ADDERALL XR) 20 MG 24 hr capsule TAKE 1 TO 2 CAPSULES BY MOUTH EVERY MORNING  0  . Cholecalciferol (VITAMIN D3) 5000 units CAPS Take 1 capsule by mouth daily.     . fenofibrate (TRICOR) 48 MG tablet TAKE 1 TABLET (48 MG TOTAL) BY MOUTH DAILY. 90 tablet 3  . ibuprofen (ADVIL,MOTRIN) 200 MG tablet Take 400 mg by mouth every 6 (six) hours as needed.    Marland Kitchen levothyroxine (SYNTHROID, LEVOTHROID) 75 MCG tablet Take 1 tablet (75 mcg total) by mouth daily before breakfast. 30 tablet 0  . Misc  Natural Products (TART CHERRY ADVANCED) CAPS Take 2 capsules by mouth daily.    . Multiple Vitamins-Minerals (MULTIVITAMIN ADULTS PO) Take by mouth daily.     No current facility-administered medications on file prior to visit.      Past Medical History:  Diagnosis Date  . ADD (attention deficit disorder)   . Anxiety   . Cancer ([place])    skin  . Depression   . Diverticulosis   . DJD (degenerative joint disease)   . GERD (gastroesophageal reflux disease)   . History of hiatal hernia   . Hypertension   . Mood disorder ([facility])    Allergies  Allergen Reactions  . Ezetimibe     REACTION: statins make joints ache  . Latex Hives  . Morphine Sulfate     REACTION: n \\T \ v    Social History   Socioeconomic History  . Marital status: Married    Spouse name: Not on file  . Number of children: 1  . Years of education: Not on file  . Highest education level: Not on file  Occupational History  . Not on file  Social Needs  . Financial resource strain: Not on file  . Food insecurity:    Worry: Not on file    Inability: Not on file  . Transportation needs:    Medical: Not on file    Non-medical: Not on file  Tobacco Use  .  Smoking status: Former Smoker    Packs/day: 1.00    Years: 20.00    Pack years: 20.00    Types: Cigarettes    Last attempt to quit: 06/29/2006    Years since quitting: 11.6  . Smokeless tobacco: Never Used  Substance and Sexual Activity  . Alcohol use: No    Alcohol/week: 0.0 standard drinks    Comment: occ beer  . Drug use: No  . Sexual activity: Not on file  Lifestyle  . Physical activity:    Days per week: Not on file    Minutes per session: Not on file  . Stress: Not on file  Relationships  . Social connections:    Talks on phone: Not on file    Gets together: Not on file    Attends religious service: Not on file    Active member of club or organization: Not on file    Attends meetings of clubs or organizations: Not on file    Relationship  status: Not on file  Other Topics Concern  . Not on file  Social History Narrative  . Not on file    Vitals:   02/08/18 1053  BP: 124/80  Pulse: 60  Resp: 12  Temp: 98.4 F (36.9 C)  SpO2: 96%   Body mass index is 28.01 kg/m.    Physical Exam  Nursing note and vitals reviewed. Constitutional: She is oriented to person, place, and time. She appears well-developed and well-nourished. She does not appear ill. No distress.  HENT:  Head: Normocephalic and atraumatic.  Eyes: Conjunctivae are normal.  Cardiovascular: Normal rate and regular rhythm.  Pulses:      Dorsalis pedis pulses are 2+ on the left side.       Posterior tibial pulses are 2+ on the left side.  Respiratory: Effort normal and breath sounds normal. No respiratory distress.  Musculoskeletal:        General: No edema.     Left foot: Normal range of motion and normal capillary refill. Tenderness and swelling present. No deformity.       Feet:     Comments: 1st MTP joint edema and mild erythema. No limitation of movement. Tenderness with palpation.  Neurological: She is alert and oriented to person, place, and time. She has normal strength.  Skin: Skin is warm. No ecchymosis and no rash noted. There is erythema.  Psychiatric: Her mood appears anxious.  Well groomed, good eye contact.      ASSESSMENT AND PLAN:   [name] was seen today for gout flare-up.  Diagnoses and all orders for this visit:  Acute gout involving toe of left foot, unspecified cause -     predniSONE (DELTASONE) 20 MG tablet; Take 2 tablets (40 mg total) by mouth daily with breakfast for 3 days. -     colchicine 0.6 MG tablet; Take 1 tablet (0.6 mg total) by mouth daily as needed.  We discussed Dx and possible trigger factors. In the past she has tolerated prednisone well, we discussed side effects.  Recommend 3 days course of prednisone. Colchicine 0.6 mg 2 tablets as needed upon acute onset and can continue 1 daily for 5 to 7  days.  Discussed side effects. Low purine diet recommended. Follow-up with PCP as needed.    Return if symptoms worsen or fail to improve.       Stonewall Doss G. [place], MD  [facility]. [place] office.

## 2018-02-13 DIAGNOSIS — L738 Other specified follicular disorders: Secondary | ICD-10-CM | POA: Diagnosis not present

## 2018-02-13 DIAGNOSIS — L570 Actinic keratosis: Secondary | ICD-10-CM | POA: Diagnosis not present

## 2018-02-16 ENCOUNTER — Other Ambulatory Visit: Payer: Self-pay | Admitting: Adult Health

## 2018-02-17 NOTE — Telephone Encounter (Signed)
Sent to the pharmacy by e-scribe. 

## 2018-02-21 ENCOUNTER — Encounter: Payer: Self-pay | Admitting: Adult Health

## 2018-02-21 ENCOUNTER — Telehealth: Payer: Self-pay | Admitting: Adult Health

## 2018-02-21 ENCOUNTER — Ambulatory Visit (INDEPENDENT_AMBULATORY_CARE_PROVIDER_SITE_OTHER): Payer: PPO | Admitting: Adult Health

## 2018-02-21 VITALS — BP 138/88 | Temp 98.5°F | Wt 137.0 lb

## 2018-02-21 DIAGNOSIS — G479 Sleep disorder, unspecified: Secondary | ICD-10-CM | POA: Diagnosis not present

## 2018-02-21 DIAGNOSIS — E039 Hypothyroidism, unspecified: Secondary | ICD-10-CM | POA: Diagnosis not present

## 2018-02-21 DIAGNOSIS — R51 Headache: Secondary | ICD-10-CM

## 2018-02-21 LAB — T3, FREE: T3, Free: 4.2 pg/mL (ref 2.3–4.2)

## 2018-02-21 LAB — TSH: TSH: 1.02 u[IU]/mL (ref 0.35–4.50)

## 2018-02-21 LAB — T4, FREE: Free T4: 1.11 ng/dL (ref 0.60–1.60)

## 2018-02-21 MED ORDER — SYNTHROID 50 MCG PO TABS: 50.0000 ug | ORAL_TABLET | Freq: Every day | ORAL | 3 refills | Status: DC

## 2018-02-21 NOTE — Progress Notes (Signed)
Subjective:    Patient ID: Diana [name], female    DOB: [birth year], 68 y.o.   MRN: [id number]  HPI  68 year old female who  has a past medical history of ADD (attention deficit disorder), Anxiety, Cancer ([place]), Depression, Diverticulosis, DJD (degenerative joint disease), GERD (gastroesophageal reflux disease), History of hiatal hernia, Hypertension, and Mood disorder ([place]).  She presets to the office today for one month follow up. During her last visit synthroid was increase to 75 mcg from 50 mcg. She reports that since increasing synthroid she has been experiencing headaches, hot flashes, and trouble sleeping. She denies CP or SOB    Review of Systems See HPI   Past Medical History:  Diagnosis Date  . ADD (attention deficit disorder)   . Anxiety   . Cancer ([place])    skin  . Depression   . Diverticulosis   . DJD (degenerative joint disease)   . GERD (gastroesophageal reflux disease)   . History of hiatal hernia   . Hypertension   . Mood disorder [facility])     Social History   Socioeconomic History  . Marital status: Married    Spouse name: Not on file  . Number of children: 1  . Years of education: Not on file  . Highest education level: Not on file  Occupational History  . Not on file  Social Needs  . Financial resource strain: Not on file  . Food insecurity:    Worry: Not on file    Inability: Not on file  . Transportation needs:    Medical: Not on file    Non-medical: Not on file  Tobacco Use  . Smoking status: Former Smoker    Packs/day: 1.00    Years: 20.00    Pack years: 20.00    Types: Cigarettes    Last attempt to quit: 06/29/2006    Years since quitting: 11.6  . Smokeless tobacco: Never Used  Substance and Sexual Activity  . Alcohol use: No    Alcohol/week: 0.0 standard drinks    Comment: occ beer  . Drug use: No  . Sexual activity: Not on file  Lifestyle  . Physical activity:    Days per week: Not on file    Minutes per session: Not on file    . Stress: Not on file  Relationships  . Social connections:    Talks on phone: Not on file    Gets together: Not on file    Attends religious service: Not on file    Active member of club or organization: Not on file    Attends meetings of clubs or organizations: Not on file    Relationship status: Not on file  . Intimate partner violence:    Fear of current or ex partner: Not on file    Emotionally abused: Not on file    Physically abused: Not on file    Forced sexual activity: Not on file  Other Topics Concern  . Not on file  Social History Narrative  . Not on file    Past Surgical History:  Procedure Laterality Date  . BREAST IMPLANT REMOVAL    . CESAREAN SECTION    . COLON SURGERY     had about 1 foot of bowel removed   . HEMORRHOID SURGERY    . LIPOMA EXCISION N/A 10/23/2014   Procedure: EXCISION OF BACK LIPOMA;  Surgeon: Donnie Mesa, MD;  Location: [place];  Service: General;  Laterality: N/A;  . PARTIAL  COLECTOMY    . PARTIAL HYSTERECTOMY     still has ovaries   . PILONIDAL CYST EXCISION    . PLACEMENT OF BREAST IMPLANTS    . RHINOPLASTY     x 2  . SHOULDER SURGERY Right    06    Family History  Problem Relation Age of Onset  . Melanoma Mother   . Kidney cancer Father   . Breast cancer Paternal Grandmother   . Cancer Maternal Grandmother        possible uterine?     Allergies  Allergen Reactions  . Ezetimibe     REACTION: statins make joints ache  . Latex Hives  . Morphine Sulfate     REACTION: n \\T \ v    Current Outpatient Medications on File Prior to Visit  Medication Sig Dispense Refill  . Alum Hydroxide-Mag Carbonate (GAVISCON PO) Take by mouth.    Marland Kitchen amphetamine-dextroamphetamine (ADDERALL XR) 20 MG 24 hr capsule TAKE 1 TO 2 CAPSULES BY MOUTH EVERY MORNING  0  . Cholecalciferol (VITAMIN D3) 5000 units CAPS Take 1 capsule by mouth daily.     . colchicine 0.6 MG tablet Take 1 tablet (0.6 mg total) by mouth daily as needed. 30 tablet 0  .  fenofibrate (TRICOR) 48 MG tablet TAKE 1 TABLET (48 MG TOTAL) BY MOUTH DAILY. 90 tablet 3  . ibuprofen (ADVIL,MOTRIN) 200 MG tablet Take 400 mg by mouth every 6 (six) hours as needed.    . Misc Natural Products (TART CHERRY ADVANCED) CAPS Take 2 capsules by mouth daily.    . Multiple Vitamins-Minerals (MULTIVITAMIN ADULTS PO) Take by mouth daily.    . [DISCONTINUED] levothyroxine (SYNTHROID, LEVOTHROID) 75 MCG tablet TAKE 1 TABLET (75 MCG TOTAL) BY MOUTH DAILY BEFORE BREAKFAST. 30 tablet 0   No current facility-administered medications on file prior to visit.     BP 138/88   Temp 98.5 F (36.9 C)   Wt 137 lb (62.1 kg)   BMI 27.53 kg/m       Objective:   Physical Exam Vitals signs and nursing note reviewed.  Constitutional:      Appearance: Normal appearance.  Cardiovascular:     Rate and Rhythm: Normal rate and regular rhythm.     Pulses: Normal pulses.     Heart sounds: Normal heart sounds.  Pulmonary:     Effort: Pulmonary effort is normal.     Breath sounds: Normal breath sounds.  Musculoskeletal: Normal range of motion.  Skin:    General: Skin is warm and dry.  Neurological:     General: No focal deficit present.     Mental Status: She is alert. Mental status is at baseline.  Psychiatric:        Mood and Affect: Mood normal.        Behavior: Behavior normal.        Thought Content: Thought content normal.        Judgment: Judgment normal.       Assessment & Plan:  1. Hypothyroidism, unspecified type - Will check thyroid levels and likely decrease to Synthroid 50 mcg  - TSH - T3, Free - T4, Free   Dorothyann Peng, NP

## 2018-02-21 NOTE — Telephone Encounter (Signed)
Updated patient on labs   Will place back on Synthroid 50 mcg

## 2018-03-02 ENCOUNTER — Other Ambulatory Visit: Payer: Self-pay | Admitting: Family Medicine

## 2018-03-02 DIAGNOSIS — M109 Gout, unspecified: Secondary | ICD-10-CM

## 2018-03-29 ENCOUNTER — Other Ambulatory Visit: Payer: Self-pay | Admitting: Family Medicine

## 2018-03-29 DIAGNOSIS — M109 Gout, unspecified: Secondary | ICD-10-CM

## 2018-03-31 NOTE — Telephone Encounter (Signed)
Refuse  Should not need it any longer

## 2018-04-13 ENCOUNTER — Encounter: Payer: Self-pay | Admitting: *Deleted

## 2018-04-19 DIAGNOSIS — Z01818 Encounter for other preprocedural examination: Secondary | ICD-10-CM | POA: Diagnosis not present

## 2018-04-19 DIAGNOSIS — F900 Attention-deficit hyperactivity disorder, predominantly inattentive type: Secondary | ICD-10-CM | POA: Diagnosis not present

## 2018-04-19 DIAGNOSIS — Z20828 Contact with and (suspected) exposure to other viral communicable diseases: Secondary | ICD-10-CM | POA: Diagnosis not present

## 2018-05-06 ENCOUNTER — Other Ambulatory Visit: Payer: Self-pay | Admitting: Family Medicine

## 2018-05-06 DIAGNOSIS — M109 Gout, unspecified: Secondary | ICD-10-CM

## 2018-06-02 ENCOUNTER — Other Ambulatory Visit: Payer: Self-pay | Admitting: Family Medicine

## 2018-06-02 DIAGNOSIS — M109 Gout, unspecified: Secondary | ICD-10-CM

## 2018-06-29 ENCOUNTER — Other Ambulatory Visit: Payer: Self-pay | Admitting: Family Medicine

## 2018-06-29 DIAGNOSIS — M109 Gout, unspecified: Secondary | ICD-10-CM

## 2018-07-19 DIAGNOSIS — F900 Attention-deficit hyperactivity disorder, predominantly inattentive type: Secondary | ICD-10-CM | POA: Diagnosis not present

## 2018-07-29 ENCOUNTER — Other Ambulatory Visit: Payer: Self-pay | Admitting: Family Medicine

## 2018-07-29 DIAGNOSIS — M109 Gout, unspecified: Secondary | ICD-10-CM

## 2018-10-04 DIAGNOSIS — K130 Diseases of lips: Secondary | ICD-10-CM | POA: Diagnosis not present

## 2018-10-04 DIAGNOSIS — D101 Benign neoplasm of tongue: Secondary | ICD-10-CM | POA: Insufficient documentation

## 2018-10-05 DIAGNOSIS — D100 Benign neoplasm of lip: Secondary | ICD-10-CM | POA: Diagnosis not present

## 2018-10-05 DIAGNOSIS — D181 Lymphangioma, any site: Secondary | ICD-10-CM | POA: Diagnosis not present

## 2018-10-10 ENCOUNTER — Other Ambulatory Visit: Payer: Self-pay | Admitting: Adult Health

## 2018-10-10 DIAGNOSIS — E7800 Pure hypercholesterolemia, unspecified: Secondary | ICD-10-CM

## 2018-10-11 NOTE — Telephone Encounter (Signed)
Due for cpx on/after 10/29/2018

## 2018-10-13 ENCOUNTER — Other Ambulatory Visit: Payer: Self-pay | Admitting: Family Medicine

## 2018-10-13 DIAGNOSIS — N764 Abscess of vulva: Secondary | ICD-10-CM

## 2018-10-13 NOTE — Telephone Encounter (Signed)
Sent to the pharmacy by e-scribe for 90 days.  Scheduled for cpx on 01/02/2019.

## 2018-10-19 DIAGNOSIS — F900 Attention-deficit hyperactivity disorder, predominantly inattentive type: Secondary | ICD-10-CM | POA: Diagnosis not present

## 2018-10-23 ENCOUNTER — Other Ambulatory Visit: Payer: Self-pay

## 2018-10-24 ENCOUNTER — Encounter: Payer: Self-pay | Admitting: Obstetrics and Gynecology

## 2018-10-24 ENCOUNTER — Ambulatory Visit (INDEPENDENT_AMBULATORY_CARE_PROVIDER_SITE_OTHER): Payer: PPO | Admitting: Obstetrics and Gynecology

## 2018-10-24 ENCOUNTER — Other Ambulatory Visit (HOSPITAL_COMMUNITY)
Admission: RE | Admit: 2018-10-24 | Discharge: 2018-10-24 | Disposition: A | Discharge status: Home / Self Care | Payer: PPO | Source: Ambulatory Visit | Attending: Obstetrics and Gynecology | Admitting: Obstetrics and Gynecology

## 2018-10-24 VITALS — BP 140/90 | HR 76 | Temp 97.9°F | Wt 130.0 lb

## 2018-10-24 DIAGNOSIS — Z01419 Encounter for gynecological examination (general) (routine) without abnormal findings: Secondary | ICD-10-CM | POA: Diagnosis not present

## 2018-10-24 DIAGNOSIS — Z1272 Encounter for screening for malignant neoplasm of vagina: Secondary | ICD-10-CM | POA: Diagnosis not present

## 2018-10-24 DIAGNOSIS — Z90710 Acquired absence of both cervix and uterus: Secondary | ICD-10-CM | POA: Insufficient documentation

## 2018-10-24 DIAGNOSIS — Z113 Encounter for screening for infections with a predominantly sexual mode of transmission: Secondary | ICD-10-CM | POA: Diagnosis not present

## 2018-10-24 DIAGNOSIS — Z1151 Encounter for screening for human papillomavirus (HPV): Secondary | ICD-10-CM | POA: Insufficient documentation

## 2018-10-24 DIAGNOSIS — N907 Vulvar cyst: Secondary | ICD-10-CM | POA: Diagnosis not present

## 2018-10-24 NOTE — Patient Instructions (Signed)
EXERCISE AND DIET:  We recommended that you start or continue a regular exercise program for good health. Regular exercise means any activity that makes your heart beat faster and makes you sweat.  We recommend exercising at least 30 minutes per day at least 3 days a week, preferably 4 or 5.  We also recommend a diet low in fat and sugar.  Inactivity, poor dietary choices and obesity can cause diabetes, heart attack, stroke, and kidney damage, among others.    ALCOHOL AND SMOKING:  Women should limit their alcohol intake to no more than 7 drinks/beers/glasses of wine (combined, not each!) per week. Moderation of alcohol intake to this level decreases your risk of breast cancer and liver damage. And of course, no recreational drugs are part of a healthy lifestyle.  And absolutely no smoking or even second hand smoke. Most people know smoking can cause heart and lung diseases, but did you know it also contributes to weakening of your bones? Aging of your skin?  Yellowing of your teeth and nails?  CALCIUM AND VITAMIN D:  Adequate intake of calcium and Vitamin D are recommended.  The recommendations for exact amounts of these supplements seem to change often, but generally speaking 1,200 mg of calcium (between diet and supplement) and 800 units of Vitamin D per day seems prudent. Certain women may benefit from higher intake of Vitamin D.  If you are among these women, your doctor will have told you during your visit.    PAP SMEARS:  Pap smears, to check for cervical cancer or precancers,  have traditionally been done yearly, although recent scientific advances have shown that most women can have pap smears less often.  However, every woman still should have a physical exam from her gynecologist every year. It will include a breast check, inspection of the vulva and vagina to check for abnormal growths or skin changes, a visual exam of the cervix, and then an exam to evaluate the size and shape of the uterus and  ovaries.  And after 70 years of age, a rectal exam is indicated to check for rectal cancers. We will also provide age appropriate advice regarding health maintenance, like when you should have certain vaccines, screening for sexually transmitted diseases, bone density testing, colonoscopy, mammograms, etc.   MAMMOGRAMS:  All women over 40 years old should have a yearly mammogram. Many facilities now offer a "3D" mammogram, which may cost around $50 extra out of pocket. If possible,  we recommend you accept the option to have the 3D mammogram performed.  It both reduces the number of women who will be called back for extra views which then turn out to be normal, and it is better than the routine mammogram at detecting truly abnormal areas.    COLON CANCER SCREENING: Now recommend starting at age 45. At this time colonoscopy is not covered for routine screening until 50. There are take home tests that can be done between 45-49.   COLONOSCOPY:  Colonoscopy to screen for colon cancer is recommended for all women at age 50.  We know, you hate the idea of the prep.  We agree, BUT, having colon cancer and not knowing it is worse!!  Colon cancer so often starts as a polyp that can be seen and removed at colonscopy, which can quite literally save your life!  And if your first colonoscopy is normal and you have no family history of colon cancer, most women don't have to have it again for   10 years.  Once every ten years, you can do something that may end up saving your life, right?  We will be happy to help you get it scheduled when you are ready.  Be sure to check your insurance coverage so you understand how much it will cost.  It may be covered as a preventative service at no cost, but you should check your particular policy.      Breast Self-Awareness Breast self-awareness means being familiar with how your breasts look and feel. It involves checking your breasts regularly and reporting any changes to your  health care provider. Practicing breast self-awareness is important. A change in your breasts can be a sign of a serious medical problem. Being familiar with how your breasts look and feel allows you to find any problems early, when treatment is more likely to be successful. All women should practice breast self-awareness, including women who have had breast implants. How to do a breast self-exam One way to learn what is normal for your breasts and whether your breasts are changing is to do a breast self-exam. To do a breast self-exam: Look for Changes  1. Remove all the clothing above your waist. 2. Stand in front of a mirror in a room with good lighting. 3. Put your hands on your hips. 4. Push your hands firmly downward. 5. Compare your breasts in the mirror. Look for differences between them (asymmetry), such as: ? Differences in shape. ? Differences in size. ? Puckers, dips, and bumps in one breast and not the other. 6. Look at each breast for changes in your skin, such as: ? Redness. ? Scaly areas. 7. Look for changes in your nipples, such as: ? Discharge. ? Bleeding. ? Dimpling. ? Redness. ? A change in position. Feel for Changes Carefully feel your breasts for lumps and changes. It is best to do this while lying on your back on the floor and again while sitting or standing in the shower or tub with soapy water on your skin. Feel each breast in the following way:  Place the arm on the side of the breast you are examining above your head.  Feel your breast with the other hand.  Start in the nipple area and make  inch (2 cm) overlapping circles to feel your breast. Use the pads of your three middle fingers to do this. Apply light pressure, then medium pressure, then firm pressure. The light pressure will allow you to feel the tissue closest to the skin. The medium pressure will allow you to feel the tissue that is a little deeper. The firm pressure will allow you to feel the tissue  close to the ribs.  Continue the overlapping circles, moving downward over the breast until you feel your ribs below your breast.  Move one finger-width toward the center of the body. Continue to use the  inch (2 cm) overlapping circles to feel your breast as you move slowly up toward your collarbone.  Continue the up and down exam using all three pressures until you reach your armpit.  Write Down What You Find  Write down what is normal for each breast and any changes that you find. Keep a written record with breast changes or normal findings for each breast. By writing this information down, you do not need to depend only on memory for size, tenderness, or location. Write down where you are in your menstrual cycle, if you are still menstruating. If you are having trouble noticing differences   in your breasts, do not get discouraged. With time you will become more familiar with the variations in your breasts and more comfortable with the exam. How often should I examine my breasts? Examine your breasts every month. If you are breastfeeding, the best time to examine your breasts is after a feeding or after using a breast pump. If you menstruate, the best time to examine your breasts is 5-7 days after your period is over. During your period, your breasts are lumpier, and it may be more difficult to notice changes. When should I see my health care provider? See your health care provider if you notice:  A change in shape or size of your breasts or nipples.  A change in the skin of your breast or nipples, such as a reddened or scaly area.  Unusual discharge from your nipples.  A lump or thick area that was not there before.  Pain in your breasts.  Anything that concerns you.  
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
## 2018-10-25 LAB — HEPATITIS C ANTIBODY: Hep C Virus Ab: <0.1 s/co ratio (ref 0.0–0.9)

## 2018-10-25 LAB — HEP, RPR, HIV PANEL
HIV Screen 4th Generation wRfx: NONREACTIVE
Hepatitis B Surface Ag: NEGATIVE
RPR Ser Ql: NONREACTIVE

## 2018-10-31 LAB — CYTOLOGY - PAP
Chlamydia: NEGATIVE
Comment: NEGATIVE
Comment: NEGATIVE
Comment: NEGATIVE
Comment: NORMAL
Diagnosis: NEGATIVE
High risk HPV: NEGATIVE
Neisseria Gonorrhea: NEGATIVE
Trichomonas: NEGATIVE

## 2018-11-02 ENCOUNTER — Other Ambulatory Visit: Payer: Self-pay | Admitting: Adult Health

## 2018-11-02 DIAGNOSIS — Z1231 Encounter for screening mammogram for malignant neoplasm of breast: Secondary | ICD-10-CM

## 2018-12-26 ENCOUNTER — Other Ambulatory Visit: Payer: Self-pay

## 2018-12-26 ENCOUNTER — Ambulatory Visit
Admission: RE | Admit: 2018-12-26 | Discharge: 2018-12-26 | Disposition: A | Discharge status: Home / Self Care | Payer: PPO | Source: Ambulatory Visit | Attending: Adult Health | Admitting: Adult Health

## 2018-12-26 DIAGNOSIS — Z1231 Encounter for screening mammogram for malignant neoplasm of breast: Secondary | ICD-10-CM

## 2019-01-01 ENCOUNTER — Other Ambulatory Visit: Payer: Self-pay

## 2019-01-02 ENCOUNTER — Encounter: Payer: Self-pay | Admitting: Adult Health

## 2019-01-02 ENCOUNTER — Ambulatory Visit (INDEPENDENT_AMBULATORY_CARE_PROVIDER_SITE_OTHER): Payer: PPO | Admitting: Adult Health

## 2019-01-02 VITALS — BP 150/80 | Temp 97.6°F | Wt 138.0 lb

## 2019-01-02 DIAGNOSIS — F339 Major depressive disorder, recurrent, unspecified: Secondary | ICD-10-CM | POA: Diagnosis not present

## 2019-01-02 DIAGNOSIS — M109 Gout, unspecified: Secondary | ICD-10-CM | POA: Diagnosis not present

## 2019-01-02 DIAGNOSIS — F902 Attention-deficit hyperactivity disorder, combined type: Secondary | ICD-10-CM

## 2019-01-02 DIAGNOSIS — Z0000 Encounter for general adult medical examination without abnormal findings: Secondary | ICD-10-CM

## 2019-01-02 DIAGNOSIS — I10 Essential (primary) hypertension: Secondary | ICD-10-CM

## 2019-01-02 DIAGNOSIS — E7800 Pure hypercholesterolemia, unspecified: Secondary | ICD-10-CM

## 2019-01-02 DIAGNOSIS — E039 Hypothyroidism, unspecified: Secondary | ICD-10-CM | POA: Diagnosis not present

## 2019-01-02 LAB — COMPREHENSIVE METABOLIC PANEL
ALT: 19 U/L (ref 0–35)
AST: 23 U/L (ref 0–37)
Albumin: 4.6 g/dL (ref 3.5–5.2)
Alkaline Phosphatase: 117 U/L (ref 39–117)
BUN: 26 mg/dL — ABNORMAL HIGH (ref 6–23)
CO2: 27 mEq/L (ref 19–32)
Calcium: 9.6 mg/dL (ref 8.4–10.5)
Chloride: 104 mEq/L (ref 96–112)
Creatinine, Ser: 0.8 mg/dL (ref 0.40–1.20)
GFR: 70.76 mL/min (ref >60.00)
Glucose, Bld: 111 mg/dL — ABNORMAL HIGH (ref 70–99)
Potassium: 3.9 mEq/L (ref 3.5–5.1)
Sodium: 140 mEq/L (ref 135–145)
Total Bilirubin: 0.3 mg/dL (ref 0.2–1.2)
Total Protein: 7.1 g/dL (ref 6.0–8.3)

## 2019-01-02 LAB — LIPID PANEL
Cholesterol: 217 mg/dL — ABNORMAL HIGH (ref 0–200)
HDL: 52.9 mg/dL (ref >39.00)
NonHDL: 163.73
Total CHOL/HDL Ratio: 4
Triglycerides: 270 mg/dL — ABNORMAL HIGH (ref 0.0–149.0)
VLDL: 54 mg/dL — ABNORMAL HIGH (ref 0.0–40.0)

## 2019-01-02 LAB — CBC WITH DIFFERENTIAL/PLATELET
Basophils Absolute: 0 10*3/uL (ref 0.0–0.1)
Basophils Relative: 0.8 % (ref 0.0–3.0)
Eosinophils Absolute: 0.1 10*3/uL (ref 0.0–0.7)
Eosinophils Relative: 2.5 % (ref 0.0–5.0)
HCT: 40.4 % (ref 36.0–46.0)
Hemoglobin: 13.3 g/dL (ref 12.0–15.0)
Lymphocytes Relative: 28.2 % (ref 12.0–46.0)
Lymphs Abs: 1.7 10*3/uL (ref 0.7–4.0)
MCHC: 32.9 g/dL (ref 30.0–36.0)
MCV: 84.7 fl (ref 78.0–100.0)
Monocytes Absolute: 0.4 10*3/uL (ref 0.1–1.0)
Monocytes Relative: 6.7 % (ref 3.0–12.0)
Neutro Abs: 3.6 10*3/uL (ref 1.4–7.7)
Neutrophils Relative %: 61.8 % (ref 43.0–77.0)
Platelets: 242 10*3/uL (ref 150.0–400.0)
RBC: 4.77 Mil/uL (ref 3.87–5.11)
RDW: 14.5 % (ref 11.5–15.5)
WBC: 5.9 10*3/uL (ref 4.0–10.5)

## 2019-01-02 LAB — LDL CHOLESTEROL, DIRECT: Direct LDL: 137 mg/dL

## 2019-01-02 LAB — HEMOGLOBIN A1C: Hgb A1c MFr Bld: 6.2 % (ref 4.6–6.5)

## 2019-01-02 LAB — TSH: TSH: 1.81 u[IU]/mL (ref 0.35–4.50)

## 2019-01-02 NOTE — Progress Notes (Signed)
Subjective:    Patient ID: Diana [name], female    DOB: 1950-05-16, 68 y.o.   MRN: [id number]  HPI  Patient presents for yearly preventative medicine examination. She is a pleasant 68 year old who  has a past medical history of ADD (attention deficit disorder), Anxiety, Cancer ([place]), Depression, Diverticulosis, DJD (degenerative joint disease), GERD (gastroesophageal reflux disease), History of hiatal hernia, Hypertension, Mood disorder ([place]), SCC (squamous cell carcinoma) in situ (04/22/2010), and STD (sexually transmitted disease).   Essential Hypertension - was on Diazide in the past but is no longer taking this medication. She periodically checks her BP at home and reports " normal readings" but is unable to give me a number.  BP Readings from Last 3 Encounters:  01/02/19 (!) 150/80  10/24/18 140/90  02/21/18 138/88   ADHD -is managed by psychiatry in [place].  Currently prescribed Adderall 40 mg daily  Depression-is followed by psychiatry.  She is currently not prescribed any medication and feels as though she is well controlled without any.  Hyperlipidemia-statin intolerant.  Currently prescribed fenofibrate 48 mcg.  She does tolerate this well BP Readings from Last 3 Encounters:  01/02/19 (!) 150/80  10/24/18 140/90  02/21/18 138/88   H/o Gout -no recent flares.  Hypothyroidism -takes Synthroid 50 mcg Lab Results  Component Value Date   TSH 1.02 02/21/2018   All immunizations and health maintenance protocols were reviewed with the patient and needed orders were placed. She is up to date on vaccinations   Appropriate screening laboratory values were ordered for the patient including screening of hyperlipidemia, renal function and hepatic function.  Medication reconciliation,  past medical history, social history, problem list and allergies were reviewed in detail with the patient  Goals were established with regard to weight loss, exercise, and  diet in  compliance with medications. She stays active and tries to eat healthy.  Wt Readings from Last 3 Encounters:  01/02/19 138 lb (62.6 kg)  10/24/18 130 lb (59 kg)  02/21/18 137 lb (62.1 kg)   Been seen by dermatology and GYN this year.  She is up-to-date on routine screening colonoscopy, dental, and vision exams  Review of Systems  Constitutional: Negative.   HENT: Negative.   Eyes: Negative.   Respiratory: Negative.   Cardiovascular: Negative.   Gastrointestinal: Negative.   Endocrine: Negative.   Genitourinary: Negative.   Musculoskeletal: Negative.   Skin: Negative.   Allergic/Immunologic: Negative.   Neurological: Negative.   Hematological: Negative.   Psychiatric/Behavioral: Negative.    Past Medical History:  Diagnosis Date  . ADD (attention deficit disorder)   . Anxiety   . Cancer ([place])    skin  . Depression   . Diverticulosis   . DJD (degenerative joint disease)   . GERD (gastroesophageal reflux disease)   . History of hiatal hernia   . Hypertension   . Mood disorder ([place])   . SCC (squamous cell carcinoma) in situ 04/22/2010   right forearm  . STD (sexually transmitted disease)    HSV II    Social History   Socioeconomic History  . Marital status: Married    Spouse name: Not on file  . Number of children: 1  . Years of education: Not on file  . Highest education level: Not on file  Occupational History  . Not on file  Tobacco Use  . Smoking status: Former Smoker    Packs/day: 1.00    Years: 20.00    Pack years: 20.00  Types: Cigarettes    Quit date: 06/29/2006    Years since quitting: 12.5  . Smokeless tobacco: Never Used  Substance and Sexual Activity  . Alcohol use: No    Alcohol/week: 0.0 standard drinks    Comment: occ beer  . Drug use: No  . Sexual activity: Not Currently    Birth control/protection: Post-menopausal  Other Topics Concern  . Not on file  Social History Narrative  . Not on file   Social Determinants of Health    Financial Resource Strain:   . Difficulty of Paying Living Expenses: Not on file  Food Insecurity:   . Worried About Charity fundraiser in the Last Year: Not on file  . Ran Out of Food in the Last Year: Not on file  Transportation Needs:   . Lack of Transportation (Medical): Not on file  . Lack of Transportation (Non-Medical): Not on file  Physical Activity:   . Days of Exercise per Week: Not on file  . Minutes of Exercise per Session: Not on file  Stress:   . Feeling of Stress : Not on file  Social Connections:   . Frequency of Communication with Friends and Family: Not on file  . Frequency of Social Gatherings with Friends and Family: Not on file  . Attends Religious Services: Not on file  . Active Member of Clubs or Organizations: Not on file  . Attends Archivist Meetings: Not on file  . Marital Status: Not on file  Intimate Partner Violence:   . Fear of Current or Ex-Partner: Not on file  . Emotionally Abused: Not on file  . Physically Abused: Not on file  . Sexually Abused: Not on file    Past Surgical History:  Procedure Laterality Date  . ABDOMINAL HYSTERECTOMY     partial  . BREAST IMPLANT REMOVAL    . CESAREAN SECTION    . COLON SURGERY     had about 1 foot of bowel removed   . HEMORRHOID SURGERY    . LIPOMA EXCISION N/A 10/23/2014   Procedure: EXCISION OF BACK LIPOMA;  Surgeon: Donnie Mesa, MD;  Location: [facility];  Service: General;  Laterality: N/A;  . PARTIAL COLECTOMY    . PARTIAL HYSTERECTOMY     still has ovaries   . PILONIDAL CYST EXCISION    . PLACEMENT OF BREAST IMPLANTS    . RHINOPLASTY     x 2  . SHOULDER SURGERY Right    06    Family History  Problem Relation Age of Onset  . Melanoma Mother   . Kidney cancer Father   . Breast cancer Paternal Grandmother   . Cancer Maternal Grandmother        possible uterine?     Allergies  Allergen Reactions  . Ezetimibe     REACTION: statins make joints ache  . Latex Hives  .  Morphine Sulfate     REACTION: n \\T \ v    Current Outpatient Medications on File Prior to Visit  Medication Sig Dispense Refill  . Alum Hydroxide-Mag Carbonate (GAVISCON PO) Take by mouth.    Marland Kitchen amphetamine-dextroamphetamine (ADDERALL XR) 20 MG 24 hr capsule TAKE 1 TO 2 CAPSULES BY MOUTH EVERY MORNING  0  . Cholecalciferol (VITAMIN D3) 5000 units CAPS Take 1 capsule by mouth daily.     . colchicine 0.6 MG tablet TAKE 1 TABLET (0.6 MG TOTAL) BY MOUTH DAILY AS NEEDED. 30 tablet 0  . fenofibrate (TRICOR) 48 MG tablet TAKE  1 TABLET (48 MG TOTAL) BY MOUTH DAILY. 90 tablet 0  . ibuprofen (ADVIL,MOTRIN) 200 MG tablet Take 400 mg by mouth every 6 (six) hours as needed.    . Multiple Vitamins-Minerals (MULTIVITAMIN ADULTS PO) Take by mouth daily.    Marland Kitchen SYNTHROID 50 MCG tablet Take 1 tablet (50 mcg total) by mouth daily before breakfast. 90 tablet 3   No current facility-administered medications on file prior to visit.    BP (!) 150/80   Temp 97.6 F (36.4 C) (Temporal)   Wt 138 lb (62.6 kg)   BMI 27.73 kg/m       Objective:   Physical Exam Vitals and nursing note reviewed.  Constitutional:      General: She is not in acute distress.    Appearance: Normal appearance. She is well-developed. She is not ill-appearing, toxic-appearing or diaphoretic.  HENT:     Head: Normocephalic and atraumatic.     Right Ear: Tympanic membrane, ear canal and external ear normal. There is no impacted cerumen.     Left Ear: Tympanic membrane, ear canal and external ear normal. There is no impacted cerumen.     Nose: Nose normal. No congestion or rhinorrhea.     Mouth/Throat:     Mouth: Mucous membranes are moist.     Pharynx: Oropharynx is clear. No oropharyngeal exudate.  Eyes:     General:        Right eye: No discharge.        Left eye: No discharge.     Conjunctiva/sclera: Conjunctivae normal.  Neck:     Thyroid: No thyromegaly.     Trachea: No tracheal deviation.  Cardiovascular:     Rate and  Rhythm: Normal rate and regular rhythm.     Pulses: Normal pulses.     Heart sounds: Normal heart sounds. No murmur. No friction rub. No gallop.   Pulmonary:     Effort: Pulmonary effort is normal. No respiratory distress.     Breath sounds: Normal breath sounds. No stridor. No wheezing, rhonchi or rales.  Chest:     Chest wall: No tenderness.  Abdominal:     General: Bowel sounds are normal. There is no distension.     Palpations: Abdomen is soft. There is no mass.     Tenderness: There is no abdominal tenderness. There is no right CVA tenderness, left CVA tenderness, guarding or rebound.     Hernia: No hernia is present.  Musculoskeletal:        General: No swelling, tenderness, deformity or signs of injury. Normal range of motion.     Cervical back: Normal range of motion and neck supple.     Right lower leg: No edema.     Left lower leg: No edema.  Lymphadenopathy:     Cervical: No cervical adenopathy.  Skin:    General: Skin is warm and dry.     Capillary Refill: Capillary refill takes less than 2 seconds.     Coloration: Skin is not jaundiced or pale.     Findings: No bruising, erythema, lesion or rash.  Neurological:     General: No focal deficit present.     Mental Status: She is alert and oriented to person, place, and time.     Cranial Nerves: No cranial nerve deficit.     Sensory: No sensory deficit.     Motor: No weakness.     Coordination: Coordination normal.     Gait: Gait normal.  Deep Tendon Reflexes: Reflexes normal.  Psychiatric:        Mood and Affect: Mood normal.        Behavior: Behavior normal.        Thought Content: Thought content normal.        Judgment: Judgment normal.       Assessment & Plan:  1. Routine general medical examination at a health care facility - Continue to stay active and exercise - Follow up in one year or sooner if needed - CBC with Differential/Platelet - CMP - Lipid panel - TSH - Hemoglobin A1c  2. Pure  hypercholesterolemia - Consider increase in fenofibrate  - CBC with Differential/Platelet - CMP - Lipid panel - TSH - Hemoglobin A1c  3. Essential hypertension - I will have her monitor her BP at home over the next two weeks and then follow up via Telemedicine  - CBC with Differential/Platelet - CMP - Lipid panel - TSH - Hemoglobin A1c  4. Hypothyroidism, unspecified type - Consider change in synthroid dose  - CBC with Differential/Platelet - CMP - Lipid panel - TSH - Hemoglobin A1c  5. Gout, unspecified cause, unspecified chronicity, unspecified site - no recent flares  6. Attention deficit hyperactivity disorder (ADHD), combined type - Follow up with psychiatry   7. Depression, recurrent ([place]) - Continue with psychiatry    Dorothyann Peng, NP

## 2019-01-02 NOTE — Patient Instructions (Signed)
It was great seeing you today   We will follow up with you regarding your blood work   Please schedule a televisit in two weeks to discuss Blood pressure

## 2019-01-04 ENCOUNTER — Other Ambulatory Visit: Payer: Self-pay | Admitting: Adult Health

## 2019-01-04 ENCOUNTER — Other Ambulatory Visit: Payer: Self-pay

## 2019-01-04 DIAGNOSIS — E7800 Pure hypercholesterolemia, unspecified: Secondary | ICD-10-CM

## 2019-01-04 MED ORDER — FENOFIBRATE 145 MG PO TABS: 145.0000 mg | ORAL_TABLET | Freq: Every day | ORAL | 1 refills | Status: DC

## 2019-01-04 NOTE — Addendum Note (Signed)
Addended by: Rebecca Eaton on: 01/04/2019 10:24 AM   Modules accepted: Orders

## 2019-01-16 ENCOUNTER — Other Ambulatory Visit: Payer: Self-pay

## 2019-01-16 ENCOUNTER — Telehealth (INDEPENDENT_AMBULATORY_CARE_PROVIDER_SITE_OTHER): Payer: PPO | Admitting: Adult Health

## 2019-01-16 DIAGNOSIS — F339 Major depressive disorder, recurrent, unspecified: Secondary | ICD-10-CM

## 2019-01-16 DIAGNOSIS — I10 Essential (primary) hypertension: Secondary | ICD-10-CM

## 2019-01-16 MED ORDER — SERTRALINE HCL 25 MG PO TABS: 25.0000 mg | ORAL_TABLET | Freq: Every day | ORAL | 0 refills | Status: DC
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
## 2019-01-22 DIAGNOSIS — F900 Attention-deficit hyperactivity disorder, predominantly inattentive type: Secondary | ICD-10-CM | POA: Diagnosis not present

## 2019-01-30 DIAGNOSIS — C44311 Basal cell carcinoma of skin of nose: Secondary | ICD-10-CM | POA: Diagnosis not present

## 2019-01-30 DIAGNOSIS — L820 Inflamed seborrheic keratosis: Secondary | ICD-10-CM | POA: Diagnosis not present

## 2019-02-02 DIAGNOSIS — L570 Actinic keratosis: Secondary | ICD-10-CM | POA: Diagnosis not present

## 2019-02-02 DIAGNOSIS — L988 Other specified disorders of the skin and subcutaneous tissue: Secondary | ICD-10-CM | POA: Diagnosis not present

## 2019-02-02 DIAGNOSIS — D171 Benign lipomatous neoplasm of skin and subcutaneous tissue of trunk: Secondary | ICD-10-CM | POA: Diagnosis not present

## 2019-02-02 DIAGNOSIS — D0461 Carcinoma in situ of skin of right upper limb, including shoulder: Secondary | ICD-10-CM | POA: Diagnosis not present

## 2019-02-02 DIAGNOSIS — D485 Neoplasm of uncertain behavior of skin: Secondary | ICD-10-CM | POA: Diagnosis not present

## 2019-02-02 DIAGNOSIS — D225 Melanocytic nevi of trunk: Secondary | ICD-10-CM | POA: Diagnosis not present

## 2019-02-07 ENCOUNTER — Other Ambulatory Visit: Payer: Self-pay | Admitting: Adult Health

## 2019-02-07 DIAGNOSIS — F339 Major depressive disorder, recurrent, unspecified: Secondary | ICD-10-CM

## 2019-02-09 ENCOUNTER — Other Ambulatory Visit: Payer: Self-pay | Admitting: Adult Health

## 2019-02-13 DIAGNOSIS — D225 Melanocytic nevi of trunk: Secondary | ICD-10-CM | POA: Diagnosis not present

## 2019-02-13 DIAGNOSIS — D171 Benign lipomatous neoplasm of skin and subcutaneous tissue of trunk: Secondary | ICD-10-CM | POA: Diagnosis not present

## 2019-02-13 DIAGNOSIS — D485 Neoplasm of uncertain behavior of skin: Secondary | ICD-10-CM | POA: Diagnosis not present

## 2019-02-13 DIAGNOSIS — R52 Pain, unspecified: Secondary | ICD-10-CM | POA: Diagnosis not present

## 2019-02-14 ENCOUNTER — Other Ambulatory Visit: Payer: Self-pay

## 2019-02-14 ENCOUNTER — Telehealth: Payer: Self-pay | Admitting: Adult Health

## 2019-02-14 DIAGNOSIS — F339 Major depressive disorder, recurrent, unspecified: Secondary | ICD-10-CM

## 2019-02-14 MED ORDER — SERTRALINE HCL 25 MG PO TABS: 25.0000 mg | ORAL_TABLET | Freq: Every day | ORAL | 3 refills | Status: DC

## 2019-02-14 NOTE — Telephone Encounter (Signed)
Rx sent to the pharmacy. Pt notified of update.

## 2019-02-14 NOTE — Telephone Encounter (Signed)
She's tolerating it well but just don't think that it is strong enough.  She has one left and needs a refill with the updated dosage.  Sent to:  [id number] [address], [place] Phone:  [phone]  Fax:  [phone]       Please advise

## 2019-02-23 DIAGNOSIS — Z4802 Encounter for removal of sutures: Secondary | ICD-10-CM | POA: Diagnosis not present

## 2019-03-08 ENCOUNTER — Other Ambulatory Visit: Payer: Self-pay | Admitting: Adult Health

## 2019-03-08 DIAGNOSIS — F339 Major depressive disorder, recurrent, unspecified: Secondary | ICD-10-CM

## 2019-04-23 DIAGNOSIS — F900 Attention-deficit hyperactivity disorder, predominantly inattentive type: Secondary | ICD-10-CM | POA: Diagnosis not present

## 2019-04-25 ENCOUNTER — Telehealth: Payer: Self-pay | Admitting: Adult Health

## 2019-04-25 NOTE — Chronic Care Management (AMB) (Signed)
°  Chronic Care Management   Note  04/25/2019 Name: [name] MRN: [id number] DOB: 13-Jul-1950  Diana [name] is a 69 y.o. year old female who is a primary care patient of Dorothyann Peng, NP. I reached out to [name] by phone today in response to a referral sent by Ms. Lorenda Cahill PCP, Dorothyann Peng, NP.   Diana [name] was given information about Chronic Care Management services today including:  1. CCM service includes personalized support from designated clinical staff supervised by her physician, including individualized plan of care and coordination with other care providers 2. 24/7 contact phone numbers for assistance for urgent and routine care needs. 3. Service will only be billed when office clinical staff spend 20 minutes or more in a month to coordinate care. 4. Only one practitioner may furnish and bill the service in a calendar month. 5. The patient may stop CCM services at any time (effective at the end of the month) by phone call to the office staff.   Patient agreed to services and verbal consent obtained.   Follow up plan:   Raynicia Dukes [employer] Scheduler

## 2019-04-25 NOTE — Progress Notes (Signed)
  Chronic Care Management   Outreach Note  04/25/2019 Name: Diana [name] MRN: [id number] DOB: 1950/07/02  Referred by: Dorothyann Peng, NP Reason for referral : No chief complaint on file.   An unsuccessful telephone outreach was attempted today. The patient was referred to the pharmacist for assistance with care management and care coordination.   Follow Up Plan:      Raynicia Dukes [employer] Scheduler

## 2019-05-18 ENCOUNTER — Other Ambulatory Visit: Payer: Self-pay | Admitting: Adult Health

## 2019-05-18 DIAGNOSIS — R7989 Other specified abnormal findings of blood chemistry: Secondary | ICD-10-CM

## 2019-05-18 DIAGNOSIS — I10 Essential (primary) hypertension: Secondary | ICD-10-CM

## 2019-05-21 ENCOUNTER — Ambulatory Visit: Payer: PPO

## 2019-05-21 ENCOUNTER — Other Ambulatory Visit: Payer: Self-pay

## 2019-05-21 DIAGNOSIS — E039 Hypothyroidism, unspecified: Secondary | ICD-10-CM

## 2019-05-21 DIAGNOSIS — F902 Attention-deficit hyperactivity disorder, combined type: Secondary | ICD-10-CM

## 2019-05-21 DIAGNOSIS — E7800 Pure hypercholesterolemia, unspecified: Secondary | ICD-10-CM

## 2019-05-21 DIAGNOSIS — M109 Gout, unspecified: Secondary | ICD-10-CM

## 2019-05-21 DIAGNOSIS — F339 Major depressive disorder, recurrent, unspecified: Secondary | ICD-10-CM

## 2019-05-21 DIAGNOSIS — K219 Gastro-esophageal reflux disease without esophagitis: Secondary | ICD-10-CM

## 2019-05-21 DIAGNOSIS — I10 Essential (primary) hypertension: Secondary | ICD-10-CM

## 2019-05-21 NOTE — Chronic Care Management (AMB) (Signed)
Chronic Care Management Pharmacy  Name: Diana [name]  MRN: [id number] DOB: 06-Jan-1951  Initial Questions: 1. Have you seen any other providers since your last visit? NA 2. Any changes in your medicines or health? No   Chief Complaint/ HPI  Diana [name],  69 y.o. , female presents for their Initial CCM visit with the clinical pharmacist via telephone due to COVID-19 Pandemic.  PCP : Dorothyann Peng, NP  Their chronic conditions include: HTN, HLD, Hypothyroidism, ADHD, depression, gout   Office Visits: 01/16/2019- Dorothyann Peng, NP- Patient presented for virtual visit for HTN follow up and depression. Patient was previously on Diazide, but no longer taking. Home reported BP readings; [phone]. Patient to continue with no BP medications. For depression, patient to try Zoloft 25mg , 1 tablet once daily. Patient to follow up in 1 month or sooner if needed.   01/02/2019- Dorothyann Peng, NP- Patient presented for office visit for annual exam. BP: 150/80. Home BP readings: "normal readings" on no BP medications. Patient to follow up in 1 year or sooner if needed. Labs ordered: CBC, CMP, lipid panel, TSH, A1c. Patient to check BP over next 2 weeks and follow up.   Consult Visit: 02/02/2019; 02/13/2019- dermatology- [facility].    Medications: Outpatient Encounter Medications as of 05/21/2019  Medication Sig  . Alum Hydroxide-Mag Carbonate (GAVISCON PO) Take by mouth as needed.   Marland Kitchen amphetamine-dextroamphetamine (ADDERALL XR) 20 MG 24 hr capsule TAKE 1 TO 2 CAPSULES BY MOUTH EVERY MORNING  . Ascorbic Acid (VITAMIN C WITH ROSE HIPS) 500 MG tablet Take 500 mg by mouth daily.  . Cholecalciferol (VITAMIN D3) 50 MCG (2000 UT) TABS Take 1 capsule by mouth daily.   . colchicine 0.6 MG tablet TAKE 1 TABLET (0.6 MG TOTAL) BY MOUTH DAILY AS NEEDED.  . famotidine (PEPCID) 20 MG tablet Take 20 mg by mouth 2 (two) times daily.  . fenofibrate (TRICOR) 145 MG tablet Take 1 tablet (145 mg total)  by mouth daily.  Marland Kitchen ibuprofen (ADVIL,MOTRIN) 200 MG tablet Take 400 mg by mouth every 6 (six) hours as needed.  . Multiple Vitamins-Minerals (ZINC PO) Take 20 mg by mouth daily.  . Omega-3 Fatty Acids (OMEGA 3 PO) Take 1 capsule by mouth daily.  . sertraline (ZOLOFT) 50 MG tablet Take 50 mg by mouth daily.  Marland Kitchen SYNTHROID 50 MCG tablet TAKE 1 TABLET BY MOUTH DAILY BEFORE BREAKFAST  . [DISCONTINUED] omeprazole (PRILOSEC) 20 MG capsule Take 20 mg by mouth daily.  . Multiple Vitamins-Minerals (MULTIVITAMIN ADULTS PO) Take by mouth daily.  . sertraline (ZOLOFT) 25 MG tablet TAKE 1 TABLET BY MOUTH EVERY DAY (Patient not taking: Reported on 05/21/2019)   No facility-administered encounter medications on file as of 05/21/2019.     Current Diagnosis/Assessment:  Goals Addressed            This Visit's Progress   . Pharmacy Care Plan       CARE PLAN ENTRY  Current Barriers:  . Chronic Disease Management support, education, and care coordination needs related to Hypertension, Hyperlipidemia, Gastroesophageal Reflux Disease, Hypothyroidism, Depression, and Gout, ADHD   Hypertension . Pharmacist Clinical Goal(s): o Over the next 180 days, patient will work with PharmD and providers to maintain BP goal <130/80 . Current regimen:  o No medications (lifestyle modifications) . Interventions: o Discussed diet modifications. DASH diet:  following a diet emphasizing fruits and vegetables and low-fat dairy products along with whole grains, fish, poultry, and nuts. Reducing red meats and sugars .  Patient self care activities - Over the next 180 days, patient will: o Check BP several times per month, document, and provide at future appointments  Hyperlipidemia . Pharmacist Clinical Goal(s): o Over the next 180 days, patient will work with PharmD and providers to achieve LDL goal < 100 . Current regimen:  o Fenofibrate 145mg , 1 tablet once daily  . Interventions: . How to reduce cholesterol through  diet/weight management and physical activity.    . We discussed how a diet high in plant sterols (fruits/vegetables/nuts/whole grains/legumes) may reduce your cholesterol.  Encouraged increasing fiber to a daily intake of 10-25g/day  . Patient self care activities - Over the next 30 days, patient will: o Schedule lab visit for repeat cholesterol levels.   Hypothyroidism . Pharmacist Clinical Goal(s): o Over the next 180 days, patient will work with PharmD and providers to maintain TSH between 0.45 to 4.5uIU/ml . Current regimen:  o Synthroid 27mcg, 1 tablet once daily before breakfast  . Patient self care activities - Over the next 180 days, patient will: o Continue current medications.   Gout . Pharmacist Clinical Goal(s) o Over the next 180 days, patient will work with PharmD and providers to minimize gout flare-ups.  . Current regimen:  o Colchicine 0.6mg , 1 tablet daily as needed . Interventions: o Eating a low-purine diet. Avoid foods and drinks such as: liver, kidney, anchovies, asparagus, herring, mushrooms, mussels, beer,etc. o Cautioned restart of hydrochlorothiazide in gout. . Patient self care activities - Over the next 180 days, patient will: o Continue current medications as needed.   GERD . Pharmacist Clinical Goal(s) o Over the next 30 days, patient will work with PharmD and providers to minimize GERD symptoms.  . Current regimen:   Omeprazole 20mg , 1 tablet once daily   Famotidine 20mg , 1 tablet twice daily as needed  Gaviscon as needed . Interventions: o We discussed:  non-pharmacological interventions for acid reflux. Take measures to prevent acid reflux, such as avoiding spicy foods, avoiding caffeine, avoid laying down a few hours after eating, and raising the head of the bed o Recommend increasing omeprazole to 40mg  once daily  o Vitamin B-12 levels and long term use omeprazole.  . Patient self care activities, over next 30 days patient will: o Increase  omeprazole to 40mg  once daily o Obtain vitamin B12 blood work on same day as cholesterol recheck.   ADHD: . Pharmacist Clinical Goal(s) o Over the next 180 days, patient will continue current medications and follow up with Dr. Marvia Pickles.  . Current regimen:  o Adderall XR 20mg , 1 to 2 capsules every morning  . Patient self care activities-  o Patient will continue current medications.   Depression . Pharmacist Clinical Goal(s) o Over the next 180 days, patient will continue current medications and follow up with Dr. Marvia Pickles.  . Current regimen:  o Sertraline 50mg , 1 tablet once daily  . Patient self care activities o Patient will continue current medications.    Medication management . Pharmacist Clinical Goal(s): o Over the next 180 days, patient will work with PharmD and providers to maintain optimal medication adherence . Current [facility]: [facility] . Interventions o Comprehensive medication review performed. o Continue current medication management strategy . Patient self care activities - Over the next 180 days, patient will: o Take medications as prescribed o Report any questions or concerns to PharmD and/or provider(s)  Initial goal documentation       SDOH Interventions     Most Recent  Value  SDOH Interventions  SDOH Interventions for the Following Domains  Transportation, Financial Strain  Financial Strain Interventions  Other (Comment) [Not needed]  Transportation Interventions  Other (Comment) [Not needed]       Hypertension  Denies chest pain, reports blurred vision on some days but states is not related to BP.  Office blood pressures are  BP Readings from Last 3 Encounters:  01/02/19 (!) 150/80  10/24/18 140/90  02/21/18 138/88   Patient was these meds in the past: triamterene/ HCTZ (can exacerbate gout)   Patient checks BP at home several times per month  Patient home BP readings are ranging: unable to provide exact readings; states <140/90.    Patient is controlled on:   No medications   We discussed diet and exercise extensively  . Discussed diet modifications. DASH diet:  following a diet emphasizing fruits and vegetables and low-fat dairy products along with whole grains, fish, poultry, and nuts. Reducing red meats and sugars.  - Patient stated she has changed diet. She states incorporating more vegetables.  . Exercising - Patient reported she has been more active. States walking several times per week.   Caution HCTZ in setting of gout.   Plan Continue control with diet and exercise.    Hyperlipidemia   Lipid Panel     Component Value Date/Time   CHOL 217 (H) 01/02/2019 0830   TRIG 270.0 (H) 01/02/2019 0830   HDL 52.90 01/02/2019 0830   CHOLHDL 4 01/02/2019 0830   VLDL 54.0 (H) 01/02/2019 0830   LDLCALC 114 (H) 10/27/2017 0908   LDLDIRECT 137.0 01/02/2019 0830    The 10-year ASCVD risk score Mikey Bussing DC Jr., et al., 2013) is: 14.5%   Values used to calculate the score:     Age: 84 years     Sex: Female     Is Non-Hispanic African American: No     Diabetic: No     Tobacco smoker: No     Systolic Blood Pressure: [id number] mmHg     Is BP treated: No     HDL Cholesterol: 52.9 mg/dL     Total Cholesterol: 217 mg/dL   Patient has failed these meds in past: statins (intolerant due to joints ache).   Patient is currently uncontrolled on the following medications:   Fenofibrate 145mg , 1 tablet once daily  (dose was increased due to results of last lipid panel).   We discussed:  diet and exercise extensively   Plan Lipid recheck 06/2019 for further assessment. Patient to make visit for lipid panel recheck. Continue current medications  Hypothyroidism   TSH  Date Value Ref Range Status  01/02/2019 1.81 0.35 - 4.50 uIU/mL Final    Patient has failed these meds in past: none  Patient is currently controlled on the following medications:   Synthroid 29mcg, 1 tablet once daily before breakfast    Plan Continue current medications   Gout   Patient denies recent gout flare-ups. She reports not needed to take colchicine in over a year. She also reported eating liver (even though she knows it may trigger a flare-up).   Patient is currently controlled on the following medications:   Colchicine 0.6mg , 1 tablet daily as needed  We discussed:  Eating a low-purine diet. Avoid foods and drinks such as: liver, kidney, anchovies, asparagus, herring, mushrooms, mussels, beer,etc.  Plan Continue current medications.   GERD  Patient endorses nighttime reflux. She stated avoiding food triggers and has been sleeping propped up in bed.  Patient inquired on effects of long term intake of omeprazole and vitamin B12.   Patient has failed these meds in past: ranitidine  Patient is currently uncontrolled on the following medications:   Omeprazole 20mg , 1 tablet once daily   Famotidine 20mg , 1 tablet twice daily as needed (will take if forgets dose of omeprazole)    Gaviscon as needed (patient reports taking if GERD wakes her up)   We discussed:  non-pharmacological interventions for acid reflux. Take measures to prevent acid reflux, such as avoiding spicy foods, avoiding caffeine, avoid laying down a few hours after eating, and raising the head of the bed.  - discussed decrease levels of vitamin B12 in long term use of PPIs.   Plan Recommend increasing omeprazole to 40mg  once daily.  Will consult with Tommi Rumps about dose increase.  Recommend vitamin B12 lab at same time for lab visit for lipid panel.   ADHD  Patient has failed these meds in past: none Patient is currently controlled on the following medications:   Adderall XR 20mg , 1 to 2 capsules every morning   Plan Managed by Marvia Pickles  Continue current medications.   Depression   Patient sees Marvia Pickles (psychiatry). Patient mentioned sertraline 25mg  was started by [facility]. At previous visit, patient stated Dr. Bobetta Lime  increased dose to 50mg  to see better effect.     Patient has failed these meds in past: none Patient is currently controlled on the following medications:   Sertraline 50mg , 1 tablet once daily   Plan Managed by Marvia Pickles  Continue current medications  OTC/ supplements/ Misc  Patient is currently on the following:   Cholecalciferol (vitamin D3) 2000 units, 1 capsule once daily  Ibuprofen 200mg , 2 tablets every six hours as needed (patient states taking 1-2 times/week for arthritis on base of thumb)     Medication Management  Patient organizes medications: uses pill box (fills it up for 2 weeks in advance)  [facility]  Adherence: no gaps in refill history (per medication dispense history from 11/22/2018 to 05/21/2019)    Follow up Follow up visit with PharmD in 6 months. Will conduct general telephone calls for periodic check-ins before next visit.  Recommend vitamin B12 level at same lab visit for next lipid panel (06/2019).  Recommend increasing Omeprazole 20mg  to 40mg  daily.      Anson Crofts, PharmD Clinical Pharmacist [facility] [place] [phone]

## 2019-05-23 NOTE — Patient Instructions (Addendum)
Visit Information  Goals Addressed            This Visit's Progress   . Pharmacy Care Plan       CARE PLAN ENTRY  Current Barriers:  . Chronic Disease Management support, education, and care coordination needs related to Hypertension, Hyperlipidemia, Gastroesophageal Reflux Disease, Hypothyroidism, Depression, and Gout, ADHD   Hypertension . Pharmacist Clinical Goal(s): o Over the next 180 days, patient will work with PharmD and providers to maintain BP goal <130/80 . Current regimen:  o No medications (lifestyle modifications) . Interventions: o Discussed diet modifications. DASH diet:  following a diet emphasizing fruits and vegetables and low-fat dairy products along with whole grains, fish, poultry, and nuts. Reducing red meats and sugars . Patient self care activities - Over the next 180 days, patient will: o Check BP several times per month, document, and provide at future appointments  Hyperlipidemia . Pharmacist Clinical Goal(s): o Over the next 180 days, patient will work with PharmD and providers to achieve LDL goal < 100 . Current regimen:  o Fenofibrate 145mg , 1 tablet once daily  . Interventions: . How to reduce cholesterol through diet/weight management and physical activity.    . We discussed how a diet high in plant sterols (fruits/vegetables/nuts/whole grains/legumes) may reduce your cholesterol.  Encouraged increasing fiber to a daily intake of 10-25g/day  . Patient self care activities - Over the next 30 days, patient will: o Schedule lab visit for repeat cholesterol levels.   Hypothyroidism . Pharmacist Clinical Goal(s): o Over the next 180 days, patient will work with PharmD and providers to maintain TSH between 0.45 to 4.5uIU/ml . Current regimen:  o Synthroid 92mcg, 1 tablet once daily before breakfast  . Patient self care activities - Over the next 180 days, patient will: o Continue current medications.   Gout . Pharmacist Clinical Goal(s) o Over  the next 180 days, patient will work with PharmD and providers to minimize gout flare-ups.  . Current regimen:  o Colchicine 0.6mg , 1 tablet daily as needed . Interventions: o Eating a low-purine diet. Avoid foods and drinks such as: liver, kidney, anchovies, asparagus, herring, mushrooms, mussels, beer,etc. o Cautioned restart of hydrochlorothiazide in gout. . Patient self care activities - Over the next 180 days, patient will: o Continue current medications as needed.   GERD . Pharmacist Clinical Goal(s) o Over the next 30 days, patient will work with PharmD and providers to minimize GERD symptoms.  . Current regimen:   Omeprazole 20mg , 1 tablet once daily   Famotidine 20mg , 1 tablet twice daily as needed  Gaviscon as needed . Interventions: o We discussed:  non-pharmacological interventions for acid reflux. Take measures to prevent acid reflux, such as avoiding spicy foods, avoiding caffeine, avoid laying down a few hours after eating, and raising the head of the bed o Recommend increasing omeprazole to 40mg  once daily  o Vitamin B-12 levels and long term use omeprazole.  . Patient self care activities, over next 30 days patient will: o Increase omeprazole to 40mg  once daily o Obtain vitamin B12 blood work on same day as cholesterol recheck.   ADHD: . Pharmacist Clinical Goal(s) o Over the next 180 days, patient will continue current medications and follow up with Dr. Marvia Pickles.  . Current regimen:  o Adderall XR 20mg , 1 to 2 capsules every morning  . Patient self care activities-  o Patient will continue current medications.   Depression . Pharmacist Clinical Goal(s) o Over the next 180  days, patient will continue current medications and follow up with Dr. Marvia Pickles.  . Current regimen:  o Sertraline 50mg , 1 tablet once daily  . Patient self care activities o Patient will continue current medications.    Medication management . Pharmacist Clinical  Goal(s): o Over the next 180 days, patient will work with PharmD and providers to maintain optimal medication adherence . [facility] . Interventions o Comprehensive medication review performed. o Continue current medication management strategy . Patient self care activities - Over the next 180 days, patient will: o Take medications as prescribed o Report any questions or concerns to PharmD and/or provider(s)  Initial goal documentation        Diana [name] was given information about Chronic Care Management services today including:  1. CCM service includes personalized support from designated clinical staff supervised by her physician, including individualized plan of care and coordination with other care providers 2. 24/7 contact phone numbers for assistance for urgent and routine care needs. 3. Standard insurance, coinsurance, copays and deductibles apply for chronic care management only during months in which we provide at least 20 minutes of these services. Most insurances cover these services at 100%, however patients may be responsible for any copay, coinsurance and/or deductible if applicable. This service may help you avoid the need for more expensive face-to-face services. 4. Only one practitioner may furnish and bill the service in a calendar month. 5. The patient may stop CCM services at any time (effective at the end of the month) by phone call to the office staff.  Patient agreed to services and verbal consent obtained.   The patient verbalized understanding of instructions provided today and agreed to receive a mailed copy of patient instruction and/or educational materials. Telephone follow up appointment with pharmacy team member scheduled for: 11/21/2019  Anson Crofts, PharmD Clinical Pharmacist [facility] [place] [phone]   Food Choices for Gastroesophageal Reflux Disease, Adult When you have gastroesophageal reflux disease  (GERD), the foods you eat and your eating habits are very important. Choosing the right foods can help ease your discomfort. Think about working with a nutrition specialist (dietitian) to help you make good choices. What are tips for following this plan?  Meals  Choose healthy foods that are low in fat, such as fruits, vegetables, whole grains, low-fat dairy products, and lean meat, fish, and poultry.  Eat small meals often instead of 3 large meals a day. Eat your meals slowly, and in a place where you are relaxed. Avoid bending over or lying down until 2-3 hours after eating.  Avoid eating meals 2-3 hours before bed.  Avoid drinking a lot of liquid with meals.  Cook foods using methods other than frying. Bake, grill, or broil food instead.  Avoid or limit: ? Chocolate. ? Peppermint or spearmint. ? Alcohol. ? Pepper. ? Black and decaffeinated coffee. ? Black and decaffeinated tea. ? Bubbly (carbonated) soft drinks. ? Caffeinated energy drinks and soft drinks.  Limit high-fat foods such as: ? Fatty meat or fried foods. ? Whole milk, cream, butter, or ice cream. ? Nuts and nut butters. ? Pastries, donuts, and sweets made with butter or shortening.  Avoid foods that cause symptoms. These foods may be different for everyone. Common foods that cause symptoms include: ? Tomatoes. ? Oranges, lemons, and limes. ? Peppers. ? Spicy food. ? Onions and garlic. ? Vinegar. Lifestyle  Maintain a healthy weight. Ask your doctor what weight is healthy for you. If  you need to lose weight, work with your doctor to do so safely.  Exercise for at least 30 minutes for 5 or more days each week, or as told by your doctor.  Wear loose-fitting clothes.  Do not smoke. If you need help quitting, ask your doctor.  Sleep with the head of your bed higher than your feet. Use a wedge under the mattress or blocks under the bed frame to raise the head of the bed. Summary  When you have  gastroesophageal reflux disease (GERD), food and lifestyle choices are very important in easing your symptoms.  Eat small meals often instead of 3 large meals a day. Eat your meals slowly, and in a place where you are relaxed.  Limit high-fat foods such as fatty meat or fried foods.  Avoid bending over or lying down until 2-3 hours after eating.  Avoid peppermint and spearmint, caffeine, alcohol, and chocolate. This information is not intended to replace advice given to you by your health care provider. Make sure you discuss any questions you have with your health care provider. Document Revised: 04/27/2018 Document Reviewed: 02/10/2016 Elsevier Patient Education  [place].

## 2019-05-24 ENCOUNTER — Other Ambulatory Visit: Payer: Self-pay | Admitting: Adult Health

## 2019-05-24 DIAGNOSIS — E7800 Pure hypercholesterolemia, unspecified: Secondary | ICD-10-CM

## 2019-05-24 DIAGNOSIS — K219 Gastro-esophageal reflux disease without esophagitis: Secondary | ICD-10-CM

## 2019-05-24 MED ORDER — OMEPRAZOLE 40 MG PO CPDR: 40.0000 mg | DELAYED_RELEASE_CAPSULE | Freq: Every day | ORAL | 3 refills | Status: DC

## 2019-05-30 ENCOUNTER — Other Ambulatory Visit: Payer: Self-pay

## 2019-05-31 ENCOUNTER — Other Ambulatory Visit (INDEPENDENT_AMBULATORY_CARE_PROVIDER_SITE_OTHER): Payer: PPO

## 2019-05-31 DIAGNOSIS — E7800 Pure hypercholesterolemia, unspecified: Secondary | ICD-10-CM

## 2019-05-31 DIAGNOSIS — K219 Gastro-esophageal reflux disease without esophagitis: Secondary | ICD-10-CM

## 2019-05-31 LAB — LIPID PANEL
Cholesterol: 209 mg/dL — ABNORMAL HIGH (ref 0–200)
HDL: 47.3 mg/dL (ref >39.00)
LDL Cholesterol: 123 mg/dL — ABNORMAL HIGH (ref 0–99)
NonHDL: 162.13
Total CHOL/HDL Ratio: 4
Triglycerides: 196 mg/dL — ABNORMAL HIGH (ref 0.0–149.0)
VLDL: 39.2 mg/dL (ref 0.0–40.0)

## 2019-05-31 LAB — HEPATIC FUNCTION PANEL
ALT: 21 U/L (ref 0–35)
AST: 28 U/L (ref 0–37)
Albumin: 4.7 g/dL (ref 3.5–5.2)
Alkaline Phosphatase: 81 U/L (ref 39–117)
Bilirubin, Direct: 0.1 mg/dL (ref 0.0–0.3)
Total Bilirubin: 0.4 mg/dL (ref 0.2–1.2)
Total Protein: 7.5 g/dL (ref 6.0–8.3)

## 2019-05-31 LAB — VITAMIN B12: Vitamin B-12: 447 pg/mL (ref 211–911)

## 2019-06-07 DIAGNOSIS — F900 Attention-deficit hyperactivity disorder, predominantly inattentive type: Secondary | ICD-10-CM | POA: Diagnosis not present

## 2019-07-07 ENCOUNTER — Other Ambulatory Visit: Payer: Self-pay | Admitting: Adult Health

## 2019-07-10 NOTE — Telephone Encounter (Signed)
SENT TO THE PHARMACY BY E-SCRIBE. 

## 2019-08-16 DIAGNOSIS — F900 Attention-deficit hyperactivity disorder, predominantly inattentive type: Secondary | ICD-10-CM | POA: Diagnosis not present

## 2019-08-24 DIAGNOSIS — L820 Inflamed seborrheic keratosis: Secondary | ICD-10-CM | POA: Diagnosis not present

## 2019-08-24 DIAGNOSIS — L570 Actinic keratosis: Secondary | ICD-10-CM | POA: Diagnosis not present

## 2019-08-24 DIAGNOSIS — D2261 Melanocytic nevi of right upper limb, including shoulder: Secondary | ICD-10-CM | POA: Diagnosis not present

## 2019-08-24 DIAGNOSIS — D1801 Hemangioma of skin and subcutaneous tissue: Secondary | ICD-10-CM | POA: Diagnosis not present

## 2019-08-24 DIAGNOSIS — Z85828 Personal history of other malignant neoplasm of skin: Secondary | ICD-10-CM | POA: Diagnosis not present

## 2019-08-24 DIAGNOSIS — D2272 Melanocytic nevi of left lower limb, including hip: Secondary | ICD-10-CM | POA: Diagnosis not present

## 2019-08-24 DIAGNOSIS — D2271 Melanocytic nevi of right lower limb, including hip: Secondary | ICD-10-CM | POA: Diagnosis not present

## 2019-08-24 DIAGNOSIS — L814 Other melanin hyperpigmentation: Secondary | ICD-10-CM | POA: Diagnosis not present

## 2019-08-24 DIAGNOSIS — D2262 Melanocytic nevi of left upper limb, including shoulder: Secondary | ICD-10-CM | POA: Diagnosis not present

## 2019-08-24 DIAGNOSIS — D225 Melanocytic nevi of trunk: Secondary | ICD-10-CM | POA: Diagnosis not present

## 2019-08-24 DIAGNOSIS — L821 Other seborrheic keratosis: Secondary | ICD-10-CM | POA: Diagnosis not present

## 2019-10-31 DIAGNOSIS — D2261 Melanocytic nevi of right upper limb, including shoulder: Secondary | ICD-10-CM | POA: Diagnosis not present

## 2019-10-31 DIAGNOSIS — D225 Melanocytic nevi of trunk: Secondary | ICD-10-CM | POA: Diagnosis not present

## 2019-11-15 DIAGNOSIS — F900 Attention-deficit hyperactivity disorder, predominantly inattentive type: Secondary | ICD-10-CM | POA: Diagnosis not present

## 2019-11-20 ENCOUNTER — Telehealth: Payer: Self-pay | Admitting: Pharmacist

## 2019-11-20 NOTE — Chronic Care Management (AMB) (Signed)
I left the patient a message about his upcoming appointment on 11-21-2019 @ 11:00 am  with the clinical pharmacist. He was asked to please have all medication on had to review the pharmacist.  Maia Breslow, [place] Assistant [phone]

## 2019-11-21 ENCOUNTER — Ambulatory Visit: Payer: PPO | Admitting: Pharmacist

## 2019-11-21 DIAGNOSIS — I10 Essential (primary) hypertension: Secondary | ICD-10-CM

## 2019-11-21 DIAGNOSIS — E7800 Pure hypercholesterolemia, unspecified: Secondary | ICD-10-CM

## 2019-11-21 NOTE — Chronic Care Management (AMB) (Signed)
Chronic Care Management Pharmacy  Name: Diana [name]  MRN: [id number] DOB: 01-Oct-1950  Initial Questions: 1. Have you seen any other providers since your last visit? NA 2. Any changes in your medicines or health? No   Chief Complaint/ HPI  Diana [name],  69 y.o. , female presents for their Follow-Up CCM visit with the clinical pharmacist via telephone due to COVID-19 Pandemic.  PCP : Dorothyann Peng, NP  Their chronic conditions include: HTN, HLD, Hypothyroidism, ADHD, depression, gout   Office Visits: 01/16/2019 Dorothyann Peng, NP- Patient presented for virtual visit for HTN follow up and depression. Patient was previously on Diazide, but no longer taking. Home reported BP readings; [phone]. Patient to continue with no BP medications. For depression, patient to try Zoloft 25mg , 1 tablet once daily. Patient to follow up in 1 month or sooner if needed.   01/02/2019- Dorothyann Peng, NP- Patient presented for office visit for annual exam. BP: 150/80. Home BP readings: "normal readings" on no BP medications. Patient to follow up in 1 year or sooner if needed. Labs ordered: CBC, CMP, lipid panel, TSH, A1c. Patient to check BP over next 2 weeks and follow up.   Consult Visit: 08/24/19 Harriett Sine (dermatology): Patient presented for follow up. Unable to access notes.  Medications: Outpatient Encounter Medications as of 11/21/2019  Medication Sig  . Alum Hydroxide-Mag Carbonate (GAVISCON PO) Take by mouth as needed.   Marland Kitchen amphetamine-dextroamphetamine (ADDERALL XR) 20 MG 24 hr capsule TAKE 1 TO 2 CAPSULES BY MOUTH EVERY MORNING  . Ascorbic Acid (VITAMIN C WITH ROSE HIPS) 500 MG tablet Take 500 mg by mouth daily.  . Cholecalciferol (VITAMIN D3) 50 MCG (2000 UT) TABS Take 1 capsule by mouth daily.   . colchicine 0.6 MG tablet TAKE 1 TABLET (0.6 MG TOTAL) BY MOUTH DAILY AS NEEDED.  . famotidine (PEPCID) 20 MG tablet Take 20 mg by mouth 2 (two) times daily.  . fenofibrate (TRICOR)  145 MG tablet TAKE 1 TABLET BY MOUTH EVERY DAY  . ibuprofen (ADVIL,MOTRIN) 200 MG tablet Take 400 mg by mouth every 6 (six) hours as needed.  . Multiple Vitamins-Minerals (MULTIVITAMIN ADULTS PO) Take by mouth daily.  . Multiple Vitamins-Minerals (ZINC PO) Take 20 mg by mouth daily.  . Omega-3 Fatty Acids (OMEGA 3 PO) Take 1 capsule by mouth daily.  Marland Kitchen omeprazole (PRILOSEC) 40 MG capsule Take 1 capsule (40 mg total) by mouth daily.  . sertraline (ZOLOFT) 25 MG tablet TAKE 1 TABLET BY MOUTH EVERY DAY (Patient not taking: Reported on 05/21/2019)  . sertraline (ZOLOFT) 50 MG tablet Take 50 mg by mouth daily.  Marland Kitchen SYNTHROID 50 MCG tablet TAKE 1 TABLET BY MOUTH DAILY BEFORE BREAKFAST   No facility-administered encounter medications on file as of 11/21/2019.     Current Diagnosis/Assessment:  Goals Addressed            This Visit's Progress   . Pharmacy Care Plan       CARE PLAN ENTRY  Current Barriers:  . Chronic Disease Management support, education, and care coordination needs related to Hypertension, Hyperlipidemia, Gastroesophageal Reflux Disease, Hypothyroidism, Depression, and Gout, ADHD   Hypertension . Pharmacist Clinical Goal(s): o Over the next 180 days, patient will work with PharmD and providers to maintain BP goal <130/80 . Current regimen:  o No medications (lifestyle modifications) . Interventions: o Discussed diet modifications. DASH diet:  following a diet emphasizing fruits and vegetables and low-fat dairy products along with whole grains, fish, poultry, and  nuts. Reducing red meats and sugars . Patient self care activities - Over the next 180 days, patient will: o Check blood pressure a few times per month, document, and provide at future appointments  Hyperlipidemia . Pharmacist Clinical Goal(s): o Over the next 180 days, patient will work with PharmD and providers to achieve LDL goal < 100 . Current regimen:  o Fenofibrate 145mg , 1 tablet once daily   . Interventions: . Lowering cholesterol through diet by: Marland Kitchen Limiting foods with cholesterol such as liver and other organ meats, egg yolks, shrimp, and whole milk dairy products . Avoiding saturated fats and trans fats and incorporating healthier fats, such as lean meat, nuts, and unsaturated oils like canola and olive oils . Eating foods with soluble fiber such as whole-grain cereals such as oatmeal and oat bran, fruits such as apples, bananas, oranges, pears, and prunes, legumes such as kidney beans, lentils, chick peas, black-eyed peas, and lima beans, and green leafy vegetables . Limiting alcohol intake . Patient self care activities - Over the next 180 days, patient will: o Continue working on lifestyle and diet modifications to lower cholesterol  Hypothyroidism . Pharmacist Clinical Goal(s): o Over the next 180 days, patient will work with PharmD and providers to maintain TSH between 0.45 to 4.5uIU/ml . Current regimen:  o Synthroid 33mcg, 1 tablet once daily before breakfast  . Patient self care activities - Over the next 180 days, patient will: o Continue current medications.   Gout . Pharmacist Clinical Goal(s) o Over the next 180 days, patient will work with PharmD and providers to minimize gout flare-ups.  . Current regimen:  o Colchicine 0.6mg , 1 tablet daily as needed . Interventions: o Eating a low-purine diet. Avoid foods and drinks such as: liver, kidney, anchovies, asparagus, herring, mushrooms, mussels, beer,etc. . Patient self care activities - Over the next 180 days, patient will: o Continue current medications as needed.   GERD . Pharmacist Clinical Goal(s) o Over the next 180 days, patient will work with PharmD and providers to minimize GERD symptoms.  . Current regimen:   Omeprazole 20mg , 1 tablet once daily   Gaviscon as needed . Interventions: o Discussed non-pharmacologic management of symptoms such as elevating the head of your bed, avoiding eating 2-3  hours before bed, avoiding triggering foods such as acidic, spicy, or fatty foods, eating smaller meals, and wearing clothes that are loose around the waist . Patient self care activities, over next 180 days patient will: o Continue current medications  ADHD: . Pharmacist Clinical Goal(s) o Over the next 180 days, patient will continue current medications and follow up with Dr. Marvia Pickles.  . Current regimen:  o Adderall XR 20mg , 1 to 2 capsules every morning  . Patient self care activities-  o Patient will continue current medications.   Depression . Pharmacist Clinical Goal(s) o Over the next 180 days, patient will continue current medications and follow up with Dr. Marvia Pickles.  . Current regimen:  o Sertraline 50mg , 1 tablet once daily  . Patient self care activities o Patient will continue current medications.    Medication management . Pharmacist Clinical Goal(s): o Over the next 180 days, patient will work with PharmD and providers to maintain optimal medication adherence . Current [facility]: [facility] . Interventions o Comprehensive medication review performed. o Continue current medication management strategy . Patient self care activities - Over the next 180 days, patient will: o Take medications as prescribed o Report any questions or concerns to PharmD  and/or provider(s)  Please see past updates related to this goal by clicking on the "Past Updates" button in the selected goal           Hypertension    Office blood pressures are  BP Readings from Last 3 Encounters:  01/02/19 (!) 150/80  10/24/18 140/90  02/21/18 138/88   Patient was these meds in the past: triamterene/ HCTZ (can exacerbate gout)   Patient checks BP at home several times per month  Patient home BP readings are ranging: unable to provide exact readings; states <140/90.   Patient is controlled on:   No medications   We discussed diet and exercise extensively  . Discussed diet  modifications. DASH diet:  following a diet emphasizing fruits and vegetables and low-fat dairy products along with whole grains, fish, poultry, and nuts. Reducing red meats and sugars.  Caution HCTZ in setting of gout.  Plan Continue control with diet and exercise.    Hyperlipidemia  LDL goal < 100  Lipid Panel     Component Value Date/Time   CHOL 209 (H) 05/31/2019 0848   TRIG 196.0 (H) 05/31/2019 0848   HDL 47.30 05/31/2019 0848   CHOLHDL 4 05/31/2019 0848   VLDL 39.2 05/31/2019 0848   LDLCALC 123 (H) 05/31/2019 0848   LDLDIRECT 137.0 01/02/2019 0830    The 10-year ASCVD risk score Mikey Bussing DC Jr., et al., 2013) is: 14.6%   Values used to calculate the score:     Age: 3 years     Sex: Female     Is Non-Hispanic African American: No     Diabetic: No     Tobacco smoker: No     Systolic Blood Pressure: 599 mmHg     Is BP treated: No     HDL Cholesterol: 47.3 mg/dL     Total Cholesterol: 209 mg/dL   Patient has failed these meds in past: statins (intolerant due to joints ache), Zetia (joint pain)  Patient is currently uncontrolled on the following medications:   Fenofibrate 145mg , 1 tablet once daily   We discussed:  diet and exercise extensively  -Lowering cholesterol through diet by: Marland Kitchen Limiting foods with cholesterol such as liver and other organ meats, egg yolks, shrimp, and whole milk dairy products . Avoiding saturated fats and trans fats and incorporating healthier fats, such as lean meat, nuts, and unsaturated oils like canola and olive oils . Eating foods with soluble fiber such as whole-grain cereals such as oatmeal and oat bran, fruits such as apples, bananas, oranges, pears, and prunes, legumes such as kidney beans, lentils, chick peas, black-eyed peas, and lima beans, and green leafy vegetables . Limiting alcohol intake -Exercise: tries to walk every day in the morning  Plan  Continue current medications  Hypothyroidism   TSH  Date Value Ref Range  Status  01/02/2019 1.81 0.35 - 4.50 uIU/mL Final    Patient has failed these meds in past: none  Patient is currently controlled on the following medications:   Synthroid 68mcg, 1 tablet once daily before breakfast   Plan Continue current medications   Gout   Patient denies recent gout flare-ups. She reports not needed to take colchicine in over a year. She reports eating liver every once in a while.  Lab Results  Component Value Date   LABURIC 6.1 10/27/2017    Patient is currently controlled on the following medications:   Colchicine 0.6mg , 1 tablet daily as needed  We discussed:  Eating a low-purine diet. Avoid  foods and drinks such as: liver, kidney, anchovies, asparagus, herring, mushrooms, mussels, beer,etc.  Plan Continue current medications.  Recommend repeat uric acid level.  GERD  Patient endorses nighttime reflux. She stated avoiding food triggers and has been sleeping propped up in bed.    Patient has failed these meds in past: ranitidine  Patient is currently uncontrolled on the following medications:   Omeprazole 40mg , 1 tablet once daily   Gaviscon as needed (patient reports taking if GERD wakes her up)  - not frequent use  We discussed:  non-pharmacological interventions for acid reflux. Take measures to prevent acid reflux, such as avoiding spicy foods, avoiding caffeine, avoid laying down a few hours after eating, and raising the head of the bed.; patient has cut down on portion size for lunch   Plan Continue current medications.  ADHD  Patient has failed these meds in past: none Patient is currently controlled on the following medications:   Adderall XR 20mg , 1 to 2 capsules every morning   Plan Managed by Marvia Pickles  Continue current medications.   Depression   Patient sees Marvia Pickles (psychiatry).    Patient has failed these meds in past: none Patient is currently controlled on the following medications:   Sertraline 50mg , 1  tablet once daily - restarted 2 days ago  We discussed: more common side effects with SSRIs and safety   Plan Managed by Marvia Pickles - meet every 3 months. Continue current medications  OTC/ supplements/ Misc  Patient is currently on the following:   Cholecalciferol (vitamin D3) 2000 units, 1 capsule once daily  Ibuprofen 200mg , 2 tablets every six hours as needed (patient states taking 1-2 times/week for arthritis on base of thumb)    Vitamin B12 SL 1000 mcg 1 tablet daily  We discussed: Limiting NSAID use and switching to Tylenol if needing every day  Vaccines   Reviewed and discussed patient's vaccination history.    Immunization History  Administered Date(s) Administered  . Fluad Quad(high Dose 65+) 10/26/2018  . Influenza Split 09/28/2012  . Influenza Whole 11/05/2004, 09/18/2008  . Influenza, High Dose Seasonal PF 09/02/2014, 10/13/2015, 11/18/2016, 10/20/2017, 10/29/2019  . Influenza-Unspecified 10/18/2013  . PFIZER SARS-COV-2 Vaccination 08/18/2019, 09/08/2019  . Pneumococcal Conjugate-13 05/05/2015  . Pneumococcal Polysaccharide-23 03/21/2007, 02/11/2017  . Td 01/19/2003  . Tdap 05/05/2015  . Zoster 03/27/2013    Plan  Recommended patient receive shingles vaccine at pharmacy.    Medication Management   [facility] for [facility] medications Uses pill box? Yes - fills it up for 2 weeks in advance Pt endorses 90% compliance  We discussed: Current pharmacy is preferred with insurance plan and patient is satisfied with pharmacy services  Plan  Continue current medication management strategy   Follow up: 6 month phone visit CPA 3 month BP assessment  Jeni Salles, PharmD Clinical Pharmacist [place] [facility] [place]

## 2019-12-02 ENCOUNTER — Other Ambulatory Visit: Payer: Self-pay | Admitting: Adult Health

## 2019-12-02 DIAGNOSIS — F339 Major depressive disorder, recurrent, unspecified: Secondary | ICD-10-CM

## 2019-12-31 NOTE — Progress Notes (Signed)
Subjective:   Diana [name] is a 69 y.o. female who presents for Medicare Annual (Subsequent) preventive examination.  Virtual Visit via Video Note  I connected with [name] on 01/01/20 at  2:45 PM EST by a video enabled telemedicine application and verified that I am speaking with the correct person using two identifiers.  Location: Patient: Home  Provider: Office   I discussed the limitations of evaluation and management by telemedicine and the availability of in person appointments. The patient expressed understanding and agreed to proceed.           Ofilia Neas, LPN    Review of Systems    N/A  Cardiac Risk Factors include: advanced age (>74men, >52 women);hypertension     Objective:    Today's Vitals   There is no height or weight on file to calculate BMI.  Advanced Directives 01/01/2020 10/16/2015 04/01/2015 10/23/2014 10/21/2014  Does Patient Have a Medical Advance Directive? Yes Yes Yes - Yes  Type of Advance Directive [place];Living will [place];Living will [place];Living will - Living will;Healthcare Power of Attorney  Does patient want to make changes to medical advance directive? No - Patient declined No - Patient declined - - No - Patient declined  Copy of [place] in Chart? No - copy requested Yes - No - copy requested No - copy requested    Current Medications (verified) Outpatient Encounter Medications as of 01/01/2020  Medication Sig  . Alum Hydroxide-Mag Carbonate (GAVISCON PO) Take by mouth as needed.   Marland Kitchen amphetamine-dextroamphetamine (ADDERALL XR) 20 MG 24 hr capsule TAKE 1 TO 2 CAPSULES BY MOUTH EVERY MORNING  . Ascorbic Acid (VITAMIN C WITH ROSE HIPS) 500 MG tablet Take 500 mg by mouth daily.  . Cholecalciferol (VITAMIN D3) 50 MCG (2000 UT) TABS Take 1 capsule by mouth daily.   . fenofibrate (TRICOR) 145 MG tablet TAKE 1 TABLET BY MOUTH EVERY DAY  .  ibuprofen (ADVIL,MOTRIN) 200 MG tablet Take 400 mg by mouth every 6 (six) hours as needed.  . Multiple Vitamins-Minerals (MULTIVITAMIN ADULTS PO) Take by mouth daily.  . Multiple Vitamins-Minerals (ZINC PO) Take 20 mg by mouth daily.  . Omega-3 Fatty Acids (OMEGA 3 PO) Take 1 capsule by mouth daily.  Marland Kitchen omeprazole (PRILOSEC) 40 MG capsule Take 1 capsule (40 mg total) by mouth daily.  . sertraline (ZOLOFT) 50 MG tablet Take 50 mg by mouth daily.  Marland Kitchen SYNTHROID 50 MCG tablet TAKE 1 TABLET BY MOUTH DAILY BEFORE BREAKFAST  . colchicine 0.6 MG tablet TAKE 1 TABLET (0.6 MG TOTAL) BY MOUTH DAILY AS NEEDED. (Patient not taking: Reported on 01/01/2020)  . [DISCONTINUED] famotidine (PEPCID) 20 MG tablet Take 20 mg by mouth 2 (two) times daily. (Patient not taking: Reported on 01/01/2020)  . [DISCONTINUED] sertraline (ZOLOFT) 25 MG tablet TAKE 1 TABLET BY MOUTH EVERY DAY   No facility-administered encounter medications on file as of 01/01/2020.    Allergies (verified) Ezetimibe, Latex, and Morphine sulfate   History: Past Medical History:  Diagnosis Date  . ADD (attention deficit disorder)   . Anxiety   . Cancer ([place])    skin  . Depression   . Diverticulosis   . DJD (degenerative joint disease)   . GERD (gastroesophageal reflux disease)   . History of hiatal hernia   . Hypertension   . Mood disorder ([place])   . SCC (squamous cell carcinoma) in situ 04/22/2010   right  forearm  . STD (sexually transmitted disease)    HSV II   Past Surgical History:  Procedure Laterality Date  . ABDOMINAL HYSTERECTOMY     partial  . BREAST IMPLANT REMOVAL    . CESAREAN SECTION    . COLON SURGERY     had about 1 foot of bowel removed   . HEMORRHOID SURGERY    . LIPOMA EXCISION N/A 10/23/2014   Procedure: EXCISION OF BACK LIPOMA;  Surgeon: Donnie Mesa, MD;  Location: [facility];  Service: General;  Laterality: N/A;  . PARTIAL COLECTOMY    . PARTIAL HYSTERECTOMY     still has ovaries   . PILONIDAL CYST  EXCISION    . PLACEMENT OF BREAST IMPLANTS    . RHINOPLASTY     x 2  . SHOULDER SURGERY Right    06   Family History  Problem Relation Age of Onset  . Melanoma Mother   . Kidney cancer Father   . Breast cancer Paternal Grandmother   . Cancer Maternal Grandmother        possible uterine?    Social History   Socioeconomic History  . Marital status: Married    Spouse name: Not on file  . Number of children: 1  . Years of education: Not on file  . Highest education level: Not on file  Occupational History  . Not on file  Tobacco Use  . Smoking status: Former Smoker    Packs/day: 1.00    Years: 20.00    Pack years: 20.00    Types: Cigarettes    Quit date: 06/29/2006    Years since quitting: 13.5  . Smokeless tobacco: Never Used  Vaping Use  . Vaping Use: Never used  Substance and Sexual Activity  . Alcohol use: No    Alcohol/week: 0.0 standard drinks    Comment: occ beer  . Drug use: No  . Sexual activity: Not Currently    Birth control/protection: Post-menopausal  Other Topics Concern  . Not on file  Social History Narrative  . Not on file   Social Determinants of Health   Financial Resource Strain: Low Risk   . Difficulty of Paying Living Expenses: Not hard at all  Food Insecurity: No Food Insecurity  . Worried About Charity fundraiser in the Last Year: Never true  . Ran Out of Food in the Last Year: Never true  Transportation Needs: No Transportation Needs  . Lack of Transportation (Medical): No  . Lack of Transportation (Non-Medical): No  Physical Activity: Inactive  . Days of Exercise per Week: 0 days  . Minutes of Exercise per Session: 0 min  Stress: No Stress Concern Present  . Feeling of Stress : Not at all  Social Connections: Socially Integrated  . Frequency of Communication with Friends and Family: More than three times a week  . Frequency of Social Gatherings with Friends and Family: More than three times a week  . Attends Religious Services:  More than 4 times per year  . Active Member of Clubs or Organizations: Yes  . Attends Archivist Meetings: More than 4 times per year  . Marital Status: Married    Tobacco Counseling Counseling given: Not Answered   Clinical Intake:  Pre-visit preparation completed: Yes  Pain : No/denies pain     Nutritional Risks: None Diabetes: No  How often do you need to have someone help you when you read instructions, pamphlets, or other written materials from your doctor or  pharmacy?: 1 - Never What is the last grade level you completed in school?: Some College  Diabetic? No   Interpreter Needed?: No  Information entered by :: [place] of Daily Living In your present state of health, do you have any difficulty performing the following activities: 01/01/2020  Hearing? N  Vision? N  Difficulty concentrating or making decisions? N  Walking or climbing stairs? N  Dressing or bathing? N  Doing errands, shopping? N  Preparing Food and eating ? N  Using the Toilet? N  In the past six months, have you accidently leaked urine? Y  Comment has occassional bladder leakage with coughing, lauging or sneezing  Do you have problems with loss of bowel control? N  Managing your Medications? N  Managing your Finances? N  Housekeeping or managing your Housekeeping? N  Some recent data might be hidden    Patient Care Team: Dorothyann Peng, NP as PCP - General (Family Medicine) Roseanne Kaufman, MD as Consulting Physician (Orthopedic Surgery) Viona Gilmore, [facility] as Pharmacist (Pharmacist)  Indicate any recent Medical Services you may have received from other than [facility] providers in the past year (date may be approximate).     Assessment:   This is a routine wellness examination for Ron[place]a.  Hearing/Vision screen  Hearing Screening   125Hz  250Hz  500Hz  1000Hz  2000Hz  3000Hz  4000Hz  6000Hz  8000Hz   Right ear:           Left ear:           Vision Screening  Comments: Patient states gets eyes examined every 2 years.  Dietary issues and exercise activities discussed: Current Exercise Habits: The patient does not participate in regular exercise at present, Exercise limited by: None identified  Goals    . Exercise 150 min/wk Moderate Activity    . Pharmacy Care Plan     CARE PLAN ENTRY  Current Barriers:  . Chronic Disease Management support, education, and care coordination needs related to Hypertension, Hyperlipidemia, Gastroesophageal Reflux Disease, Hypothyroidism, Depression, and Gout, ADHD   Hypertension . Pharmacist Clinical Goal(s): o Over the next 180 days, patient will work with PharmD and providers to maintain BP goal <130/80 . Current regimen:  o No medications (lifestyle modifications) . Interventions: o Discussed diet modifications. DASH diet:  following a diet emphasizing fruits and vegetables and low-fat dairy products along with whole grains, fish, poultry, and nuts. Reducing red meats and sugars . Patient self care activities - Over the next 180 days, patient will: o Check blood pressure a few times per month, document, and provide at future appointments  Hyperlipidemia . Pharmacist Clinical Goal(s): o Over the next 180 days, patient will work with PharmD and providers to achieve LDL goal < 100 . Current regimen:  o Fenofibrate 145mg , 1 tablet once daily  . Interventions: . Lowering cholesterol through diet by: Marland Kitchen Limiting foods with cholesterol such as liver and other organ meats, egg yolks, shrimp, and whole milk dairy products . Avoiding saturated fats and trans fats and incorporating healthier fats, such as lean meat, nuts, and unsaturated oils like canola and olive oils . Eating foods with soluble fiber such as whole-grain cereals such as oatmeal and oat bran, fruits such as apples, bananas, oranges, pears, and prunes, legumes such as kidney beans, lentils, chick peas, black-eyed peas, and lima beans, and green leafy  vegetables . Limiting alcohol intake . Patient self care activities - Over the next 180 days, patient will: o Continue working on lifestyle and diet  modifications to lower cholesterol  Hypothyroidism . Pharmacist Clinical Goal(s): o Over the next 180 days, patient will work with PharmD and providers to maintain TSH between 0.45 to 4.5uIU/ml . Current regimen:  o Synthroid 4mcg, 1 tablet once daily before breakfast  . Patient self care activities - Over the next 180 days, patient will: o Continue current medications.   Gout . Pharmacist Clinical Goal(s) o Over the next 180 days, patient will work with PharmD and providers to minimize gout flare-ups.  . Current regimen:  o Colchicine 0.6mg , 1 tablet daily as needed . Interventions: o Eating a low-purine diet. Avoid foods and drinks such as: liver, kidney, anchovies, asparagus, herring, mushrooms, mussels, beer,etc. . Patient self care activities - Over the next 180 days, patient will: o Continue current medications as needed.   GERD . Pharmacist Clinical Goal(s) o Over the next 180 days, patient will work with PharmD and providers to minimize GERD symptoms.  . Current regimen:   Omeprazole 20mg , 1 tablet once daily   Gaviscon as needed . Interventions: o Discussed non-pharmacologic management of symptoms such as elevating the head of your bed, avoiding eating 2-3 hours before bed, avoiding triggering foods such as acidic, spicy, or fatty foods, eating smaller meals, and wearing clothes that are loose around the waist . Patient self care activities, over next 180 days patient will: o Continue current medications  ADHD: . Pharmacist Clinical Goal(s) o Over the next 180 days, patient will continue current medications and follow up with Dr. Marvia Pickles.  . Current regimen:  o Adderall XR 20mg , 1 to 2 capsules every morning  . Patient self care activities-  o Patient will continue current medications.    Depression . Pharmacist Clinical Goal(s) o Over the next 180 days, patient will continue current medications and follow up with Dr. Marvia Pickles.  . Current regimen:  o Sertraline 50mg , 1 tablet once daily  . Patient self care activities o Patient will continue current medications.    Medication management . Pharmacist Clinical Goal(s): o Over the next 180 days, patient will work with PharmD and providers to maintain optimal medication adherence . Current pharmacy: [facility] . Interventions o Comprehensive medication review performed. o Continue current medication management strategy . Patient self care activities - Over the next 180 days, patient will: o Take medications as prescribed o Report any questions or concerns to PharmD and/or provider(s)  Please see past updates related to this goal by clicking on the "Past Updates" button in the selected goal        Depression Screen PHQ 2/9 Scores 01/01/2020 10/27/2017 09/02/2014  PHQ - 2 Score 0 0 0  PHQ- 9 Score 0 - -    Fall Risk Fall Risk  01/01/2020 10/27/2017 09/02/2014  Falls in the past year? 0 No No  Number falls in past yr: 0 - -  Injury with Fall? 0 - -  Risk for fall due to : No Fall Risks - -  Follow up Falls evaluation completed;Falls prevention discussed - -    FALL RISK PREVENTION PERTAINING TO THE HOME:  Any stairs in or around the home? Yes  If so, are there any without handrails? No  Home free of loose throw rugs in walkways, pet beds, electrical cords, etc? Yes  Adequate lighting in your home to reduce risk of falls? Yes   ASSISTIVE DEVICES UTILIZED TO PREVENT FALLS:  Life alert? No  Use of a cane, walker or w/c? No  Grab bars in  the bathroom? Yes  Shower chair or bench in shower? Yes  Elevated toilet seat or a handicapped toilet? No   Cognitive Function:   Normal cognitive status assessed by direct observation by this Nurse Health Advisor. No abnormalities found.         Immunizations Immunization History  Administered Date(s) Administered  . Fluad Quad(high Dose 65+) 10/26/2018  . Influenza Split 09/28/2012  . Influenza Whole 11/05/2004, 09/18/2008  . Influenza, High Dose Seasonal PF 09/02/2014, 10/13/2015, 11/18/2016, 10/20/2017, 10/29/2019  . Influenza-Unspecified 10/18/2013  . PFIZER SARS-COV-2 Vaccination 08/18/2019, 09/08/2019  . Pneumococcal Conjugate-13 05/05/2015  . Pneumococcal Polysaccharide-23 03/21/2007, 02/11/2017  . Td 01/19/2003  . Tdap 05/05/2015  . Zoster 03/27/2013    TDAP status: Up to date  Flu Vaccine status: Up to date  Pneumococcal vaccine status: Up to date  Covid-19 vaccine status: Completed vaccines  Qualifies for Shingles Vaccine? Yes   Zostavax completed Yes   Shingrix Completed?: No.    Education has been provided regarding the importance of this vaccine. Patient has been advised to call insurance company to determine out of pocket expense if they have not yet received this vaccine. Advised may also receive vaccine at local pharmacy or Health Dept. Verbalized acceptance and understanding.  Screening Tests Health Maintenance  Topic Date Due  . COVID-19 Vaccine (3 - Pfizer risk 4-dose series) 10/06/2019  . MAMMOGRAM  12/26/2019  . COLONOSCOPY  03/31/2025  . TETANUS/TDAP  05/04/2025  . INFLUENZA VACCINE  Completed  . DEXA SCAN  Completed  . Hepatitis C Screening  Completed  . PNA vac Low Risk Adult  Completed    Health Maintenance  Health Maintenance Due  Topic Date Due  . COVID-19 Vaccine (3 - Pfizer risk 4-dose series) 10/06/2019  . MAMMOGRAM  12/26/2019    Colorectal cancer screening: Type of screening: Colonoscopy. Completed 04/01/2015. Repeat every 10 years  Mammogram status: Ordered 01/01/2020. Pt provided with contact info and advised to call to schedule appt.   Bone Density status: Completed 09/28/2016. Results reflect: Bone density results: OSTEOPENIA. Repeat every 5 years.  Lung Cancer  Screening: (Low Dose CT Chest recommended if Age 46-80 years, 30 pack-year currently smoking OR have quit w/in 15years.) does not qualify.   Lung Cancer Screening Referral: N/A   Additional Screening:  Hepatitis C Screening: does qualify; Completed 10/24/2018   Vision Screening: Recommended annual ophthalmology exams for early detection of glaucoma and other disorders of the eye. Is the patient up to date with their annual eye exam?  Yes  Who is the provider or what is the name of the office in which the patient attends annual eye exams? Dr. Mariane Duval  If pt is not established with a provider, would they like to be referred to a provider to establish care? No .   Dental Screening: Recommended annual dental exams for proper oral hygiene  Community Resource Referral / Chronic Care Management: CRR required this visit?  No   CCM required this visit?  No      Plan:     I have personally reviewed and noted the following in the patient's chart:   . Medical and social history . Use of alcohol, tobacco or illicit drugs  . Current medications and supplements . Functional ability and status . Nutritional status . Physical activity . Advanced directives . List of other physicians . Hospitalizations, surgeries, and ER visits in previous 12 months . Vitals . Screenings to include cognitive, depression, and falls . Referrals and appointments  In addition, I have reviewed and discussed with patient certain preventive protocols, quality metrics, and best practice recommendations. A written personalized care plan for preventive services as well as general preventive health recommendations were provided to patient.     Ofilia Neas, LPN   [id number]   Nurse Notes: None

## 2020-01-01 ENCOUNTER — Ambulatory Visit (INDEPENDENT_AMBULATORY_CARE_PROVIDER_SITE_OTHER): Payer: PPO

## 2020-01-01 ENCOUNTER — Other Ambulatory Visit: Payer: Self-pay

## 2020-01-01 DIAGNOSIS — Z0000 Encounter for general adult medical examination without abnormal findings: Secondary | ICD-10-CM | POA: Diagnosis not present

## 2020-01-01 DIAGNOSIS — Z1231 Encounter for screening mammogram for malignant neoplasm of breast: Secondary | ICD-10-CM | POA: Diagnosis not present

## 2020-01-01 NOTE — Patient Instructions (Signed)
Diana [name] , Thank you for taking time to come for your Medicare Wellness Visit. I appreciate your ongoing commitment to your health goals. Please review the following plan we discussed and let me know if I can assist you in the future.   Screening recommendations/referrals: Colonoscopy: Up to date, next due 03/31/2025 Mammogram: Currently due orders placed this visit  Bone Density: Up to date, next due 09/28/2021 Recommended yearly ophthalmology/optometry visit for glaucoma screening and checkup Recommended yearly dental visit for hygiene and checkup  Vaccinations: Influenza vaccine: Up to date, next due fall 2022  Pneumococcal vaccine: Completed series  Tdap vaccine: Up to date, next due 05/04/2025 Shingles vaccine: Currently due for Shingirx, if you wish to receive we recommend that you do so at your local pharmacy as it is less expensive     Advanced directives: Please bring copies of your advanced medical directives into our office so that we may scan them into your chart.   Conditions/risks identified: None   Next appointment: 01/10/2020 @ 10:30 am with Dorothyann Peng, NP   Preventive Care 11 Years and Older, Female Preventive care refers to lifestyle choices and visits with your health care provider that can promote health and wellness. What does preventive care include?  A yearly physical exam. This is also called an annual well check.  Dental exams once or twice a year.  Routine eye exams. Ask your health care provider how often you should have your eyes checked.  Personal lifestyle choices, including:  Daily care of your teeth and gums.  Regular physical activity.  Eating a healthy diet.  Avoiding tobacco and drug use.  Limiting alcohol use.  Practicing safe sex.  Taking low-dose aspirin every day.  Taking vitamin and mineral supplements as recommended by your health care provider. What happens during an annual well check? The services and screenings done  by your health care provider during your annual well check will depend on your age, overall health, lifestyle risk factors, and family history of disease. Counseling  Your health care provider may ask you questions about your:  Alcohol use.  Tobacco use.  Drug use.  Emotional well-being.  Home and relationship well-being.  Sexual activity.  Eating habits.  History of falls.  Memory and ability to understand (cognition).  Work and work Statistician.  Reproductive health. Screening  You may have the following tests or measurements:  Height, weight, and BMI.  Blood pressure.  Lipid and cholesterol levels. These may be checked every 5 years, or more frequently if you are over 79 years old.  Skin check.  Lung cancer screening. You may have this screening every year starting at age 73 if you have a 30-pack-year history of smoking and currently smoke or have quit within the past 15 years.  Fecal occult blood test (FOBT) of the stool. You may have this test every year starting at age 27.  Flexible sigmoidoscopy or colonoscopy. You may have a sigmoidoscopy every 5 years or a colonoscopy every 10 years starting at age 64.  Hepatitis C blood test.  Hepatitis B blood test.  Sexually transmitted disease (STD) testing.  Diabetes screening. This is done by checking your blood sugar (glucose) after you have not eaten for a while (fasting). You may have this done every 1-3 years.  Bone density scan. This is done to screen for osteoporosis. You may have this done starting at age 57.  Mammogram. This may be done every 1-2 years. Talk to your health care provider about  how often you should have regular mammograms. Talk with your health care provider about your test results, treatment options, and if necessary, the need for more tests. Vaccines  Your health care provider may recommend certain vaccines, such as:  Influenza vaccine. This is recommended every year.  Tetanus,  diphtheria, and acellular pertussis (Tdap, Td) vaccine. You may need a Td booster every 10 years.  Zoster vaccine. You may need this after age 22.  Pneumococcal 13-valent conjugate (PCV13) vaccine. One dose is recommended after age 84.  Pneumococcal polysaccharide (PPSV23) vaccine. One dose is recommended after age 27. Talk to your health care provider about which screenings and vaccines you need and how often you need them. This information is not intended to replace advice given to you by your health care provider. Make sure you discuss any questions you have with your health care provider. Document Released: 01/31/2015 Document Revised: 09/24/2015 Document Reviewed: 11/05/2014 Elsevier Interactive Patient Education  2017 [employer] Prevention in the Home Falls can cause injuries. They can happen to people of all ages. There are many things you can do to make your home safe and to help prevent falls. What can I do on the outside of my home?  Regularly fix the edges of walkways and driveways and fix any cracks.  Remove anything that might make you trip as you walk through a door, such as a raised step or threshold.  Trim any bushes or trees on the path to your home.  Use bright outdoor lighting.  Clear any walking paths of anything that might make someone trip, such as rocks or tools.  Regularly check to see if handrails are loose or broken. Make sure that both sides of any steps have handrails.  Any raised decks and porches should have guardrails on the edges.  Have any leaves, snow, or ice cleared regularly.  Use sand or salt on walking paths during winter.  Clean up any spills in your garage right away. This includes oil or grease spills. What can I do in the bathroom?  Use night lights.  Install grab bars by the toilet and in the tub and shower. Do not use towel bars as grab bars.  Use non-skid mats or decals in the tub or shower.  If you need to sit down in  the shower, use a plastic, non-slip stool.  Keep the floor dry. Clean up any water that spills on the floor as soon as it happens.  Remove soap buildup in the tub or shower regularly.  Attach bath mats securely with double-sided non-slip rug tape.  Do not have throw rugs and other things on the floor that can make you trip. What can I do in the bedroom?  Use night lights.  Make sure that you have a light by your bed that is easy to reach.  Do not use any sheets or blankets that are too big for your bed. They should not hang down onto the floor.  Have a firm chair that has side arms. You can use this for support while you get dressed.  Do not have throw rugs and other things on the floor that can make you trip. What can I do in the kitchen?  Clean up any spills right away.  Avoid walking on wet floors.  Keep items that you use a lot in easy-to-reach places.  If you need to reach something above you, use a strong step stool that has a grab bar.  Keep  electrical cords out of the way.  Do not use floor polish or wax that makes floors slippery. If you must use wax, use non-skid floor wax.  Do not have throw rugs and other things on the floor that can make you trip. What can I do with my stairs?  Do not leave any items on the stairs.  Make sure that there are handrails on both sides of the stairs and use them. Fix handrails that are broken or loose. Make sure that handrails are as long as the stairways.  Check any carpeting to make sure that it is firmly attached to the stairs. Fix any carpet that is loose or worn.  Avoid having throw rugs at the top or bottom of the stairs. If you do have throw rugs, attach them to the floor with carpet tape.  Make sure that you have a light switch at the top of the stairs and the bottom of the stairs. If you do not have them, ask someone to add them for you. What else can I do to help prevent falls?  Wear shoes that:  Do not have high  heels.  Have rubber bottoms.  Are comfortable and fit you well.  Are closed at the toe. Do not wear sandals.  If you use a stepladder:  Make sure that it is fully opened. Do not climb a closed stepladder.  Make sure that both sides of the stepladder are locked into place.  Ask someone to hold it for you, if possible.  Clearly mark and make sure that you can see:  Any grab bars or handrails.  First and last steps.  Where the edge of each step is.  Use tools that help you move around (mobility aids) if they are needed. These include:  Canes.  Walkers.  Scooters.  Crutches.  Turn on the lights when you go into a dark area. Replace any light bulbs as soon as they burn out.  Set up your furniture so you have a clear path. Avoid moving your furniture around.  If any of your floors are uneven, fix them.  If there are any pets around you, be aware of where they are.  Review your medicines with your doctor. Some medicines can make you feel dizzy. This can increase your chance of falling. Ask your doctor what other things that you can do to help prevent falls. This information is not intended to replace advice given to you by your health care provider. Make sure you discuss any questions you have with your health care provider. Document Released: 10/31/2008 Document Revised: 06/12/2015 Document Reviewed: 02/08/2014 Elsevier Interactive Patient Education  2017 [employer].

## 2020-01-03 ENCOUNTER — Other Ambulatory Visit: Payer: Self-pay | Admitting: Adult Health

## 2020-01-10 ENCOUNTER — Encounter: Payer: Self-pay | Admitting: Adult Health

## 2020-01-10 ENCOUNTER — Ambulatory Visit (INDEPENDENT_AMBULATORY_CARE_PROVIDER_SITE_OTHER): Payer: PPO | Admitting: Adult Health

## 2020-01-10 ENCOUNTER — Other Ambulatory Visit: Payer: Self-pay

## 2020-01-10 VITALS — BP 150/80 | HR 72 | Temp 98.6°F | Ht 59.0 in | Wt 135.6 lb

## 2020-01-10 DIAGNOSIS — F339 Major depressive disorder, recurrent, unspecified: Secondary | ICD-10-CM

## 2020-01-10 DIAGNOSIS — E039 Hypothyroidism, unspecified: Secondary | ICD-10-CM

## 2020-01-10 DIAGNOSIS — F902 Attention-deficit hyperactivity disorder, combined type: Secondary | ICD-10-CM | POA: Diagnosis not present

## 2020-01-10 DIAGNOSIS — Z0000 Encounter for general adult medical examination without abnormal findings: Secondary | ICD-10-CM | POA: Diagnosis not present

## 2020-01-10 DIAGNOSIS — E7800 Pure hypercholesterolemia, unspecified: Secondary | ICD-10-CM | POA: Diagnosis not present

## 2020-01-10 DIAGNOSIS — I10 Essential (primary) hypertension: Secondary | ICD-10-CM | POA: Diagnosis not present

## 2020-01-10 NOTE — Patient Instructions (Signed)
It was great seeing you today   Please sent me your blood pressure readings in two weeks   I will follow up with you regarding your blood work

## 2020-01-10 NOTE — Progress Notes (Signed)
Subjective:    Patient ID: Diana [name], female    DOB: November 02, 1950, 69 y.o.   MRN: [id number]  HPI Patient presents for yearly preventative medicine examination. She is a very pleasant 69 year old female who  has a past medical history of ADD (attention deficit disorder), Anxiety, Cancer ([place]), Depression, Diverticulosis, DJD (degenerative joint disease), GERD (gastroesophageal reflux disease), History of hiatal hernia, Hypertension, Mood disorder ([place]), SCC (squamous cell carcinoma) in situ (04/22/2010), and STD (sexually transmitted disease).  Hyperlipidemia -currently prescribed TriCor 145 mg daily.  She has been intolerant to many statins in the past.  She denies myalgia or fatigue  Lab Results  Component Value Date   CHOL 209 (H) 05/31/2019   HDL 47.30 05/31/2019   LDLCALC 123 (H) 05/31/2019   LDLDIRECT 137.0 01/02/2019   TRIG 196.0 (H) 05/31/2019   CHOLHDL 4 05/31/2019   ADHD -managed by psychiatry.  Currently prescribed Adderall 40 mg daily.  Feels well controlled on this medication  Hypertension - Was on dyazide in the past but has not taken this medication in many years. She does check periodically at home and has been " normal" in the past.  She has not checked recently.  Denies aches, dizziness, chest pain, shortness of breath BP Readings from Last 3 Encounters:  01/10/20 (!) 150/80  01/02/19 (!) 150/80  10/24/18 140/90     Hypothyroidism -currently controlled with Synthroid 50 mcg daily. Feels well controlled.  Lab Results  Component Value Date   TSH 1.81 01/02/2019   Depression -managed by psychiatry.  Currently on Zoloft 50 mg and feels well controlled  All immunizations and health maintenance protocols were reviewed with the patient and needed orders were placed.  Appropriate screening laboratory values were ordered for the patient including screening of hyperlipidemia, renal function and hepatic function. If indicated by BPH, a PSA was  ordered.  Medication reconciliation,  past medical history, social history, problem list and allergies were reviewed in detail with the patient  Goals were established with regard to weight loss, exercise, and  diet in compliance with medications.  She does stay very active and enjoys exercising as well as eating healthy Wt Readings from Last 3 Encounters:  01/10/20 135 lb 9.6 oz (61.5 kg)  01/02/19 138 lb (62.6 kg)  10/24/18 130 lb (59 kg)   He is up-to-date on routine colon cancer screening well his mammograms.  She has no acute issues today   Review of Systems  Constitutional: Negative.   HENT: Negative.   Eyes: Negative.   Respiratory: Negative.   Cardiovascular: Negative.   Gastrointestinal: Negative.   Endocrine: Negative.   Genitourinary: Negative.   Musculoskeletal: Negative.   Skin: Negative.   Allergic/Immunologic: Negative.   Neurological: Negative.   Hematological: Negative.   Psychiatric/Behavioral: Negative.    Past Medical History:  Diagnosis Date   ADD (attention deficit disorder)    Anxiety    Cancer ([place])    skin   Depression    Diverticulosis    DJD (degenerative joint disease)    GERD (gastroesophageal reflux disease)    History of hiatal hernia    Hypertension    Mood disorder ([facility])    SCC (squamous cell carcinoma) in situ 04/22/2010   right forearm   STD (sexually transmitted disease)    HSV II    Social History   Socioeconomic History   Marital status: Married    Spouse name: Not on file   Number of children: 1  Years of education: Not on file   Highest education level: Not on file  Occupational History   Not on file  Tobacco Use   Smoking status: Former Smoker    Packs/day: 1.00    Years: 20.00    Pack years: 20.00    Types: Cigarettes    Quit date: 06/29/2006    Years since quitting: 13.5   Smokeless tobacco: Never Used  Vaping Use   Vaping Use: Never used  Substance and Sexual Activity   Alcohol  use: No    Alcohol/week: 0.0 standard drinks    Comment: occ beer   Drug use: No   Sexual activity: Not Currently    Birth control/protection: Post-menopausal  Other Topics Concern   Not on file  Social History Narrative   Not on file   Social Determinants of Health   Financial Resource Strain: Low Risk    Difficulty of Paying Living Expenses: Not hard at all  Food Insecurity: No Food Insecurity   Worried About Charity fundraiser in the Last Year: Never true   [place] in the Last Year: Never true  Transportation Needs: No Transportation Needs   Lack of Transportation (Medical): No   Lack of Transportation (Non-Medical): No  Physical Activity: Inactive   Days of Exercise per Week: 0 days   Minutes of Exercise per Session: 0 min  Stress: No Stress Concern Present   Feeling of Stress : Not at all  Social Connections: Socially Integrated   Frequency of Communication with Friends and Family: More than three times a week   Frequency of Social Gatherings with Friends and Family: More than three times a week   Attends Religious Services: More than 4 times per year   Active Member of [employer] or Organizations: Yes   Attends Music therapist: More than 4 times per year   Marital Status: Married  Human resources officer Violence: Not At Risk   Fear of Current or Ex-Partner: No   Emotionally Abused: No   Physically Abused: No   Sexually Abused: No    Past Surgical History:  Procedure Laterality Date   ABDOMINAL HYSTERECTOMY     partial   BREAST IMPLANT REMOVAL     CESAREAN SECTION     COLON SURGERY     had about 1 foot of bowel removed    [place] N/A 10/23/2014   Procedure: EXCISION OF BACK LIPOMA;  Surgeon: Donnie Mesa, MD;  Location: [facility];  Service: General;  Laterality: N/A;   PARTIAL COLECTOMY     PARTIAL HYSTERECTOMY     still has ovaries    PILONIDAL CYST EXCISION     PLACEMENT OF BREAST  IMPLANTS     RHINOPLASTY     x 2   SHOULDER SURGERY Right    06    Family History  Problem Relation Age of Onset   Melanoma Mother    Kidney cancer Father    Breast cancer Paternal Grandmother    Cancer Maternal Grandmother        possible uterine?     Allergies  Allergen Reactions   Ezetimibe     REACTION: statins make joints ache   Latex Hives   Morphine Sulfate     REACTION: n \T\ v    Current Outpatient Medications on File Prior to Visit  Medication Sig Dispense Refill   Alum Hydroxide-Mag Carbonate (GAVISCON PO) Take by mouth as needed.  amphetamine-dextroamphetamine (ADDERALL XR) 20 MG 24 hr capsule TAKE 1 TO 2 CAPSULES BY MOUTH EVERY MORNING  0   Ascorbic Acid (VITAMIN C WITH ROSE HIPS) 500 MG tablet Take 500 mg by mouth daily.     Cholecalciferol (VITAMIN D3) 50 MCG (2000 UT) TABS Take 1 capsule by mouth daily.      colchicine 0.6 MG tablet TAKE 1 TABLET (0.6 MG TOTAL) BY MOUTH DAILY AS NEEDED. 30 tablet 0   fenofibrate (TRICOR) 145 MG tablet TAKE 1 TABLET BY MOUTH EVERY DAY 90 tablet 1   ibuprofen (ADVIL,MOTRIN) 200 MG tablet Take 400 mg by mouth every 6 (six) hours as needed.     Multiple Vitamins-Minerals (MULTIVITAMIN ADULTS PO) Take by mouth daily.     Multiple Vitamins-Minerals (ZINC PO) Take 20 mg by mouth daily.     Omega-3 Fatty Acids (OMEGA 3 PO) Take 1 capsule by mouth daily.     omeprazole (PRILOSEC) 40 MG capsule Take 1 capsule (40 mg total) by mouth daily. 90 capsule 3   sertraline (ZOLOFT) 50 MG tablet Take 50 mg by mouth daily.     SYNTHROID 50 MCG tablet TAKE 1 TABLET BY MOUTH DAILY BEFORE BREAKFAST 90 tablet 3   No current facility-administered medications on file prior to visit.    BP (!) 150/80 (BP Location: Left Arm, Patient Position: Sitting, Cuff Size: Normal)    Pulse 72    Temp 98.6 F (37 C) (Oral)    Ht 4' 11"  (1.499 m)    Wt 135 lb 9.6 oz (61.5 kg)    SpO2 99%    BMI 27.39 kg/m       Objective:   Physical  Exam Vitals and nursing note reviewed.  Constitutional:      General: She is not in acute distress.    Appearance: Normal appearance. She is well-developed. She is not ill-appearing.  HENT:     Head: Normocephalic and atraumatic.     Right Ear: Tympanic membrane, ear canal and external ear normal. There is no impacted cerumen.     Left Ear: Tympanic membrane, ear canal and external ear normal. There is no impacted cerumen.     Nose: Nose normal. No congestion or rhinorrhea.     Mouth/Throat:     Mouth: Mucous membranes are moist.     Pharynx: Oropharynx is clear. No oropharyngeal exudate or posterior oropharyngeal erythema.  Eyes:     General:        Right eye: No discharge.        Left eye: No discharge.     Extraocular Movements: Extraocular movements intact.     Conjunctiva/sclera: Conjunctivae normal.     Pupils: Pupils are equal, round, and reactive to light.  Neck:     Thyroid: No thyromegaly.     Vascular: No carotid bruit.     Trachea: No tracheal deviation.  Cardiovascular:     Rate and Rhythm: Normal rate and regular rhythm.     Pulses: Normal pulses.     Heart sounds: Normal heart sounds. No murmur heard. No friction rub. No gallop.   Pulmonary:     Effort: Pulmonary effort is normal. No respiratory distress.     Breath sounds: Normal breath sounds. No stridor. No wheezing, rhonchi or rales.  Chest:     Chest wall: No tenderness.  Abdominal:     General: Abdomen is flat. Bowel sounds are normal. There is no distension.     Palpations: Abdomen is soft. There  is no mass.     Tenderness: There is no abdominal tenderness. There is no right CVA tenderness, left CVA tenderness, guarding or rebound.     Hernia: No hernia is present.  Musculoskeletal:        General: No swelling, tenderness, deformity or signs of injury. Normal range of motion.     Cervical back: Normal range of motion and neck supple.     Right lower leg: No edema.     Left lower leg: No edema.   Lymphadenopathy:     Cervical: No cervical adenopathy.  Skin:    General: Skin is warm and dry.     Coloration: Skin is not jaundiced or pale.     Findings: No bruising, erythema, lesion or rash.  Neurological:     General: No focal deficit present.     Mental Status: She is alert and oriented to person, place, and time.     Cranial Nerves: No cranial nerve deficit.     Sensory: No sensory deficit.     Motor: No weakness.     Coordination: Coordination normal.     Gait: Gait normal.     Deep Tendon Reflexes: Reflexes normal.  Psychiatric:        Mood and Affect: Mood normal.        Behavior: Behavior normal.        Thought Content: Thought content normal.        Judgment: Judgment normal.       Assessment & Plan:  1. Routine general medical examination at a health care facility -Continue to exercise and eat healthy.  Follow-up in 1 year or sooner if needed - CBC with Differential/Platelet; Future - Lipid panel; Future - TSH; Future - CMP with eGFR([facility]); Future - CMP with eGFR([facility]) - TSH - Lipid panel - CBC with Differential/Platelet  2. Essential hypertension -Have her monitor her blood pressure at home over the next 2 weeks and she will send me her results.  Can consider adding blood pressure lowering medication - CBC with Differential/Platelet; Future - Lipid panel; Future - TSH; Future - CMP with eGFR([facility]); Future - CMP with eGFR([facility]) - TSH - Lipid panel - CBC with Differential/Platelet  3. Hypothyroidism, unspecified type - consider dose adjustment of synthroid  - CBC with Differential/Platelet; Future - Lipid panel; Future - TSH; Future - CMP with eGFR([facility]); Future - CMP with eGFR([facility]) - TSH - Lipid panel - CBC with Differential/Platelet  4. Attention deficit hyperactivity disorder (ADHD), combined type - Continue with plan of care by psychiatry   5. Pure hypercholesterolemia - Continue with Fenofibrate. Consider zetia  - CBC with  Differential/Platelet; Future - Lipid panel; Future - TSH; Future - CMP with eGFR([facility]); Future - CMP with eGFR([facility]) - TSH - Lipid panel - CBC with Differential/Platelet  6. Depression, recurrent ([place]) - Continue with plan of care by psychiatry   Dorothyann Peng, NP

## 2020-01-11 LAB — COMPLETE METABOLIC PANEL WITH GFR
AG Ratio: 1.7 (calc) (ref 1.0–2.5)
ALT: 16 U/L (ref 6–29)
AST: 22 U/L (ref 10–35)
Albumin: 4.9 g/dL (ref 3.6–5.1)
Alkaline phosphatase (APISO): 84 U/L (ref 37–153)
BUN/Creatinine Ratio: 23 (calc) — ABNORMAL HIGH (ref 6–22)
BUN: 23 mg/dL (ref 7–25)
CO2: 27 mmol/L (ref 20–32)
Calcium: 10.6 mg/dL — ABNORMAL HIGH (ref 8.6–10.4)
Chloride: 103 mmol/L (ref 98–110)
Creat: 1 mg/dL — ABNORMAL HIGH (ref 0.60–0.93)
GFR, Est African American: 66 mL/min/{1.73_m2} (ref >=60)
GFR, Est Non African American: 57 mL/min/{1.73_m2} — ABNORMAL LOW (ref >=60)
Globulin: 2.9 g/dL (calc) (ref 1.9–3.7)
Glucose, Bld: 112 mg/dL — ABNORMAL HIGH (ref 65–99)
Potassium: 5.1 mmol/L (ref 3.5–5.3)
Sodium: 141 mmol/L (ref 135–146)
Total Bilirubin: 0.4 mg/dL (ref 0.2–1.2)
Total Protein: 7.8 g/dL (ref 6.1–8.1)

## 2020-01-11 LAB — CBC WITH DIFFERENTIAL/PLATELET
Absolute Monocytes: 301 {cells}/uL (ref 200–950)
Basophils Absolute: 61 {cells}/uL (ref 0–200)
Basophils Relative: 1.3 %
Eosinophils Absolute: 132 {cells}/uL (ref 15–500)
Eosinophils Relative: 2.8 %
HCT: 42.5 % (ref 35.0–45.0)
Hemoglobin: 13.8 g/dL (ref 11.7–15.5)
Lymphs Abs: 1894 {cells}/uL (ref 850–3900)
MCH: 27.9 pg (ref 27.0–33.0)
MCHC: 32.5 g/dL (ref 32.0–36.0)
MCV: 86 fL (ref 80.0–100.0)
MPV: 10.4 fL (ref 7.5–12.5)
Monocytes Relative: 6.4 %
Neutro Abs: 2312 {cells}/uL (ref 1500–7800)
Neutrophils Relative %: 49.2 %
Platelets: 269 10*3/uL (ref 140–400)
RBC: 4.94 Million/uL (ref 3.80–5.10)
RDW: 13.4 % (ref 11.0–15.0)
Total Lymphocyte: 40.3 %
WBC: 4.7 10*3/uL (ref 3.8–10.8)

## 2020-01-11 LAB — LIPID PANEL
Cholesterol: 189 mg/dL
HDL: 52 mg/dL
LDL Cholesterol (Calc): 114 mg/dL — ABNORMAL HIGH
Non-HDL Cholesterol (Calc): 137 mg/dL — ABNORMAL HIGH
Total CHOL/HDL Ratio: 3.6 (calc)
Triglycerides: 120 mg/dL

## 2020-01-11 LAB — TSH: TSH: 1.57 m[IU]/L (ref 0.40–4.50)

## 2020-01-15 ENCOUNTER — Telehealth: Payer: Self-pay | Admitting: Adult Health

## 2020-01-15 DIAGNOSIS — N289 Disorder of kidney and ureter, unspecified: Secondary | ICD-10-CM

## 2020-01-15 DIAGNOSIS — R7309 Other abnormal glucose: Secondary | ICD-10-CM

## 2020-01-15 NOTE — Telephone Encounter (Signed)
Updated patient on her labs  

## 2020-01-22 NOTE — Addendum Note (Signed)
Addended by: Lerry Liner on: 01/22/2020 08:10 AM   Modules accepted: Orders

## 2020-01-28 ENCOUNTER — Other Ambulatory Visit: Payer: Self-pay

## 2020-01-28 ENCOUNTER — Other Ambulatory Visit (INDEPENDENT_AMBULATORY_CARE_PROVIDER_SITE_OTHER): Payer: PPO

## 2020-01-28 DIAGNOSIS — R7309 Other abnormal glucose: Secondary | ICD-10-CM

## 2020-01-28 DIAGNOSIS — N289 Disorder of kidney and ureter, unspecified: Secondary | ICD-10-CM

## 2020-01-28 LAB — BASIC METABOLIC PANEL
BUN: 21 mg/dL (ref 6–23)
CO2: 27 mEq/L (ref 19–32)
Calcium: 9.5 mg/dL (ref 8.4–10.5)
Chloride: 104 mEq/L (ref 96–112)
Creatinine, Ser: 0.79 mg/dL (ref 0.40–1.20)
GFR: 75.05 mL/min (ref >60.00)
Glucose, Bld: 147 mg/dL — ABNORMAL HIGH (ref 70–99)
Potassium: 3.8 mEq/L (ref 3.5–5.1)
Sodium: 137 mEq/L (ref 135–145)

## 2020-01-28 LAB — HEMOGLOBIN A1C: Hgb A1c MFr Bld: 6.3 % (ref 4.6–6.5)

## 2020-01-29 ENCOUNTER — Other Ambulatory Visit: Payer: Self-pay | Admitting: Adult Health

## 2020-01-29 LAB — PARATHYROID HORMONE, INTACT (NO CA): PTH: 30 pg/mL (ref 14–64)

## 2020-01-30 ENCOUNTER — Telehealth: Payer: Self-pay | Admitting: Adult Health

## 2020-01-30 NOTE — Telephone Encounter (Signed)
Patient is returning the call, please advise. CB is [phone]

## 2020-01-30 NOTE — Telephone Encounter (Signed)
Sent to the pharmacy by e-scribe. 

## 2020-01-30 NOTE — Telephone Encounter (Signed)
Pt notified.  See result note.  Nothing further needed.

## 2020-02-14 DIAGNOSIS — F900 Attention-deficit hyperactivity disorder, predominantly inattentive type: Secondary | ICD-10-CM | POA: Diagnosis not present

## 2020-04-19 ENCOUNTER — Emergency Department (HOSPITAL_BASED_OUTPATIENT_CLINIC_OR_DEPARTMENT_OTHER)
Admission: EM | Admit: 2020-04-19 | Discharge: 2020-04-19 | Disposition: A | Discharge status: Home / Self Care | Payer: PPO | Attending: Emergency Medicine | Admitting: Emergency Medicine

## 2020-04-19 ENCOUNTER — Encounter (HOSPITAL_BASED_OUTPATIENT_CLINIC_OR_DEPARTMENT_OTHER): Payer: Self-pay | Admitting: *Deleted

## 2020-04-19 ENCOUNTER — Emergency Department (HOSPITAL_BASED_OUTPATIENT_CLINIC_OR_DEPARTMENT_OTHER): Payer: PPO

## 2020-04-19 ENCOUNTER — Other Ambulatory Visit: Payer: Self-pay

## 2020-04-19 DIAGNOSIS — Z91040 Latex allergy status: Secondary | ICD-10-CM | POA: Diagnosis not present

## 2020-04-19 DIAGNOSIS — I10 Essential (primary) hypertension: Secondary | ICD-10-CM | POA: Diagnosis not present

## 2020-04-19 DIAGNOSIS — Z87891 Personal history of nicotine dependence: Secondary | ICD-10-CM | POA: Insufficient documentation

## 2020-04-19 DIAGNOSIS — K5732 Diverticulitis of large intestine without perforation or abscess without bleeding: Secondary | ICD-10-CM | POA: Diagnosis not present

## 2020-04-19 DIAGNOSIS — Z85828 Personal history of other malignant neoplasm of skin: Secondary | ICD-10-CM | POA: Insufficient documentation

## 2020-04-19 DIAGNOSIS — K5792 Diverticulitis of intestine, part unspecified, without perforation or abscess without bleeding: Secondary | ICD-10-CM | POA: Diagnosis not present

## 2020-04-19 DIAGNOSIS — R1032 Left lower quadrant pain: Secondary | ICD-10-CM | POA: Diagnosis present

## 2020-04-19 LAB — CBC WITH DIFFERENTIAL/PLATELET
Abs Immature Granulocytes: 0.04 10*3/uL (ref 0.00–0.07)
Basophils Absolute: 0 10*3/uL (ref 0.0–0.1)
Basophils Relative: 0 %
Eosinophils Absolute: 0.1 10*3/uL (ref 0.0–0.5)
Eosinophils Relative: 1 %
HCT: 38.3 % (ref 36.0–46.0)
Hemoglobin: 12.7 g/dL (ref 12.0–15.0)
Immature Granulocytes: 0 %
Lymphocytes Relative: 16 %
Lymphs Abs: 1.5 10*3/uL (ref 0.7–4.0)
MCH: 28.3 pg (ref 26.0–34.0)
MCHC: 33.2 g/dL (ref 30.0–36.0)
MCV: 85.3 fL (ref 80.0–100.0)
Monocytes Absolute: 0.7 10*3/uL (ref 0.1–1.0)
Monocytes Relative: 8 %
Neutro Abs: 6.7 10*3/uL (ref 1.7–7.7)
Neutrophils Relative %: 75 %
Platelets: 231 10*3/uL (ref 150–400)
RBC: 4.49 MIL/uL (ref 3.87–5.11)
RDW: 13.4 % (ref 11.5–15.5)
WBC: 9.1 10*3/uL (ref 4.0–10.5)
nRBC: 0 % (ref 0.0–0.2)

## 2020-04-19 LAB — URINALYSIS, ROUTINE W REFLEX MICROSCOPIC
Bilirubin Urine: NEGATIVE
Glucose, UA: NEGATIVE mg/dL
Hgb urine dipstick: NEGATIVE
Ketones, ur: NEGATIVE mg/dL
Leukocytes,Ua: NEGATIVE
Nitrite: NEGATIVE
Protein, ur: NEGATIVE mg/dL
Specific Gravity, Urine: 1.01 (ref 1.005–1.030)
pH: 7.5 (ref 5.0–8.0)

## 2020-04-19 LAB — LIPASE, BLOOD: Lipase: 37 U/L (ref 11–51)

## 2020-04-19 LAB — COMPREHENSIVE METABOLIC PANEL
ALT: 16 U/L (ref 0–44)
AST: 19 U/L (ref 15–41)
Albumin: 4.5 g/dL (ref 3.5–5.0)
Alkaline Phosphatase: 86 U/L (ref 38–126)
Anion gap: 10 (ref 5–15)
BUN: 18 mg/dL (ref 8–23)
CO2: 24 mmol/L (ref 22–32)
Calcium: 9.5 mg/dL (ref 8.9–10.3)
Chloride: 101 mmol/L (ref 98–111)
Creatinine, Ser: 0.87 mg/dL (ref 0.44–1.00)
GFR, Estimated: >60 mL/min (ref >60)
Glucose, Bld: 106 mg/dL — ABNORMAL HIGH (ref 70–99)
Potassium: 3.8 mmol/L (ref 3.5–5.1)
Sodium: 135 mmol/L (ref 135–145)
Total Bilirubin: 0.5 mg/dL (ref 0.3–1.2)
Total Protein: 7.7 g/dL (ref 6.5–8.1)

## 2020-04-19 MED ORDER — IOHEXOL 300 MG/ML  SOLN
100.0000 mL | Freq: Once | INTRAMUSCULAR | Status: AC | PRN
  Administered 2020-04-19: 100 mL via INTRAVENOUS

## 2020-04-19 MED ORDER — AMOXICILLIN-POT CLAVULANATE 875-125 MG PO TABS: 1.0000 | ORAL_TABLET | Freq: Two times a day (BID) | ORAL | 0 refills | Status: AC

## 2020-04-19 MED ORDER — FENTANYL CITRATE (PF) 100 MCG/2ML IJ SOLN
50.0000 ug | Freq: Once | INTRAMUSCULAR | Status: AC
Start: 2020-04-19 — End: 2020-04-19
  Administered 2020-04-19: 50 ug via INTRAVENOUS
  Filled 2020-04-19: qty 2

## 2020-04-19 MED ORDER — SODIUM CHLORIDE 0.9 % IV BOLUS
1000.0000 mL | Freq: Once | INTRAVENOUS | Status: AC
  Administered 2020-04-19: 1000 mL via INTRAVENOUS

## 2020-04-19 MED ORDER — AMOXICILLIN-POT CLAVULANATE 875-125 MG PO TABS
1.0000 | ORAL_TABLET | Freq: Once | ORAL | Status: AC
  Administered 2020-04-19: 1 via ORAL
  Filled 2020-04-19: qty 1

## 2020-04-19 NOTE — Discharge Instructions (Addendum)
You have acute diverticulitis.  Please return if you develop any worsening pain, nausea, vomiting, fever.  Take antibiotic as prescribed.  Recommend Tylenol Motrin for pain.

## 2020-04-19 NOTE — ED Triage Notes (Signed)
3 days of left lower quad pain. Increased pain with deep breaths, unable to sleep last night due to pain, denies N/V.

## 2020-04-19 NOTE — ED Notes (Signed)
Ambulated to BR to obtain UA

## 2020-04-19 NOTE — ED Provider Notes (Signed)
[facility] EMERGENCY DEPT Provider Note   CSN: [id number] Arrival date & time: 04/19/20  4098     History Chief Complaint  Patient presents with  . Abdominal Pain    Diana [name] is a 70 y.o. female.  The history is provided by the patient.  Abdominal Pain Pain location:  LLQ Pain quality: aching and bloating   Pain radiates to:  Does not radiate Pain severity:  Mild Onset quality:  Gradual Duration:  3 days Timing:  Constant Progression:  Unchanged Chronicity:  New Context: previous surgery (Hx of diverticulitis with ruptures and bowel resection )   Relieved by:  Nothing Worsened by:  Nothing Associated symptoms: no chest pain, no chills, no constipation, no cough, no diarrhea, no dysuria, no fever, no hematuria, no nausea, no shortness of breath, no sore throat and no vomiting   Risk factors: multiple surgeries        Past Medical History:  Diagnosis Date  . ADD (attention deficit disorder)   . Anxiety   . Cancer ([place])    skin  . Depression   . Diverticulosis   . DJD (degenerative joint disease)   . GERD (gastroesophageal reflux disease)   . History of hiatal hernia   . Hypertension   . Mood disorder ([place])   . SCC (squamous cell carcinoma) in situ 04/22/2010   right forearm  . STD (sexually transmitted disease)    HSV II    Patient Active Problem List   Diagnosis Date Noted  . GERD (gastroesophageal reflux disease)   . Fibroma of tongue 10/04/2018  . Cervical neck pain with evidence of disc disease 10/13/2015  . Postmenopausal atrophic vaginitis 05/05/2015  . Abnormal TSH 05/05/2015  . Bilateral hand pain 02/12/2014  . Hair loss 02/12/2014  . Cyst of nipple 01/05/2012  . Fatigue 06/29/2011  . PURE HYPERCHOLESTEROLEMIA 12/26/2008  . Attention deficit disorder 10/03/2008  . Essential hypertension 07/14/2006  . DIVERTICULOSIS, COLON 07/14/2006    Past Surgical History:  Procedure Laterality Date  . ABDOMINAL HYSTERECTOMY      partial  . BREAST IMPLANT REMOVAL    . CESAREAN SECTION    . COLON SURGERY     had about 1 foot of bowel removed   . HEMORRHOID SURGERY    . LIPOMA EXCISION N/A 10/23/2014   Procedure: EXCISION OF BACK LIPOMA;  Surgeon: Donnie Mesa, MD;  Location: [facility];  Service: General;  Laterality: N/A;  . PARTIAL COLECTOMY    . PARTIAL HYSTERECTOMY     still has ovaries   . PILONIDAL CYST EXCISION    . PLACEMENT OF BREAST IMPLANTS    . RHINOPLASTY     x 2  . SHOULDER SURGERY Right    06     OB History    Gravida  1   Para  1   Term  1   Preterm      AB      Living  1     SAB      IAB      Ectopic      Multiple      Live Births  1           Family History  Problem Relation Age of Onset  . Melanoma Mother   . Kidney cancer Father   . Breast cancer Paternal Grandmother   . Cancer Maternal Grandmother        possible uterine?     Social History   Tobacco  Use  . Smoking status: Former Smoker    Packs/day: 1.00    Years: 20.00    Pack years: 20.00    Types: Cigarettes    Quit date: 06/29/2006    Years since quitting: 13.8  . Smokeless tobacco: Never Used  Vaping Use  . Vaping Use: Never used  Substance Use Topics  . Alcohol use: No    Alcohol/week: 0.0 standard drinks    Comment: occ beer  . Drug use: No    Home Medications Prior to Admission medications   Medication Sig Start Date End Date Taking? Authorizing Provider  amoxicillin-clavulanate (AUGMENTIN) 875-125 MG tablet Take 1 tablet by mouth 2 (two) times daily for 10 days. 04/19/20 04/29/20 Yes Braxton Weisbecker, DO  Alum Hydroxide-Mag Carbonate (GAVISCON PO) Take by mouth as needed.     [provider]  amphetamine-dextroamphetamine (ADDERALL XR) 20 MG 24 hr capsule TAKE 1 TO 2 CAPSULES BY MOUTH EVERY MORNING 07/02/17   [provider]  Ascorbic Acid (VITAMIN C WITH ROSE HIPS) 500 MG tablet Take 500 mg by mouth daily.    [provider]  Cholecalciferol (VITAMIN D3) 50 MCG  (2000 UT) TABS Take 1 capsule by mouth daily.     [provider]  colchicine 0.6 MG tablet TAKE 1 TABLET (0.6 MG TOTAL) BY MOUTH DAILY AS NEEDED. 07/04/18   [place], Betty G, MD  fenofibrate (TRICOR) 145 MG tablet TAKE 1 TABLET BY MOUTH EVERY DAY 01/03/20   Nafziger, Tommi Rumps, NP  ibuprofen (ADVIL,MOTRIN) 200 MG tablet Take 400 mg by mouth every 6 (six) hours as needed.    [provider]  Multiple Vitamins-Minerals (MULTIVITAMIN ADULTS PO) Take by mouth daily.    [provider]  Multiple Vitamins-Minerals (ZINC PO) Take 20 mg by mouth daily.    [provider]  Omega-3 Fatty Acids (OMEGA 3 PO) Take 1 capsule by mouth daily.    [provider]  omeprazole (PRILOSEC) 40 MG capsule Take 1 capsule (40 mg total) by mouth daily. 05/24/19   Nafziger, Tommi Rumps, NP  sertraline (ZOLOFT) 50 MG tablet Take 50 mg by mouth daily. 05/15/19   [provider]  SYNTHROID 50 MCG tablet TAKE 1 TABLET BY MOUTH DAILY BEFORE BREAKFAST 01/30/20   Nafziger, Tommi Rumps, NP    Allergies    Ezetimibe, Latex, and Morphine sulfate  Review of Systems   Review of Systems  Constitutional: Negative for chills and fever.  HENT: Negative for ear pain and sore throat.   Eyes: Negative for pain and visual disturbance.  Respiratory: Negative for cough and shortness of breath.   Cardiovascular: Negative for chest pain and palpitations.  Gastrointestinal: Positive for abdominal distention and abdominal pain. Negative for constipation, diarrhea, nausea and vomiting.  Genitourinary: Negative for dysuria and hematuria.  Musculoskeletal: Negative for arthralgias and back pain.  Skin: Negative for color change and rash.  Neurological: Negative for seizures and syncope.  All other systems reviewed and are negative.   Physical Exam Updated Vital Signs BP (!) 147/119 (BP Location: Right Arm)   Pulse 76   Temp 98.7 F (37.1 C) (Oral)   Resp 18   Ht 4\' 11"  (1.499 m)   Wt 62.6 kg   SpO2 99%    BMI 27.87 kg/m   Physical Exam Vitals and nursing note reviewed.  Constitutional:      General: She is not in acute distress.    Appearance: She is well-developed.  HENT:     Head: Normocephalic and atraumatic.  Eyes:     Conjunctiva/sclera: Conjunctivae normal.  Cardiovascular:     Rate and Rhythm: Normal rate and regular rhythm.     Heart sounds: Normal heart sounds. No murmur heard.   Pulmonary:     Effort: Pulmonary effort is normal. No respiratory distress.     Breath sounds: Normal breath sounds.  Abdominal:     General: There is distension.     Palpations: Abdomen is soft.     Tenderness: There is abdominal tenderness in the left lower quadrant. There is guarding. There is no right CVA tenderness or left CVA tenderness.     Hernia: No hernia is present.  Musculoskeletal:     Cervical back: Neck supple.  Skin:    General: Skin is warm and dry.     Capillary Refill: Capillary refill takes less than 2 seconds.  Neurological:     General: No focal deficit present.     Mental Status: She is alert.  Psychiatric:        Mood and Affect: Mood normal.     ED Results / Procedures / Treatments   Labs (all labs ordered are listed, but only abnormal results are displayed) Labs Reviewed  COMPREHENSIVE METABOLIC PANEL - Abnormal; Notable for the following components:      Result Value   Glucose, Bld 106 (*)    All other components within normal limits  URINALYSIS, ROUTINE W REFLEX MICROSCOPIC - Abnormal; Notable for the following components:   Color, Urine COLORLESS (*)    All other components within normal limits  CBC WITH DIFFERENTIAL/PLATELET  LIPASE, BLOOD    EKG None  Radiology CT ABDOMEN PELVIS W CONTRAST  Result Date: 04/19/2020 CLINICAL DATA:  LEFT lower quadrant pain. EXAM: CT ABDOMEN AND PELVIS WITH CONTRAST TECHNIQUE: Multidetector CT imaging of the abdomen and pelvis was performed using the standard protocol following bolus administration of intravenous  contrast. CONTRAST:  176mL OMNIPAQUE IOHEXOL 300 MG/ML  SOLN COMPARISON:  None. FINDINGS: Lower chest: 5 mm nodule in the inferior lingula (image 3/series 4) nodule present on CT from 2018 and unchanged in size. Hepatobiliary: No focal hepatic lesion. No biliary duct dilatation. Common bile duct is normal. Pancreas: Pancreas is normal. No ductal dilatation. No pancreatic inflammation. Spleen: Normal spleen Adrenals/urinary tract: Adrenal glands and kidneys are normal. The ureters and bladder normal. Stomach/Bowel: Stomach, duodenum and small bowel normal. Cecum is normal. Along the descending colon there is an inflamed diverticulum measuring 1.6 cm on image 39/series 2. There is thickening along the peritoneal reflection in the LEFT pericolic gutter. There is no macro perforation. No abscess formation. The sigmoid colon is normal. Anastomosis at the rectosigmoid junction without obstruction. Rectum normal. Vascular/Lymphatic: Abdominal aorta is normal caliber. No periportal or retroperitoneal adenopathy. No pelvic adenopathy. Reproductive: Prostate normal Other: No free fluid. Musculoskeletal: No aggressive osseous lesion. IMPRESSION: 1. Acute diverticulitis of the descending colon. No macro perforation or abscess. Moderate inflammatory response in the LEFT pericolic gutter. 2. Rectosigmoid anastomosis without complication. Electronically Signed   By: Suzy Bouchard M.D.   On: 04/19/2020 11:19    Procedures Procedures   Medications Ordered in ED Medications  amoxicillin-clavulanate (AUGMENTIN) 875-125 MG per tablet 1 tablet (has no administration in time range)  fentaNYL (SUBLIMAZE) injection 50 mcg (50 mcg Intravenous Given 04/19/20 0946)  sodium chloride 0.9 % bolus 1,000 mL (1,000 mLs Intravenous New Bag/Given 04/19/20 0946)  iohexol (OMNIPAQUE) 300 MG/ML solution 100 mL (100 mLs Intravenous Contrast Given 04/19/20 1038)    ED  Course  I have reviewed the triage vital signs and the nursing  notes.  Pertinent labs & imaging results that were available during my care of the patient were reviewed by me and considered in my medical decision making (see chart for details).    MDM Rules/Calculators/A&P                          Diana [name] is here with abdominal pain.  History of diverticulitis status post bowel resection 10+ years ago.  Having left lower quadrant pain for the last several days.  No urinary symptoms.  Normal vitals.  No fever.  Denies any nausea or vomiting or diarrhea.  Concerning for diverticulitis versus bowel obstruction versus possibly UTI or constipation.  Will check basic labs.  Will give IV fentanyl, IV fluid bolus.  We will get basic labs and CT scan abdomen pelvis to further evaluate.  Lab work is overall unremarkable.  No significant anemia, electrolyte abnormality, kidney injury.  Patient has acute diverticulitis.  No abscess or perforation.  Will start Augmentin.  She understands return precautions and discharged in ED in good condition.  This chart was dictated using voice recognition software.  Despite best efforts to proofread,  errors can occur which can change the documentation meaning.   Final Clinical Impression(s) / ED Diagnoses Final diagnoses:  Acute diverticulitis    Rx / DC Orders ED Discharge Orders         Ordered    amoxicillin-clavulanate (AUGMENTIN) 875-125 MG tablet  2 times daily        04/19/20 1122           [place], DO 04/19/20 1123

## 2020-04-28 ENCOUNTER — Other Ambulatory Visit: Payer: Self-pay

## 2020-04-29 ENCOUNTER — Encounter: Payer: Self-pay | Admitting: Adult Health

## 2020-04-29 ENCOUNTER — Ambulatory Visit (INDEPENDENT_AMBULATORY_CARE_PROVIDER_SITE_OTHER): Payer: PPO | Admitting: Adult Health

## 2020-04-29 VITALS — BP 138/80 | HR 76 | Temp 98.1°F | Wt 141.0 lb

## 2020-04-29 DIAGNOSIS — E7439 Other disorders of intestinal carbohydrate absorption: Secondary | ICD-10-CM

## 2020-04-29 LAB — POCT GLYCOSYLATED HEMOGLOBIN (HGB A1C): Hemoglobin A1C: 6.2 % — AB (ref 4.0–5.6)

## 2020-04-29 NOTE — Progress Notes (Signed)
Subjective:    Patient ID: Diana [name], female    DOB: 11-01-1950, 70 y.o.   MRN: [id number]  HPI  70 year old female who  has a past medical history of ADD (attention deficit disorder), Anxiety, Cancer ([place]), Depression, Diverticulosis, DJD (degenerative joint disease), GERD (gastroesophageal reflux disease), History of hiatal hernia, Hypertension, Mood disorder ([place]), SCC (squamous cell carcinoma) in situ (04/22/2010), and STD (sexually transmitted disease).  She presents to the office today for follow-up regarding glucose intolerance.  During her CPE her A1c was slowly titrating upwards, with her most recent being 6.3.  She was asked to make some lifestyle modifications we would retest in 3 months.  Her father had diabetes from drinking   She does not eat a lot of sugars nor carbs. She has been trying to walk more  Wt Readings from Last 3 Encounters:  04/29/20 141 lb (64 kg)  04/19/20 138 lb (62.6 kg)  01/10/20 135 lb 9.6 oz (61.5 kg)   BP Readings from Last 3 Encounters:  04/29/20 138/80  04/19/20 (!) 147/119  01/10/20 (!) 150/80     Review of Systems See HPI   Past Medical History:  Diagnosis Date  . ADD (attention deficit disorder)   . Anxiety   . Cancer ([place])    skin  . Depression   . Diverticulosis   . DJD (degenerative joint disease)   . GERD (gastroesophageal reflux disease)   . History of hiatal hernia   . Hypertension   . Mood disorder ([place])   . SCC (squamous cell carcinoma) in situ 04/22/2010   right forearm  . STD (sexually transmitted disease)    HSV II    Social History   Socioeconomic History  . Marital status: Married    Spouse name: Not on file  . Number of children: 1  . Years of education: Not on file  . Highest education level: Not on file  Occupational History  . Not on file  Tobacco Use  . Smoking status: Former Smoker    Packs/day: 1.00    Years: 20.00    Pack years: 20.00    Types: Cigarettes    Quit date: 06/29/2006     Years since quitting: 13.8  . Smokeless tobacco: Never Used  Vaping Use  . Vaping Use: Never used  Substance and Sexual Activity  . Alcohol use: No    Alcohol/week: 0.0 standard drinks    Comment: occ beer  . Drug use: No  . Sexual activity: Not Currently    Birth control/protection: Post-menopausal  Other Topics Concern  . Not on file  Social History Narrative  . Not on file   Social Determinants of Health   Financial Resource Strain: Low Risk   . Difficulty of Paying Living Expenses: Not hard at all  Food Insecurity: No Food Insecurity  . Worried About Charity fundraiser in the Last Year: Never true  . Ran Out of Food in the Last Year: Never true  Transportation Needs: No Transportation Needs  . Lack of Transportation (Medical): No  . Lack of Transportation (Non-Medical): No  Physical Activity: Inactive  . Days of Exercise per Week: 0 days  . Minutes of Exercise per Session: 0 min  Stress: No Stress Concern Present  . Feeling of Stress : Not at all  Social Connections: Socially Integrated  . Frequency of Communication with Friends and Family: More than three times a week  . Frequency of Social Gatherings with Friends and  Family: More than three times a week  . Attends Religious Services: More than 4 times per year  . Active Member of Clubs or Organizations: Yes  . Attends Archivist Meetings: More than 4 times per year  . Marital Status: Married  Human resources officer Violence: Not At Risk  . Fear of Current or Ex-Partner: No  . Emotionally Abused: No  . Physically Abused: No  . Sexually Abused: No    Past Surgical History:  Procedure Laterality Date  . ABDOMINAL HYSTERECTOMY     partial  . BREAST IMPLANT REMOVAL    . CESAREAN SECTION    . COLON SURGERY     had about 1 foot of bowel removed   . HEMORRHOID SURGERY    . LIPOMA EXCISION N/A 10/23/2014   Procedure: EXCISION OF BACK LIPOMA;  Surgeon: Donnie Mesa, MD;  Location: [facility];  Service: General;   Laterality: N/A;  . PARTIAL COLECTOMY    . PARTIAL HYSTERECTOMY     still has ovaries   . PILONIDAL CYST EXCISION    . PLACEMENT OF BREAST IMPLANTS    . RHINOPLASTY     x 2  . SHOULDER SURGERY Right    06    Family History  Problem Relation Age of Onset  . Melanoma Mother   . Kidney cancer Father   . Breast cancer Paternal Grandmother   . Cancer Maternal Grandmother        possible uterine?     Allergies  Allergen Reactions  . Ezetimibe     REACTION: statins make joints ache  . Latex Hives  . Morphine Sulfate     REACTION: n \\T \ v    Current Outpatient Medications on File Prior to Visit  Medication Sig Dispense Refill  . Alum Hydroxide-Mag Carbonate (GAVISCON PO) Take by mouth as needed.     Marland Kitchen amphetamine-dextroamphetamine (ADDERALL XR) 20 MG 24 hr capsule Take 30 mg by mouth daily.  0  . Ascorbic Acid (VITAMIN C WITH ROSE HIPS) 500 MG tablet Take 500 mg by mouth daily.    . Cholecalciferol (VITAMIN D3) 50 MCG (2000 UT) TABS Take 1 capsule by mouth daily.     . colchicine 0.6 MG tablet TAKE 1 TABLET (0.6 MG TOTAL) BY MOUTH DAILY AS NEEDED. 30 tablet 0  . fenofibrate (TRICOR) 145 MG tablet TAKE 1 TABLET BY MOUTH EVERY DAY 90 tablet 1  . ibuprofen (ADVIL,MOTRIN) 200 MG tablet Take 400 mg by mouth every 6 (six) hours as needed.    . Multiple Vitamins-Minerals (ZINC PO) Take 20 mg by mouth daily.    . Omega-3 Fatty Acids (OMEGA 3 PO) Take 1 capsule by mouth daily.    Marland Kitchen omeprazole (PRILOSEC) 40 MG capsule Take 1 capsule (40 mg total) by mouth daily. 90 capsule 3  . sertraline (ZOLOFT) 50 MG tablet Take 50 mg by mouth daily.    Marland Kitchen SYNTHROID 50 MCG tablet TAKE 1 TABLET BY MOUTH DAILY BEFORE BREAKFAST 90 tablet 3  . amoxicillin-clavulanate (AUGMENTIN) 875-125 MG tablet Take 1 tablet by mouth 2 (two) times daily for 10 days. (Patient not taking: Reported on 04/29/2020) 20 tablet 0  . Multiple Vitamins-Minerals (MULTIVITAMIN ADULTS PO) Take by mouth daily. (Patient not taking:  Reported on 04/29/2020)     No current facility-administered medications on file prior to visit.    There were no vitals taken for this visit.      Objective:   Physical Exam Vitals and nursing note reviewed.  Constitutional:      Appearance: Normal appearance.  Cardiovascular:     Rate and Rhythm: Normal rate and regular rhythm.     Pulses: Normal pulses.     Heart sounds: Normal heart sounds.  Pulmonary:     Effort: Pulmonary effort is normal.     Breath sounds: Normal breath sounds.  Skin:    General: Skin is warm and dry.  Neurological:     General: No focal deficit present.     Mental Status: She is alert and oriented to person, place, and time.  Psychiatric:        Mood and Affect: Mood normal.        Behavior: Behavior normal.        Thought Content: Thought content normal.        Judgment: Judgment normal.       Assessment & Plan:  1. Glucose intolerance - POCT glycosylated hemoglobin (Hb A1C)- 6. 2 - Improved  - Continue walking   Dorothyann Peng, NP

## 2020-05-08 ENCOUNTER — Other Ambulatory Visit: Payer: Self-pay | Admitting: Adult Health

## 2020-05-08 DIAGNOSIS — K219 Gastro-esophageal reflux disease without esophagitis: Secondary | ICD-10-CM

## 2020-05-08 DIAGNOSIS — F900 Attention-deficit hyperactivity disorder, predominantly inattentive type: Secondary | ICD-10-CM | POA: Diagnosis not present

## 2020-05-16 ENCOUNTER — Telehealth: Payer: Self-pay | Admitting: Pharmacist

## 2020-05-16 NOTE — Chronic Care Management (AMB) (Signed)
I left the patient a message about her upcoming appointment on 05/19/2020 @ 10:00 am with the clinical pharmacist. She was asked to please have all medication on hand to review with the pharmacist.   Neita Goodnight) Mare Ferrari, [place] [phone]

## 2020-05-19 ENCOUNTER — Ambulatory Visit (INDEPENDENT_AMBULATORY_CARE_PROVIDER_SITE_OTHER): Payer: PPO | Admitting: Pharmacist

## 2020-05-19 DIAGNOSIS — I10 Essential (primary) hypertension: Secondary | ICD-10-CM

## 2020-05-19 DIAGNOSIS — F339 Major depressive disorder, recurrent, unspecified: Secondary | ICD-10-CM | POA: Diagnosis not present

## 2020-05-19 DIAGNOSIS — E039 Hypothyroidism, unspecified: Secondary | ICD-10-CM | POA: Diagnosis not present

## 2020-05-19 NOTE — Progress Notes (Signed)
Chronic Care Management Pharmacy Note  05/20/2020 Name:  Diana [name] MRN:  [id number] DOB:  [birth year]  Subjective: Diana [name] is an 70 y.o. year old female who is a primary patient of Dorothyann Peng, NP.  The CCM team was consulted for assistance with disease management and care coordination needs.    Engaged with patient by telephone for follow up visit in response to provider referral for pharmacy case management and/or care coordination services.   Consent to Services:  The patient was given information about Chronic Care Management services, agreed to services, and gave verbal consent prior to initiation of services.  Please see initial visit note for detailed documentation.   Patient Care Team: Dorothyann Peng, NP as PCP - General (Family Medicine) Roseanne Kaufman, MD as Consulting Physician (Orthopedic Surgery) Viona Gilmore, [facility] as Pharmacist (Pharmacist)  Recent office visits: 04/29/20 Dorothyann Peng, NP: Patient presented for repeat A1c. A1c slightly improved to 6.2%.   01/10/20 Dorothyann Peng, NP: Patient presented for annual exam. LDL is elevated, TGs improved.  01/01/20 Ofilia Neas, LPN: Patient presented for AWV.  Recent consult visits: 08/24/19 Harriett Sine (dermatology): Patient presented for follow up. Unable to access notes.  Hospital visits: 04/19/20 Patient presented to the ED for diverticulitis.  Objective:  Lab Results  Component Value Date   CREATININE 0.87 04/19/2020   BUN 18 04/19/2020   GFR 75.05 01/28/2020   GFRNONAA >60 04/19/2020   GFRAA 66 01/10/2020   NA 135 04/19/2020   K 3.8 04/19/2020   CALCIUM 9.5 04/19/2020   CO2 24 04/19/2020   GLUCOSE 106 (H) 04/19/2020    Lab Results  Component Value Date/Time   HGBA1C 6.2 (A) 04/29/2020 01:21 PM   HGBA1C 6.3 01/28/2020 10:59 AM   HGBA1C 6.2 01/02/2019 08:30 AM   GFR 75.05 01/28/2020 10:59 AM   GFR 70.76 01/02/2019 08:30 AM    Last diabetic Eye exam: No results found for:  HMDIABEYEEXA  Last diabetic Foot exam: No results found for: HMDIABFOOTEX   Lab Results  Component Value Date   CHOL 189 01/10/2020   HDL 52 01/10/2020   LDLCALC 114 (H) 01/10/2020   LDLDIRECT 137.0 01/02/2019   TRIG 120 01/10/2020   CHOLHDL 3.6 01/10/2020    Hepatic Function Latest Ref Rng & Units 04/19/2020 01/10/2020 05/31/2019  Total Protein 6.5 - 8.1 g/dL 7.7 7.8 7.5  Albumin 3.5 - 5.0 g/dL 4.5 - 4.7  AST 15 - 41 U/L 19 22 28   ALT 0 - 44 U/L 16 16 21   Alk Phosphatase 38 - 126 U/L 86 - 81  Total Bilirubin 0.3 - 1.2 mg/dL 0.5 0.4 0.4  Bilirubin, Direct 0.0 - 0.3 mg/dL - - 0.1    Lab Results  Component Value Date/Time   TSH 1.57 01/10/2020 11:09 AM   TSH 1.81 01/02/2019 08:30 AM   FREET4 1.11 02/21/2018 11:24 AM   FREET4 0.84 12/21/2017 03:04 PM    CBC Latest Ref Rng & Units 04/19/2020 01/10/2020 01/02/2019  WBC 4.0 - 10.5 K/uL 9.1 4.7 5.9  Hemoglobin 12.0 - 15.0 g/dL 12.7 13.8 13.3  Hematocrit 36.0 - 46.0 % 38.3 42.5 40.4  Platelets 150 - 400 K/uL 231 269 242.0    No results found for: VD25OH  Clinical ASCVD: No  The 10-year ASCVD risk score Mikey Bussing DC Jr., et al., 2013) is: 13.4%   Values used to calculate the score:     Age: 31 years     Sex: Female  Is Non-Hispanic African American: No     Diabetic: No     Tobacco smoker: No     Systolic Blood Pressure: 220 mmHg     Is BP treated: No     HDL Cholesterol: 52 mg/dL     Total Cholesterol: 189 mg/dL    Depression screen [facility] 2/9 01/01/2020 10/27/2017 09/02/2014  Decreased Interest 0 0 0  Down, Depressed, Hopeless 0 0 0  PHQ - 2 Score 0 0 0  Altered sleeping 0 - -  Tired, decreased energy 0 - -  Change in appetite 0 - -  Feeling bad or failure about yourself  0 - -  Trouble concentrating 0 - -  Moving slowly or fidgety/restless 0 - -  Suicidal thoughts 0 - -  PHQ-9 Score 0 - -  Difficult doing work/chores Not difficult at all - -      Social History   Tobacco Use  Smoking Status Former Smoker  .  Packs/day: 1.00  . Years: 20.00  . Pack years: 20.00  . Types: Cigarettes  . Quit date: 06/29/2006  . Years since quitting: 13.9  Smokeless Tobacco Never Used   BP Readings from Last 3 Encounters:  04/29/20 138/80  04/19/20 (!) 147/119  01/10/20 (!) 150/80   Pulse Readings from Last 3 Encounters:  04/29/20 76  04/19/20 76  01/10/20 72   Wt Readings from Last 3 Encounters:  04/29/20 141 lb (64 kg)  04/19/20 138 lb (62.6 kg)  01/10/20 135 lb 9.6 oz (61.5 kg)   BMI Readings from Last 3 Encounters:  04/29/20 28.48 kg/m  04/19/20 27.87 kg/m  01/10/20 27.39 kg/m    Assessment/Interventions: Review of patient past medical history, allergies, medications, health status, including review of consultants reports, laboratory and other test data, was performed as part of comprehensive evaluation and provision of chronic care management services.   SDOH:  (Social Determinants of Health) assessments and interventions performed: No  SDOH Screenings   Alcohol Screen: Low Risk   . Last Alcohol Screening Score (AUDIT): 0  Depression (PHQ2-9): Low Risk   . PHQ-2 Score: 0  Financial Resource Strain: Low Risk   . Difficulty of Paying Living Expenses: Not hard at all  Food Insecurity: No Food Insecurity  . Worried About Charity fundraiser in the Last Year: Never true  . Ran Out of Food in the Last Year: Never true  Housing: Low Risk   . Last Housing Risk Score: 0  Physical Activity: Inactive  . Days of Exercise per Week: 0 days  . Minutes of Exercise per Session: 0 min  Social Connections: Socially Integrated  . Frequency of Communication with Friends and Family: More than three times a week  . Frequency of Social Gatherings with Friends and Family: More than three times a week  . Attends Religious Services: More than 4 times per year  . Active Member of Clubs or Organizations: Yes  . Attends Archivist Meetings: More than 4 times per year  . Marital Status: Married   Stress: No Stress Concern Present  . Feeling of Stress : Not at all  Tobacco Use: Medium Risk  . Smoking Tobacco Use: Former Smoker  . Smokeless Tobacco Use: Never Used  Transportation Needs: No Transportation Needs  . Lack of Transportation (Medical): No  . Lack of Transportation (Non-Medical): No    CCM Care Plan  Allergies  Allergen Reactions  . Ezetimibe     REACTION: statins make joints ache  .  Latex Hives  . Morphine Sulfate     REACTION: n \T\ v    Medications Reviewed Today    Reviewed by Anderson Malta, [place] (Certified Medical Assistant) on 04/29/20 at 1308  Med List Status: <None>  Medication Order Taking? Sig Documenting Provider Last Dose Status Informant  Alum Hydroxide-Mag Carbonate (GAVISCON PO) [id number] Yes Take by mouth as needed.  [provider] Taking Active Self  amoxicillin-clavulanate (AUGMENTIN) 875-125 MG tablet [id number] No Take 1 tablet by mouth 2 (two) times daily for 10 days.  Patient not taking: Reported on 04/29/2020   Lennice Sites, DO Not Taking Active   amphetamine-dextroamphetamine (ADDERALL XR) 20 MG 24 hr capsule [id number] Yes Take 30 mg by mouth daily. [provider] Taking Active   Ascorbic Acid (VITAMIN C WITH ROSE HIPS) 500 MG tablet [id number] Yes Take 500 mg by mouth daily. [provider] Taking Active   Cholecalciferol (VITAMIN D3) 50 MCG (2000 UT) TABS [id number] Yes Take 1 capsule by mouth daily.  [provider] Taking Active   colchicine 0.6 MG tablet [id number] Yes TAKE 1 TABLET (0.6 MG TOTAL) BY MOUTH DAILY AS NEEDED. Martinique, Betty G, MD Taking Active   fenofibrate (TRICOR) 145 MG tablet [id number] Yes TAKE 1 TABLET BY MOUTH EVERY DAY Nafziger, Tommi Rumps, NP Taking Active   ibuprofen (ADVIL,MOTRIN) 200 MG tablet [id number] Yes Take 400 mg by mouth every 6 (six) hours as needed. [provider] Taking Active   Multiple Vitamins-Minerals (MULTIVITAMIN ADULTS PO) [id number] No Take by mouth daily.   Patient not taking: Reported on 04/29/2020   [provider] Not Taking Active   Multiple Vitamins-Minerals (ZINC PO) [id number] Yes Take 20 mg by mouth daily. [provider] Taking Active   Omega-3 Fatty Acids (OMEGA 3 PO) [id number] Yes Take 1 capsule by mouth daily. [provider] Taking Active   omeprazole (PRILOSEC) 40 MG capsule [id number] Yes Take 1 capsule (40 mg total) by mouth daily. Nafziger, Tommi Rumps, NP Taking Active   sertraline (ZOLOFT) 50 MG tablet [id number] Yes Take 50 mg by mouth daily. [provider] Taking Active   SYNTHROID 50 MCG tablet [id number] Yes TAKE 1 TABLET BY MOUTH DAILY BEFORE BREAKFAST Nafziger, Tommi Rumps, NP Taking Active           Patient Active Problem List   Diagnosis Date Noted  . GERD (gastroesophageal reflux disease)   . Fibroma of tongue 10/04/2018  . Cervical neck pain with evidence of disc disease 10/13/2015  . Postmenopausal atrophic vaginitis 05/05/2015  . Abnormal TSH 05/05/2015  . Bilateral hand pain 02/12/2014  . Hair loss 02/12/2014  . Cyst of nipple 01/05/2012  . Fatigue 06/29/2011  . PURE HYPERCHOLESTEROLEMIA 12/26/2008  . Attention deficit disorder 10/03/2008  . Essential hypertension 07/14/2006  . DIVERTICULOSIS, COLON 07/14/2006    Immunization History  Administered Date(s) Administered  . Fluad Quad(high Dose 65+) 10/26/2018  . Influenza Split 09/28/2012  . Influenza Whole 11/05/2004, 09/18/2008  . Influenza, High Dose Seasonal PF 09/02/2014, 10/13/2015, 11/18/2016, 10/20/2017, 10/29/2019  . Influenza-Unspecified 10/18/2013  . PFIZER(Purple Top)SARS-COV-2 Vaccination 08/18/2019, 09/08/2019  . Pneumococcal Conjugate-13 05/05/2015  . Pneumococcal Polysaccharide-23 03/21/2007, 02/11/2017  . Td 01/19/2003  . Tdap 05/05/2015  . Zoster 03/27/2013    Conditions to be addressed/monitored:  Hypertension, Hyperlipidemia, Hypothyroidism, Depression, Gout and ADHD  Care Plan : [place]   Updates made by Viona Gilmore, [place] since 05/20/2020 12:00 AM    Problem: Problem: Hypertension, Hyperlipidemia, Hypothyroidism, Depression, Gout and ADHD  Long-Range Goal: Patient-Specific Goal   Start Date: 05/19/2020  Expected End Date: 05/19/2021  This Visit's Progress: On track  Priority: High  Note:   Current Barriers:  . Unable to independently monitor therapeutic efficacy  Pharmacist Clinical Goal(s):  Marland Kitchen Patient will achieve adherence to monitoring guidelines and medication adherence to achieve therapeutic efficacy through collaboration with PharmD and provider.   Interventions: . 1:1 collaboration with Dorothyann Peng, NP regarding development and update of comprehensive plan of care as evidenced by provider attestation and co-signature . Inter-disciplinary care team collaboration (see longitudinal plan of care) . Comprehensive medication review performed; medication list updated in electronic medical record  Hypertension (BP goal <140/90) -Uncontrolled -Current treatment: . No medications -Medications previously tried:  triamterene/ HCTZ (can exacerbate gout)  -Current home readings: could not provide -Current dietary habits: limits salt intake -Current exercise habits: tries to walk every day in the morning -Reports hypotensive/hypertensive symptoms -Educated on Exercise goal of 150 minutes per week; Importance of home blood pressure monitoring; Proper BP monitoring technique; Symptoms of hypotension and importance of maintaining adequate hydration; -Counseled to monitor BP at home twice weekly, document, and provide log at future appointments -Counseled on diet and exercise extensively Recommended to continue current medication Will follow up in 2-3 weeks for BP assessment  Hyperlipidemia: (LDL goal < 100) -Uncontrolled -Current treatment:  Fenofibrate 186m, 1 tablet once daily   Omega 3 fatty acids 1 capsule daily -Medications previously tried: statins  (intolerant due to joints ache), Zetia (joint pain)  -Current dietary patterns: limited red meat intake (due to gout) -Current exercise habits:  tries to walk every day in the morning -Educated on Cholesterol goals;  Importance of limiting foods high in cholesterol; Exercise goal of 150 minutes per week; -Counseled on diet and exercise extensively Recommended to continue current medication  Depression (Goal: minimize symptoms) -Controlled -Current treatment: . Sertraline 530m 1/2 tablet once daily -Medications previously tried/failed: n/a -PHQ9: 0 -Educated on Benefits of medication for symptom control -Recommended to continue current medication Counseled on side effects of sertraline and possible etiologies of humming  ADHD (Goal: improve attention) -Controlled -Current treatment  . Adderall XR 3066m1 capsules every morning  -Medications previously tried: none  -Recommended to continue current medication  Hypothyroidism (Goal: TSH 0.35-4.5) -Controlled -Current treatment  . Synthroid 63m38m1 tablet once daily before breakfast  -Medications previously tried: none  -Recommended to continue current medication  Gout (Goal: uric acid < 6 and prevent flare ups) -Controlled -Current treatment  . Colchicine 0.6mg,55mtablet daily as needed -Medications previously tried: none  -Counseled on eating a low-purine diet. Avoid foods and drinks such as: liver, kidney, anchovies, asparagus, herring, mushrooms, mussels, beer,etc.  GERD (Goal: minimize symptoms) -Controlled -Current treatment   Omeprazole 40mg,36mablet once daily   Gaviscon as needed (patient reports taking if GERD wakes her up)  - not frequent use -Medications previously tried: none  -Counseled on non-pharmacological interventions for acid reflux. Take measures to prevent acid reflux, such as avoiding spicy foods, avoiding caffeine, avoid laying down a few hours after eating, and raising the head of the bed.;  patient has cut down on portion size for lunch    Health Maintenance -Vaccine gaps: shingrix, COVID booster -Current therapy:   Cholecalciferol (vitamin D3) 2000 units, 1 capsule once daily  Ibuprofen 200mg, 55mblets every six hours as needed (patient states taking 1-2 times/week for arthritis on base of thumb)     Vitamin B12 SL 1000 mcg 1  tablet daily -Educated on Cost vs benefit of each product must be carefully weighed by individual consumer -Patient is satisfied with current therapy and denies issues -Counseled on ibuprofen contributing to ringing in ears as this is a more common side effect  Patient Goals/Self-Care Activities . Patient will:  - take medications as prescribed check blood pressure a few times a week, document, and provide at future appointments  Follow Up Plan: Telephone follow up appointment with care management team member scheduled for: 6 months       Medication Assistance: None required.  Patient affirms current coverage meets needs.  Patient's preferred pharmacy [facility]:  [id number] [id number]: [phone]: [phone] [id number] [place] [address]: [phone]: [phone] [address] [place] [address]one: [phone]: [phone] pill box? Yes - fills it up for 2 weeks in advance Pt endorses 100% compliance  We discussed: Current pharmacy is preferred with insurance plan and patient is satisfied with pharmacy services Patient decided to: Continue current medication management strategy  Care Plan and Follow Up Patient Decision:  Patient agrees to Care Plan and Follow-up.  Plan: Telephone follow up appointment with care management team member scheduled for:  6  months  MaJeni SallesPharmD BCGraysonharmacist [place] [phone]

## 2020-06-11 ENCOUNTER — Telehealth: Payer: Self-pay | Admitting: Pharmacist

## 2020-06-11 NOTE — Chronic Care Management (AMB) (Signed)
    Chronic Care Management Pharmacy Assistant   Name: Diana [name]  MRN: [id number] DOB: [birth year]  Reason for Encounter: Disease State   Conditions to be addressed/monitored: HTN   Recent office visits:  None  Recent consult visits:  None  Hospital visits:  None in previous 6 months  Medications: Outpatient Encounter Medications as of 06/11/2020  Medication Sig   Alum Hydroxide-Mag Carbonate (GAVISCON PO) Take by mouth as needed.    amphetamine-dextroamphetamine (ADDERALL XR) 30 MG 24 hr capsule Take 30 mg by mouth daily.   Ascorbic Acid (VITAMIN C WITH ROSE HIPS) 500 MG tablet Take 500 mg by mouth daily.   Cholecalciferol (VITAMIN D3) 50 MCG (2000 UT) TABS Take 1 capsule by mouth daily.    colchicine 0.6 MG tablet TAKE 1 TABLET (0.6 MG TOTAL) BY MOUTH DAILY AS NEEDED.   fenofibrate (TRICOR) 145 MG tablet TAKE 1 TABLET BY MOUTH EVERY DAY   ibuprofen (ADVIL,MOTRIN) 200 MG tablet Take 400 mg by mouth every 6 (six) hours as needed.   Multiple Vitamins-Minerals (MULTIVITAMIN ADULTS PO) Take by mouth daily. (Patient not taking: Reported on 04/29/2020)   Multiple Vitamins-Minerals (ZINC PO) Take 20 mg by mouth daily.   Omega-3 Fatty Acids (OMEGA 3 PO) Take 1 capsule by mouth daily.   omeprazole (PRILOSEC) 40 MG capsule TAKE 1 CAPSULE BY MOUTH EVERY DAY   sertraline (ZOLOFT) 50 MG tablet Take 50 mg by mouth daily.   SYNTHROID 50 MCG tablet TAKE 1 TABLET BY MOUTH DAILY BEFORE BREAKFAST   No facility-administered encounter medications on file as of 06/11/2020.   Third unsuccessful telephone outreach was attempted today. Left several messages and no return.   Star Rating Drugs: None  [name], [name] Pharmacist Assistant [phone]

## 2020-06-18 ENCOUNTER — Telehealth (INDEPENDENT_AMBULATORY_CARE_PROVIDER_SITE_OTHER): Payer: PPO | Admitting: Adult Health

## 2020-06-18 ENCOUNTER — Encounter: Payer: Self-pay | Admitting: Adult Health

## 2020-06-18 VITALS — BP 124/77 | HR 69 | Temp 98.5°F | Ht 59.0 in | Wt 141.0 lb

## 2020-06-18 DIAGNOSIS — K5792 Diverticulitis of intestine, part unspecified, without perforation or abscess without bleeding: Secondary | ICD-10-CM

## 2020-06-18 MED ORDER — AMOXICILLIN-POT CLAVULANATE 875-125 MG PO TABS: 1.0000 | ORAL_TABLET | Freq: Two times a day (BID) | ORAL | 0 refills | Status: DC

## 2020-06-18 NOTE — Progress Notes (Signed)
Virtual Visit via Telephone Note  I connected with Diana [name] on 06/18/20 at 11:00 AM EDT by telephone and verified that I am speaking with the correct person using two identifiers.   I discussed the limitations, risks, security and privacy concerns of performing an evaluation and management service by telephone and the availability of in person appointments. I also discussed with the patient that there may be a patient responsible charge related to this service. The patient expressed understanding and agreed to proceed.  Location patient: home Location provider: work or home office Participants present for the call: patient, provider Patient did not have a visit in the prior 7 days to address this/these issue(s).   History of Present Illness: 69 year old female who  has a past medical history of ADD (attention deficit disorder), Anxiety, Cancer ([place]), Depression, Diverticulosis, DJD (degenerative joint disease), GERD (gastroesophageal reflux disease), History of hiatal hernia, Hypertension, Mood disorder ([place]), SCC (squamous cell carcinoma) in situ (04/22/2010), and STD (sexually transmitted disease).  She is being evaluated today for concern of recurrent diverticulitis flare.  He has a history of diverticulitis status post bowel resection 10+ years ago.  Was seen in the emergency room on 04/19/2020 with an acute flare.  Her most recent flare started 2 days ago with left lower quadrant pain, distention, and a fever up to 100.1.  Reports symptoms are similar to her previous flares.  She denies nausea, vomiting, diarrhea, or constipation.   Observations/Objective: Patient sounds cheerful and well on the phone. I do not appreciate any SOB. Speech and thought processing are grossly intact. Patient reported vitals:  Assessment and Plan: 1. Acute diverticulitis - amoxicillin-clavulanate (AUGMENTIN) 875-125 MG tablet; Take 1 tablet by mouth 2 (two) times daily.  Dispense: 20 tablet; Refill:  0 - Follow up if not improving in the next 2-3 days or sooner if symptoms worsen    Follow Up Instructions:   99441 5-10 99442 11-20 9443 21-30 I did not refer this patient for an OV in the next 24 hours for this/these issue(s).  I discussed the assessment and treatment plan with the patient. The patient was provided an opportunity to ask questions and all were answered. The patient agreed with the plan and demonstrated an understanding of the instructions.   The patient was advised to call back or seek an in-person evaluation if the symptoms worsen or if the condition fails to improve as anticipated.  I provided 15 minutes of non-face-to-face time during this encounter.   Dorothyann Peng, NP

## 2020-06-21 ENCOUNTER — Other Ambulatory Visit: Payer: Self-pay | Admitting: Adult Health

## 2020-07-03 ENCOUNTER — Ambulatory Visit: Payer: PPO

## 2020-07-10 ENCOUNTER — Other Ambulatory Visit: Payer: Self-pay

## 2020-07-10 ENCOUNTER — Other Ambulatory Visit: Payer: Self-pay | Admitting: Adult Health

## 2020-07-10 ENCOUNTER — Ambulatory Visit
Admission: RE | Admit: 2020-07-10 | Discharge: 2020-07-10 | Disposition: A | Discharge status: Home / Self Care | Payer: PPO | Source: Ambulatory Visit | Attending: Adult Health | Admitting: Adult Health

## 2020-07-10 DIAGNOSIS — Z1231 Encounter for screening mammogram for malignant neoplasm of breast: Secondary | ICD-10-CM

## 2020-08-11 DIAGNOSIS — F900 Attention-deficit hyperactivity disorder, predominantly inattentive type: Secondary | ICD-10-CM | POA: Diagnosis not present

## 2020-08-25 DIAGNOSIS — D225 Melanocytic nevi of trunk: Secondary | ICD-10-CM | POA: Diagnosis not present

## 2020-08-25 DIAGNOSIS — D2272 Melanocytic nevi of left lower limb, including hip: Secondary | ICD-10-CM | POA: Diagnosis not present

## 2020-08-25 DIAGNOSIS — L570 Actinic keratosis: Secondary | ICD-10-CM | POA: Diagnosis not present

## 2020-08-25 DIAGNOSIS — L218 Other seborrheic dermatitis: Secondary | ICD-10-CM | POA: Diagnosis not present

## 2020-08-25 DIAGNOSIS — L814 Other melanin hyperpigmentation: Secondary | ICD-10-CM | POA: Diagnosis not present

## 2020-08-25 DIAGNOSIS — D2271 Melanocytic nevi of right lower limb, including hip: Secondary | ICD-10-CM | POA: Diagnosis not present

## 2020-08-25 DIAGNOSIS — Z85828 Personal history of other malignant neoplasm of skin: Secondary | ICD-10-CM | POA: Diagnosis not present

## 2020-09-23 ENCOUNTER — Telehealth: Payer: Self-pay | Admitting: Pharmacist

## 2020-09-23 NOTE — Progress Notes (Signed)
Chronic Care Management Pharmacy Assistant   Name: Diana [name]  MRN: [id number] DOB: 1950/08/19    Reason for Encounter: Disease State/ Hypertension Assessment Call   Conditions to be addressed/monitored: HTN  Recent office visits:  06-18-2020 Dorothyann Peng, NP - Patient presented for Acute diverticulitis. Prescribed Amoxicillin - Pot Clavulanate 875.  Recent consult visits:  None  Hospital visits:  Medication Reconciliation was completed by comparing discharge summary, patient's EMR and Pharmacy list, and upon discussion with patient.  Patient Presented to [place] ED on 04-19-2020 due to Acute Diverticulitis. Patient was present for 2 hours.   New?Medications Started at [facility] Discharge:?? -started  AUGMENTIN 875-125 MG tablet  Medication Changes at Hospital Discharge: -Changed  None  Medications Discontinued at Hospital Discharge: -Stopped  None  Medications that remain the same after Hospital Discharge:??  -All other medications will remain the same.    Medications: Outpatient Encounter Medications as of 09/23/2020  Medication Sig   Alum Hydroxide-Mag Carbonate (GAVISCON PO) Take by mouth as needed.    amoxicillin-clavulanate (AUGMENTIN) 875-125 MG tablet Take 1 tablet by mouth 2 (two) times daily.   amphetamine-dextroamphetamine (ADDERALL XR) 30 MG 24 hr capsule Take 30 mg by mouth daily.   Ascorbic Acid (VITAMIN C WITH ROSE HIPS) 500 MG tablet Take 500 mg by mouth daily.   Cholecalciferol (VITAMIN D3) 50 MCG (2000 UT) TABS Take 1 capsule by mouth daily.    colchicine 0.6 MG tablet TAKE 1 TABLET (0.6 MG TOTAL) BY MOUTH DAILY AS NEEDED.   fenofibrate (TRICOR) 145 MG tablet TAKE 1 TABLET BY MOUTH EVERY DAY   ibuprofen (ADVIL,MOTRIN) 200 MG tablet Take 400 mg by mouth every 6 (six) hours as needed.   Multiple Vitamins-Minerals (MULTIVITAMIN ADULTS PO) Take by mouth daily.   Multiple Vitamins-Minerals (ZINC PO) Take 20 mg by mouth daily.    Omega-3 Fatty Acids (OMEGA 3 PO) Take 1 capsule by mouth daily.   omeprazole (PRILOSEC) 40 MG capsule TAKE 1 CAPSULE BY MOUTH EVERY DAY   sertraline (ZOLOFT) 50 MG tablet Take 50 mg by mouth daily.   SYNTHROID 50 MCG tablet TAKE 1 TABLET BY MOUTH DAILY BEFORE BREAKFAST   No facility-administered encounter medications on file as of 09/23/2020.  Reviewed chart prior to disease state call. Spoke with patient regarding BP  Recent Office Vitals: BP Readings from Last 3 Encounters:  06/18/20 124/77  04/29/20 138/80  04/19/20 (!) 147/119   Pulse Readings from Last 3 Encounters:  06/18/20 69  04/29/20 76  04/19/20 76    Wt Readings from Last 3 Encounters:  06/18/20 141 lb (64 kg)  04/29/20 141 lb (64 kg)  04/19/20 138 lb (62.6 kg)     Kidney Function Lab Results  Component Value Date/Time   CREATININE 0.87 04/19/2020 09:42 AM   CREATININE 0.79 01/28/2020 10:59 AM   CREATININE 1.00 (H) 01/10/2020 11:09 AM   GFR 75.05 01/28/2020 10:59 AM   GFRNONAA >60 04/19/2020 09:42 AM   GFRNONAA 57 (L) 01/10/2020 11:09 AM   GFRAA 66 01/10/2020 11:09 AM    BMP Latest Ref Rng & Units 04/19/2020 01/28/2020 01/10/2020  Glucose 70 - 99 mg/dL 106(H) 147(H) 112(H)  BUN 8 - 23 mg/dL '18 21 23  '$ Creatinine 0.44 - 1.00 mg/dL 0.87 0.79 1.00(H)  BUN/Creat Ratio 6 - 22 (calc) - - 23(H)  Sodium 135 - 145 mmol/L 135 137 141  Potassium 3.5 - 5.1 mmol/L 3.8 3.8 5.1  Chloride 98 - 111 mmol/L 101  104 103  CO2 22 - 32 mmol/L '24 27 27  '$ Calcium 8.9 - 10.3 mg/dL 9.5 9.5 10.6(H)   Care Gaps: Zoster Vaccine - Overdue COVID Booster #3 Therapist, music) - Overdue Flu Vaccine - Overdue CCM Follow up on 11-18-2020 AW last in 2021- MSG sent to [place] to schedule.   Star Rating Drugs: None  Notes: Attempted to reach on 09-23-2020 left vm Attempted to reach on 09-25-2020 left vm Attempted to reach on 09-30-2020 left vm  [place] Pharmacist Assistant [phone]

## 2020-10-27 ENCOUNTER — Telehealth: Payer: Self-pay | Admitting: Adult Health

## 2020-10-27 NOTE — Telephone Encounter (Signed)
Patient called to see if covid medication can be sent in to [facility] today at  [address], [place], [place] [address]  Phone number for pharmacy is  [phone]   Patient is scheduled for virtual tomorrow at 4:30      Please Advise

## 2020-10-27 NOTE — Telephone Encounter (Signed)
Pts husband called, Marticia has tested positive for Covid and they are out of town. Wanting to know if there is anything that can be done for her.  CB: [phone]

## 2020-10-28 ENCOUNTER — Telehealth (INDEPENDENT_AMBULATORY_CARE_PROVIDER_SITE_OTHER): Payer: PPO | Admitting: Adult Health

## 2020-10-28 ENCOUNTER — Encounter: Payer: Self-pay | Admitting: Adult Health

## 2020-10-28 VITALS — Temp 99.2°F | Ht 59.0 in | Wt 141.0 lb

## 2020-10-28 DIAGNOSIS — U071 COVID-19: Secondary | ICD-10-CM | POA: Diagnosis not present

## 2020-10-28 MED ORDER — NIRMATRELVIR/RITONAVIR (PAXLOVID)TABLET: 3.0000 | ORAL_TABLET | Freq: Two times a day (BID) | ORAL | 0 refills | Status: AC

## 2020-10-28 NOTE — Progress Notes (Signed)
Virtual Visit via Telephone Note  I connected with Diana [name] on 10/28/20 at  4:30 PM EDT by telephone and verified that I am speaking with the correct person using two identifiers.   I discussed the limitations, risks, security and privacy concerns of performing an evaluation and management service by telephone and the availability of in person appointments. I also discussed with the patient that there may be a patient responsible charge related to this service. The patient expressed understanding and agreed to proceed.  Location patient: home Location provider: work or home office Participants present for the call: patient, provider Patient did not have a visit in the prior 7 days to address this/these issue(s).   History of Present Illness:   70 year old female who  has a past medical history of ADD (attention deficit disorder), Anxiety, Cancer ([place]), Depression, Diverticulosis, DJD (degenerative joint disease), GERD (gastroesophageal reflux disease), History of hiatal hernia, Hypertension, Mood disorder ([place]), SCC (squamous cell carcinoma) in situ (04/22/2010), and STD (sexually transmitted disease).  She is being evaluated today for an acute issue. She tested positive for Covid 19 yesterday morning when her symptoms started. Her symptoms include headaches, fevers up to 100.8, body and joint pain, dry cough and fatigue.   She has had two vaccinations.   She is interested in antiviral therapy.    Observations/Objective: Patient sounds cheerful and well on the phone. I do not appreciate any SOB. Speech and thought processing are grossly intact. Patient reported vitals:  Assessment and Plan: 1. COVID-19 - nirmatrelvir/ritonavir EUA (PAXLOVID) 20 x 150 MG & 10 x 100MG  TABS; Take 3 tablets by mouth 2 (two) times daily for 5 days. (Take nirmatrelvir 150 mg two tablets twice daily for 5 days and ritonavir 100 mg one tablet twice daily for 5 days) Patient GFR is <60  Dispense: 30  tablet; Refill: 0   Follow Up Instructions:   I did not refer this patient for an OV in the next 24 hours for this/these issue(s).  I discussed the assessment and treatment plan with the patient. The patient was provided an opportunity to ask questions and all were answered. The patient agreed with the plan and demonstrated an understanding of the instructions.   The patient was advised to call back or seek an in-person evaluation if the symptoms worsen or if the condition fails to improve as anticipated.  I provided 20  minutes of non-face-to-face time during this encounter.   Dorothyann Peng, NP

## 2020-10-28 NOTE — Telephone Encounter (Signed)
Will wait for appt. To address

## 2020-11-02 ENCOUNTER — Other Ambulatory Visit: Payer: Self-pay | Admitting: Adult Health

## 2020-11-02 DIAGNOSIS — K219 Gastro-esophageal reflux disease without esophagitis: Secondary | ICD-10-CM

## 2020-11-10 DIAGNOSIS — F900 Attention-deficit hyperactivity disorder, predominantly inattentive type: Secondary | ICD-10-CM | POA: Diagnosis not present

## 2020-11-13 ENCOUNTER — Telehealth: Payer: Self-pay | Admitting: Pharmacist

## 2020-11-13 NOTE — Chronic Care Management (AMB) (Signed)
    Chronic Care Management Pharmacy Assistant   Name: Diana [name]  MRN: [id number] DOB: Jun 20, 1950   11/13/20 APPOINTMENT REMINDER   Called [name] No answer, left message of appointment on 11/14/20 at 10 via telephone visit with Jeni Salles, Pharm D.   Notified to have all medications, supplements, blood pressure and/or blood sugar logs available during appointment and to return call if need to reschedule.     Care Gaps: Zoster Vaccine - Overdue COVID Booster #3 Therapist, music) - Overdue BP - 130/80 (4/22) AWV- Office aware to schedule   Star Rating Drug: None  Any gaps in medications fill history? None  Medications: Outpatient Encounter Medications as of 11/13/2020  Medication Sig   Alum Hydroxide-Mag Carbonate (GAVISCON PO) Take by mouth as needed.    amphetamine-dextroamphetamine (ADDERALL XR) 30 MG 24 hr capsule Take 30 mg by mouth daily.   Ascorbic Acid (VITAMIN C WITH ROSE HIPS) 500 MG tablet Take 500 mg by mouth daily.   Cholecalciferol (VITAMIN D3) 50 MCG (2000 UT) TABS Take 1 capsule by mouth daily.    fenofibrate (TRICOR) 145 MG tablet TAKE 1 TABLET BY MOUTH EVERY DAY   ibuprofen (ADVIL,MOTRIN) 200 MG tablet Take 400 mg by mouth every 6 (six) hours as needed.   Multiple Vitamins-Minerals (MULTIVITAMIN ADULTS PO) Take by mouth daily.   Multiple Vitamins-Minerals (ZINC PO) Take 20 mg by mouth daily.   Omega-3 Fatty Acids (OMEGA 3 PO) Take 1 capsule by mouth daily.   omeprazole (PRILOSEC) 40 MG capsule TAKE 1 CAPSULE BY MOUTH EVERY DAY   sertraline (ZOLOFT) 50 MG tablet Take 50 mg by mouth daily.   SYNTHROID 50 MCG tablet TAKE 1 TABLET BY MOUTH DAILY BEFORE BREAKFAST   No facility-administered encounter medications on file as of 11/13/2020.    [place] Clinical Pharmacist Assistant [phone]

## 2020-11-14 ENCOUNTER — Ambulatory Visit (INDEPENDENT_AMBULATORY_CARE_PROVIDER_SITE_OTHER): Payer: PPO | Admitting: Pharmacist

## 2020-11-14 DIAGNOSIS — I10 Essential (primary) hypertension: Secondary | ICD-10-CM

## 2020-11-14 DIAGNOSIS — F339 Major depressive disorder, recurrent, unspecified: Secondary | ICD-10-CM

## 2020-11-14 NOTE — Progress Notes (Signed)
Chronic Care Management Pharmacy Note  11/14/2020 Name:  Diana [name] MRN:  [id number] DOB:  1950/12/22  Summary: Pt is still recovering from [place]   Recommendations/Changes made from today's visit: -Recommended trial of famotidine to replace omeprazole as this is not effective -Removed Adderall and sertraline from medication list as patient is no longer taking -Recommend repeat DEXA scan -Repeat TSH and consider dose decrease of Synthroid to minimize risk of worsening osteopenia   Plan: BP assessment in 3-4 weeks   Subjective: Diana [name] is an 70 y.o. year old female who is a primary patient of Dorothyann Peng, NP.  The CCM team was consulted for assistance with disease management and care coordination needs.    Engaged with patient by telephone for follow up visit in response to provider referral for pharmacy case management and/or care coordination services.   Consent to Services:  The patient was given information about Chronic Care Management services, agreed to services, and gave verbal consent prior to initiation of services.  Please see initial visit note for detailed documentation.   Patient Care Team: Dorothyann Peng, NP as PCP - General (Family Medicine) Roseanne Kaufman, MD as Consulting Physician (Orthopedic Surgery) Viona Gilmore, [facility] as Pharmacist (Pharmacist)  Recent office visits: 10/28/20 Dorothyann Peng, NP: Patient presented for video visit for COVID 19 infection. Prescribed Paxlovid.   06/18/2020 Dorothyann Peng, NP - Patient presented for Acute diverticulitis. Prescribed Amoxicillin - Pot Clavulanate 875.  Recent consult visits: 08/25/20 Harriett Sine (dermatology): Patient presented for follow up. Unable to access notes.  Hospital visits: None in previous 6 months.  Objective:  Lab Results  Component Value Date   CREATININE 0.87 04/19/2020   BUN 18 04/19/2020   GFR 75.05 01/28/2020   GFRNONAA >60 04/19/2020   GFRAA 66 01/10/2020   NA  135 04/19/2020   K 3.8 04/19/2020   CALCIUM 9.5 04/19/2020   CO2 24 04/19/2020   GLUCOSE 106 (H) 04/19/2020    Lab Results  Component Value Date/Time   HGBA1C 6.2 (A) 04/29/2020 01:21 PM   HGBA1C 6.3 01/28/2020 10:59 AM   HGBA1C 6.2 01/02/2019 08:30 AM   GFR 75.05 01/28/2020 10:59 AM   GFR 70.76 01/02/2019 08:30 AM    Last diabetic Eye exam: No results found for: HMDIABEYEEXA  Last diabetic Foot exam: No results found for: HMDIABFOOTEX   Lab Results  Component Value Date   CHOL 189 01/10/2020   HDL 52 01/10/2020   LDLCALC 114 (H) 01/10/2020   LDLDIRECT 137.0 01/02/2019   TRIG 120 01/10/2020   CHOLHDL 3.6 01/10/2020    Hepatic Function Latest Ref Rng & Units 04/19/2020 01/10/2020 05/31/2019  Total Protein 6.5 - 8.1 g/dL 7.7 7.8 7.5  Albumin 3.5 - 5.0 g/dL 4.5 - 4.7  AST 15 - 41 U/L 19 22 28   ALT 0 - 44 U/L 16 16 21   Alk Phosphatase 38 - 126 U/L 86 - 81  Total Bilirubin 0.3 - 1.2 mg/dL 0.5 0.4 0.4  Bilirubin, Direct 0.0 - 0.3 mg/dL - - 0.1    Lab Results  Component Value Date/Time   TSH 1.57 01/10/2020 11:09 AM   TSH 1.81 01/02/2019 08:30 AM   FREET4 1.11 02/21/2018 11:24 AM   FREET4 0.84 12/21/2017 03:04 PM    CBC Latest Ref Rng & Units 04/19/2020 01/10/2020 01/02/2019  WBC 4.0 - 10.5 K/uL 9.1 4.7 5.9  Hemoglobin 12.0 - 15.0 g/dL 12.7 13.8 13.3  Hematocrit 36.0 - 46.0 % 38.3 42.5 40.4  Platelets  150 - 400 K/uL 231 269 242.0    No results found for: VD25OH  Clinical ASCVD: No  The 10-year ASCVD risk score (Arnett DK, et al., 2019) is: 11%   Values used to calculate the score:     Age: 70 years     Sex: Female     Is Non-Hispanic African American: No     Diabetic: No     Tobacco smoker: No     Systolic Blood Pressure: 867 mmHg     Is BP treated: No     HDL Cholesterol: 52 mg/dL     Total Cholesterol: 189 mg/dL    Depression screen [facility] 2/9 01/01/2020 10/27/2017 09/02/2014  Decreased Interest 0 0 0  Down, Depressed, Hopeless 0 0 0  PHQ - 2 Score 0 0 0   Altered sleeping 0 - -  Tired, decreased energy 0 - -  Change in appetite 0 - -  Feeling bad or failure about yourself  0 - -  Trouble concentrating 0 - -  Moving slowly or fidgety/restless 0 - -  Suicidal thoughts 0 - -  PHQ-9 Score 0 - -  Difficult doing work/chores Not difficult at all - -      Social History   Tobacco Use  Smoking Status Former   Packs/day: 1.00   Years: 20.00   Pack years: 20.00   Types: Cigarettes   Quit date: 06/29/2006   Years since quitting: 14.3  Smokeless Tobacco Never   BP Readings from Last 3 Encounters:  06/18/20 124/77  04/29/20 138/80  04/19/20 (!) 147/119   Pulse Readings from Last 3 Encounters:  06/18/20 69  04/29/20 76  04/19/20 76   Wt Readings from Last 3 Encounters:  10/28/20 141 lb (64 kg)  06/18/20 141 lb (64 kg)  04/29/20 141 lb (64 kg)   BMI Readings from Last 3 Encounters:  10/28/20 28.48 kg/m  06/18/20 28.48 kg/m  04/29/20 28.48 kg/m    Assessment/Interventions: Review of patient past medical history, allergies, medications, health status, including review of consultants reports, laboratory and other test data, was performed as part of comprehensive evaluation and provision of chronic care management services.   SDOH:  (Social Determinants of Health) assessments and interventions performed: No    SDOH Screenings   Alcohol Screen: Low Risk    Last Alcohol Screening Score (AUDIT): 0  Depression (PHQ2-9): Low Risk    PHQ-2 Score: 0  Financial Resource Strain: Not on file  Food Insecurity: No Food Insecurity   Worried About Charity fundraiser in the Last Year: Never true   Ran Out of Food in the Last Year: Never true  Housing: [place] Risk Score: 0  Physical Activity: Inactive   Days of Exercise per Week: 0 days   Minutes of Exercise per Session: 0 min  Social Connections: Engineer, building services of Communication with Friends and Family: More than three times a week   Frequency  of Social Gatherings with Friends and Family: More than three times a week   Attends Religious Services: More than 4 times per year   Active Member of [employer] or Organizations: Yes   Attends Music therapist: More than 4 times per year   Marital Status: Married  Stress: No Stress Concern Present   Feeling of Stress : Not at all  Tobacco Use: Medium Risk   Smoking Tobacco Use: Former   Smokeless Tobacco Use: Never   Passive  Exposure: Not on file  Transportation Needs: No Transportation Needs   Lack of Transportation (Medical): No   Lack of Transportation (Non-Medical): No    CCM Care Plan  Allergies  Allergen Reactions   Ezetimibe     REACTION: statins make joints ache   Latex Hives   Morphine Sulfate     REACTION: n \T\ v    Medications Reviewed Today     Reviewed by Viona Gilmore, [facility] (Pharmacist) on 11/14/20 at 46  Med List Status: <None>   Medication Order Taking? Sig Documenting Provider Last Dose Status Informant  Alum Hydroxide-Mag Carbonate (GAVISCON PO) [id number] Yes Take by mouth as needed.  [provider] Taking Active Self  Ascorbic Acid (VITAMIN C WITH ROSE HIPS) 500 MG tablet [id number] Yes Take 500 mg by mouth daily. [provider] Taking Active   Cholecalciferol (VITAMIN D3) 50 MCG (2000 UT) TABS [id number] Yes Take 1 capsule by mouth daily.  [provider] Taking Active   fenofibrate (TRICOR) 145 MG tablet [id number] Yes TAKE 1 TABLET BY MOUTH EVERY DAY Nafziger, Tommi Rumps, NP Taking Active   ibuprofen (ADVIL,MOTRIN) 200 MG tablet [id number]  Take 400 mg by mouth every 6 (six) hours as needed. [provider]  Active   MAGNESIUM CITRATE PO [id number] Yes Take 150 mg by mouth. [provider] Taking Active   magnesium citrate solution [id number] Yes Take 296 mLs by mouth once. [provider] Taking Active   Multiple Vitamins-Minerals (ZINC PO) [id number] Yes Take 20 mg by mouth daily. [provider] Taking Active   Omega-3 Fatty Acids (OMEGA 3 PO) [id number] Yes Take 1 capsule by mouth daily. [provider] Taking Active   omeprazole (PRILOSEC) 40 MG capsule [id number] Yes TAKE 1 CAPSULE BY MOUTH EVERY DAY  Patient taking differently: Take 40 mg by mouth daily as needed.   Dorothyann Peng, NP Taking Active   SYNTHROID 50 MCG tablet [id number] Yes TAKE 1 TABLET BY MOUTH DAILY BEFORE BREAKFAST Nafziger, Tommi Rumps, NP Taking Active   vitamin B-12 (CYANOCOBALAMIN) 1000 MCG tablet [id number] Yes Take 1,000 mcg by mouth daily. [provider] Taking Active             Patient Active Problem List   Diagnosis Date Noted   GERD (gastroesophageal reflux disease)    Fibroma of tongue 10/04/2018   Cervical neck pain with evidence of disc disease 10/13/2015   Postmenopausal atrophic vaginitis 05/05/2015   Abnormal TSH 05/05/2015   Bilateral hand pain 02/12/2014   Hair loss 02/12/2014   Cyst of nipple 01/05/2012   Fatigue 06/29/2011   PURE HYPERCHOLESTEROLEMIA 12/26/2008   Attention deficit disorder 10/03/2008   Essential hypertension 07/14/2006   DIVERTICULOSIS, COLON 07/14/2006    Immunization History  Administered Date(s) Administered   Fluad Quad(high Dose 65+) 10/26/2018   Influenza Split 09/28/2012   Influenza Whole 11/05/2004, 09/18/2008   Influenza, High Dose Seasonal PF 09/02/2014, 10/13/2015, 11/18/2016, 10/20/2017, 10/29/2019, 10/06/2020   Influenza-Unspecified 10/18/2013   PFIZER(Purple Top)SARS-COV-2 Vaccination 08/18/2019, 09/08/2019   Pneumococcal Conjugate-13 05/05/2015   Pneumococcal Polysaccharide-23 03/21/2007, 02/11/2017   Td 01/19/2003   Tdap 05/05/2015   Zoster, Live 03/27/2013   Patient is still recovering from [place] and is still having memory fog and feeling fatigued. She and her husband have been having these symptoms for the last 3 weeks.   Patient inquired with her psychiatrist about stopping the sertraline and Adderall. She  stopped this with taking the Paxlovid and has not restarted taking them.  Conditions to be addressed/monitored:  Hypertension, Hyperlipidemia, Hypothyroidism, Depression, Gout and ADHD  Conditions addressed this visit: Hypertension, depression, ADHD, GERD  Care Plan : CCM Pharmacy Care Plan  Updates made by Viona Gilmore, [place] since 11/14/2020 12:00 AM     Problem: Problem: Hypertension, Hyperlipidemia, Hypothyroidism, Depression, Gout and ADHD      Long-Range Goal: Patient-Specific Goal   Start Date: 05/19/2020  Expected End Date: 05/19/2021  Recent Progress: On track  Priority: High  Note:   Current Barriers:  Unable to independently monitor therapeutic efficacy  Pharmacist Clinical Goal(s):  Patient will achieve adherence to monitoring guidelines and medication adherence to achieve therapeutic efficacy through collaboration with PharmD and provider.   Interventions: 1:1 collaboration with Dorothyann Peng, NP regarding development and update of comprehensive plan of care as evidenced by provider attestation and co-signature Inter-disciplinary care team collaboration (see longitudinal plan of care) Comprehensive medication review performed; medication list updated in electronic medical record  Hypertension (BP goal <140/90) -Uncontrolled -Current treatment: No medications -Medications previously tried:  triamterene/ HCTZ (can exacerbate gout)  -Current home readings: just got a new BP cuff (arm cuff) -Current dietary habits: limits salt intake -Current exercise habits: tries to walk every day in the morning -Reports hypotensive/hypertensive symptoms -Educated on Exercise goal of 150 minutes per week; Importance of home blood pressure monitoring; Proper BP monitoring technique; Symptoms of hypotension and importance of maintaining adequate hydration; -Counseled to monitor BP at home weekly, document, and provide log at future appointments -Counseled on diet and exercise  extensively Recommended to continue current medication Will follow up in 1 month for BP assessment  Hyperlipidemia: (LDL goal < 100) -Uncontrolled -Current treatment: Fenofibrate 17m, 1 tablet once daily  Omega 3 fatty acids 1 capsule daily -Medications previously tried: statins (intolerant due to joints ache), Zetia (joint pain)  -Current dietary patterns: limited red meat intake (due to gout) -Current exercise habits:  tries to walk every day in the morning -Educated on Cholesterol goals;  Importance of limiting foods high in cholesterol; Exercise goal of 150 minutes per week; -Counseled on diet and exercise extensively Recommended to continue current medication  Depression (Goal: minimize symptoms) -Controlled -Current treatment: No medications -Medications previously tried/failed: sertraline -PHQ9: 0 -Educated on Benefits of medication for symptom control -Recommended to continue current medication  ADHD (Goal: improve attention) -Controlled -Current treatment  None -Medications previously tried: Adderall  -Recommended to continue as is.  Hypothyroidism (Goal: TSH 2.5-4.5 (with osteopenia)) -Not ideally controlled -Current treatment  Synthroid 567m, 1 tablet once daily before breakfast  -Medications previously tried: none  - Consider Synthroid dose decrease to target higher TSH and minimize risk of fractures.  Gout (Goal: uric acid < 6 and prevent flare ups) -Controlled -Current treatment  Colchicine 0.28m43m1 tablet daily as needed -Medications previously tried: none  -Counseled on eating a low-purine diet. Avoid foods and drinks such as: liver, kidney, anchovies, asparagus, herring, mushrooms, mussels, beer,etc.  GERD (Goal: minimize symptoms) -Controlled -Current treatment  Omeprazole 90m13m tablet once daily as needed  Gaviscon as needed (patient reports taking if GERD wakes her up)  - not frequent use -Medications previously tried: none  -Counseled on  non-pharmacological interventions for acid reflux. Take measures to prevent acid reflux, such as avoiding spicy foods, avoiding caffeine, avoid laying down a few hours after eating, and raising the head of the bed.; patient has cut down on portion size for lunch    Health Maintenance -Vaccine gaps: shingrix, COVID booster -Current therapy:  Cholecalciferol (vitamin D3) 2000 units, 1 capsule once daily Ibuprofen 244m, 2 tablets every six hours as needed (patient states taking 1-2 times/week for arthritis on base of thumb)    Vitamin B12 SL 1000 mcg 1 tablet daily -Educated on Cost vs benefit of each product must be carefully weighed by individual consumer -Patient is satisfied with current therapy and denies issues -Counseled on ibuprofen contributing to ringing in ears as this is a more common side effect  Patient Goals/Self-Care Activities Patient will:  - take medications as prescribed check blood pressure a few times a week, document, and provide at future appointments  Follow Up Plan: Telephone follow up appointment with care management team member scheduled for: 6 months        Medication Assistance: None required.  Patient affirms current coverage meets needs.  Compliance/Adherence/Medication fill history: Care Gaps: Zoster Vaccine - Overdue COVID Booster #3 (Therapist, music - Overdue BP - 130/80 (4/22)   Star-Rating Drugs: None  Patient's preferred pharmacy is:  [id number] [address] [place]: [phone]: [phone]  [id number] [place] [place]: [phone]: [phone]land[facility] [place] [id number]: [phone]: [address] [place] [place]one: [phone]: [phone] pill box? Yes - fills it up for 2 weeks in advance Pt endorses 100% compliance  We discussed: Current pharmacy is preferred with insurance plan and patient is satisfied with pharmacy services Patient decided to: Continue current medication management strategy  Care Plan and Follow Up Patient Decision:  Patient agrees to Care Plan and Follow-up.  Plan: Telephone follow up appointment with care management team member scheduled for:  1 year  MadJeni SallesharmD BCAMoorearmacist LeBOccidental Petroleum BraGalesville[address] [phone]

## 2020-11-14 NOTE — Patient Instructions (Addendum)
Hi Diana [name],  It was great to catch up with you over the telephone! I hope you feel better soon!  Please reach out to me if you have any questions or need anything before our follow up!  Best, Maddie  Jeni Salles, PharmD, [place] Pharmacist [place] [facility] [employer]   Visit Information   Goals Addressed   None    Patient Care Plan: CCM Pharmacy Care Plan     Problem Identified: Problem: Hypertension, Hyperlipidemia, Hypothyroidism, Depression, Gout and ADHD      Long-Range Goal: Patient-Specific Goal   Start Date: 05/19/2020  Expected End Date: 05/19/2021  Recent Progress: On track  Priority: High  Note:   Current Barriers:  Unable to independently monitor therapeutic efficacy  Pharmacist Clinical Goal(s):  Patient will achieve adherence to monitoring guidelines and medication adherence to achieve therapeutic efficacy through collaboration with PharmD and provider.   Interventions: 1:1 collaboration with Dorothyann Peng, NP regarding development and update of comprehensive plan of care as evidenced by provider attestation and co-signature Inter-disciplinary care team collaboration (see longitudinal plan of care) Comprehensive medication review performed; medication list updated in electronic medical record  Hypertension (BP goal <140/90) -Uncontrolled -Current treatment: No medications -Medications previously tried:  triamterene/ HCTZ (can exacerbate gout)  -Current home readings: just got a new BP cuff (arm cuff) -Current dietary habits: limits salt intake -Current exercise habits: tries to walk every day in the morning -Reports hypotensive/hypertensive symptoms -Educated on Exercise goal of 150 minutes per week; Importance of home blood pressure monitoring; Proper BP monitoring technique; Symptoms of hypotension and importance of maintaining adequate hydration; -Counseled to monitor BP at home weekly, document, and provide log at future  appointments -Counseled on diet and exercise extensively Recommended to continue current medication Will follow up in 1 month for BP assessment  Hyperlipidemia: (LDL goal < 100) -Uncontrolled -Current treatment: Fenofibrate 145mg , 1 tablet once daily  Omega 3 fatty acids 1 capsule daily -Medications previously tried: statins (intolerant due to joints ache), Zetia (joint pain)  -Current dietary patterns: limited red meat intake (due to gout) -Current exercise habits:  tries to walk every day in the morning -Educated on Cholesterol goals;  Importance of limiting foods high in cholesterol; Exercise goal of 150 minutes per week; -Counseled on diet and exercise extensively Recommended to continue current medication  Depression (Goal: minimize symptoms) -Controlled -Current treatment: No medications -Medications previously tried/failed: sertraline -PHQ9: 0 -Educated on Benefits of medication for symptom control -Recommended to continue current medication  ADHD (Goal: improve attention) -Controlled -Current treatment  None -Medications previously tried: Adderall  -Recommended to continue as is.  Hypothyroidism (Goal: TSH 2.5-4.5 (with osteopenia)) -Not ideally controlled -Current treatment  Synthroid 80mcg, 1 tablet once daily before breakfast  -Medications previously tried: none  - Consider Synthroid dose decrease to target higher TSH and minimize risk of fractures.  Gout (Goal: uric acid < 6 and prevent flare ups) -Controlled -Current treatment  Colchicine 0.6mg , 1 tablet daily as needed -Medications previously tried: none  -Counseled on eating a low-purine diet. Avoid foods and drinks such as: liver, kidney, anchovies, asparagus, herring, mushrooms, mussels, beer,etc.  GERD (Goal: minimize symptoms) -Controlled -Current treatment  Omeprazole 40mg , 1 tablet once daily as needed  Gaviscon as needed (patient reports taking if GERD wakes her up)  - not frequent  use -Medications previously tried: none  -Counseled on non-pharmacological interventions for acid reflux. Take measures to prevent acid reflux, such as avoiding spicy foods, avoiding caffeine, avoid laying down  a few hours after eating, and raising the head of the bed.; patient has cut down on portion size for lunch  Recommended trial of famotidine to replace omeprazole.   Health Maintenance -Vaccine gaps: shingrix, COVID booster -Current therapy:  Cholecalciferol (vitamin D3) 2000 units, 1 capsule once daily Ibuprofen 200mg , 2 tablets every six hours as needed (patient states taking 1-2 times/week for arthritis on base of thumb)    Vitamin B12 SL 1000 mcg 1 tablet daily -Educated on Cost vs benefit of each product must be carefully weighed by individual consumer -Patient is satisfied with current therapy and denies issues -Counseled on ibuprofen contributing to ringing in ears as this is a more common side effect  Patient Goals/Self-Care Activities Patient will:  - take medications as prescribed check blood pressure a few times a week, document, and provide at future appointments  Follow Up Plan: Telephone follow up appointment with care management team member scheduled for: 6 months       Patient verbalizes understanding of instructions provided today and agrees to view in [place].  Telephone follow up appointment with pharmacy team member scheduled for: 1 year  Viona Gilmore, [facility]

## 2020-11-17 DIAGNOSIS — F339 Major depressive disorder, recurrent, unspecified: Secondary | ICD-10-CM

## 2020-11-17 DIAGNOSIS — I10 Essential (primary) hypertension: Secondary | ICD-10-CM

## 2020-11-18 ENCOUNTER — Other Ambulatory Visit: Payer: Self-pay | Admitting: Adult Health

## 2020-11-18 ENCOUNTER — Telehealth: Payer: PPO

## 2020-11-18 DIAGNOSIS — E2839 Other primary ovarian failure: Secondary | ICD-10-CM

## 2020-12-10 ENCOUNTER — Telehealth: Payer: Self-pay | Admitting: Pharmacist

## 2020-12-10 NOTE — Chronic Care Management (AMB) (Signed)
Chronic Care Management Pharmacy Assistant   Name: Diana [name]  MRN: [id number] DOB: 08/23/1950  Reason for Encounter: Disease State / Hypertension Assessment    Conditions to be addressed/monitored: HTN  Recent office visits:  None  Recent consult visits:  None  Hospital visits:  None in previous 6 months  Medications: Outpatient Encounter Medications as of 12/10/2020  Medication Sig   Alum Hydroxide-Mag Carbonate (GAVISCON PO) Take by mouth as needed.    Ascorbic Acid (VITAMIN C WITH ROSE HIPS) 500 MG tablet Take 500 mg by mouth daily.   Cholecalciferol (VITAMIN D3) 50 MCG (2000 UT) TABS Take 1 capsule by mouth daily.    fenofibrate (TRICOR) 145 MG tablet TAKE 1 TABLET BY MOUTH EVERY DAY   ibuprofen (ADVIL,MOTRIN) 200 MG tablet Take 400 mg by mouth every 6 (six) hours as needed.   MAGNESIUM CITRATE PO Take 150 mg by mouth.   magnesium citrate solution Take 296 mLs by mouth once.   Multiple Vitamins-Minerals (ZINC PO) Take 20 mg by mouth daily.   Omega-3 Fatty Acids (OMEGA 3 PO) Take 1 capsule by mouth daily.   omeprazole (PRILOSEC) 40 MG capsule TAKE 1 CAPSULE BY MOUTH EVERY DAY (Patient taking differently: Take 40 mg by mouth daily as needed.)   Probiotic Product (PROBIOTIC ADVANCED PO) Take 1 capsule by mouth daily.   SYNTHROID 50 MCG tablet TAKE 1 TABLET BY MOUTH DAILY BEFORE BREAKFAST   vitamin B-12 (CYANOCOBALAMIN) 1000 MCG tablet Take 1,000 mcg by mouth daily.   No facility-administered encounter medications on file as of 12/10/2020.  Reviewed chart prior to disease state call. Spoke with patient regarding BP  Recent Office Vitals: BP Readings from Last 3 Encounters:  06/18/20 124/77  04/29/20 138/80  04/19/20 (!) 147/119   Pulse Readings from Last 3 Encounters:  06/18/20 69  04/29/20 76  04/19/20 76    Wt Readings from Last 3 Encounters:  10/28/20 141 lb (64 kg)  06/18/20 141 lb (64 kg)  04/29/20 141 lb (64 kg)     Kidney Function Lab Results   Component Value Date/Time   CREATININE 0.87 04/19/2020 09:42 AM   CREATININE 0.79 01/28/2020 10:59 AM   CREATININE 1.00 (H) 01/10/2020 11:09 AM   GFR 75.05 01/28/2020 10:59 AM   GFRNONAA >60 04/19/2020 09:42 AM   GFRNONAA 57 (L) 01/10/2020 11:09 AM   GFRAA 66 01/10/2020 11:09 AM    BMP Latest Ref Rng & Units 04/19/2020 01/28/2020 01/10/2020  Glucose 70 - 99 mg/dL 106(H) 147(H) 112(H)  BUN 8 - 23 mg/dL 18 21 23   Creatinine 0.44 - 1.00 mg/dL 0.87 0.79 1.00(H)  BUN/Creat Ratio 6 - 22 (calc) - - 23(H)  Sodium 135 - 145 mmol/L 135 137 141  Potassium 3.5 - 5.1 mmol/L 3.8 3.8 5.1  Chloride 98 - 111 mmol/L 101 104 103  CO2 22 - 32 mmol/L 24 27 27   Calcium 8.9 - 10.3 mg/dL 9.5 9.5 10.6(H)    Current antihypertensive regimen:  No medications How often are you checking your Blood Pressure? 1-2x per week Current home BP readings: Patient reports home readings  of 152/81 and 131/77 Denies headaches, reports she is going through a very stressful marriage and noticed her pressures were higher when drinking regular coffee. She has since switched to decaf.  What recent interventions/DTPs have been made by any provider to improve Blood Pressure control since last CPP Visit: Patient reports no changes other than stopping her ADHD medication as it lead to higher  readings Any recent hospitalizations or ED visits since last visit with CPP? None What diet changes have been made to improve Blood Pressure Control?  Patient reports watching sodium levels with meals What exercise is being done to improve your Blood Pressure Control?  Patient walks in the mornings  Adherence Review: Is the patient currently on ACE/ARB medication? No Does the patient have >5 day gap between last estimated fill dates? No  Notes:  Per Watt Climes advised patient her Dexa has been ordered and she may call the office to schedule Patient had already scheduled for April 2023. Patient also reports she has been dealing with Anxiety  for a got bit of her life, but has been struggling with it more as of late. Advised her I would pass that info to the pharmacist to see if there were any recommendations she had. Patient reports she has also been seeing therapy.   Care Gaps: Zoster Vaccines - Overdue  COVID Vaccines - Overdue BP- 131/77 (Home) AWV-MSG sent to [name] CMA to schedule. CCM - 10/23  Star Rating Drugs: None   Patient Assistance: None  Ned Clines [facility] Clinical Pharmacist Assistant [phone]

## 2020-12-18 ENCOUNTER — Other Ambulatory Visit: Payer: Self-pay | Admitting: Adult Health

## 2021-01-02 ENCOUNTER — Telehealth: Payer: Self-pay

## 2021-01-02 DIAGNOSIS — R051 Acute cough: Secondary | ICD-10-CM | POA: Diagnosis not present

## 2021-01-02 NOTE — Telephone Encounter (Signed)
--  caller states she has a cough. says it came from an acid reflux incident last saturday. she started coughing a lot. then it got better but then started back coughing bad again and it is down in her chest. she is taking coughing med. she is having coughing spells. coughing up white/yellow. no fever. says she does not feel bad and has still been walking  01/02/2021 9:45:45 AM See HCP within 4 Hours (or PCP triage) [place], RN, [place]  01/02/21 1200: Pt states she is leaving UC now; xray resulted did not show pneumonia, but a "thickening". Pt was given a z pack & instructions to f/u with PCP. F/u appt made with PCP on 01/07/21. Pt instructed that if symptoms worsened before the weekend or seeing PCP, then to return to UC or make sooner appt. Pt verb understanding.

## 2021-01-07 ENCOUNTER — Encounter: Payer: Self-pay | Admitting: Adult Health

## 2021-01-07 ENCOUNTER — Ambulatory Visit (INDEPENDENT_AMBULATORY_CARE_PROVIDER_SITE_OTHER): Payer: PPO | Admitting: Adult Health

## 2021-01-07 VITALS — BP 110/80 | HR 73 | Temp 98.6°F | Ht 59.0 in | Wt 146.0 lb

## 2021-01-07 DIAGNOSIS — K2100 Gastro-esophageal reflux disease with esophagitis, without bleeding: Secondary | ICD-10-CM | POA: Diagnosis not present

## 2021-01-07 DIAGNOSIS — I771 Stricture of artery: Secondary | ICD-10-CM | POA: Diagnosis not present

## 2021-01-07 DIAGNOSIS — R051 Acute cough: Secondary | ICD-10-CM | POA: Diagnosis not present

## 2021-01-07 NOTE — Progress Notes (Signed)
Subjective:    Patient ID: Diana [name], female    DOB: 1950/12/23, 70 y.o.   MRN: [id number]  HPI  70 year old female who  has a past medical history of ADD (attention deficit disorder), Anxiety, Cancer ([place]), Depression, Diverticulosis, DJD (degenerative joint disease), GERD (gastroesophageal reflux disease), History of hiatal hernia, Hypertension, Mood disorder ([place]), SCC (squamous cell carcinoma) in situ (04/22/2010), and STD (sexually transmitted disease).  She presents to the office today for follow up after being seen in at [facility] on 01/02/2021 for one week of cough and wheezing. Prior she felt as though she has having a " bad acid reflux attack" which was causing her to cough, coughing continued so she decided to go to urgent care.  She had a chest x-ray which showed a torturous aorta, peribronchial thickening, and osteoporosis and degenerative changes in the spine.  She was placed on a Z -pack   Today she reports that her cough has gotten better, she continues to have issues with acid reflux, has switched from Prilosec to Nexium and has found Nexium more helpful.  She is more concerned about the incidental finding of a torturous aorta  She denies symptoms such as chest pain, palpitations, shortness of breath.  Does report a significant family history of vascular issues.  She is wondering if any further imaging should be done at this time.   Review of Systems See HPI   Past Medical History:  Diagnosis Date   ADD (attention deficit disorder)    Anxiety    Cancer (HCC)    skin   Depression    Diverticulosis    DJD (degenerative joint disease)    GERD (gastroesophageal reflux disease)    History of hiatal hernia    Hypertension    Mood disorder (HCC)    SCC (squamous cell carcinoma) in situ 04/22/2010   right forearm   STD (sexually transmitted disease)    HSV II    Social History   Socioeconomic History   Marital status: Married    Spouse name: Not on file   Number  of children: 1   Years of education: Not on file   Highest education level: Not on file  Occupational History   Not on file  Tobacco Use   Smoking status: Former    Packs/day: 1.00    Years: 20.00    Pack years: 20.00    Types: Cigarettes    Quit date: 06/29/2006    Years since quitting: 14.5   Smokeless tobacco: Never  Vaping Use   Vaping Use: Never used  Substance and Sexual Activity   Alcohol use: No    Alcohol/week: 0.0 standard drinks    Comment: occ beer   Drug use: No   Sexual activity: Not Currently    Birth control/protection: Post-menopausal  Other Topics Concern   Not on file  Social History Narrative   Not on file   Social Determinants of Health   Financial Resource Strain: Not on file  Food Insecurity: Not on file  Transportation Needs: Not on file  Physical Activity: Not on file  Stress: Not on file  Social Connections: Not on file  Intimate Partner Violence: Not on file    Past Surgical History:  Procedure Laterality Date   ABDOMINAL HYSTERECTOMY     partial   BREAST IMPLANT REMOVAL     CESAREAN SECTION     COLON SURGERY     had about 1 foot of bowel removed  HEMORRHOID SURGERY     LIPOMA EXCISION N/A 10/23/2014   Procedure: EXCISION OF BACK LIPOMA;  Surgeon: Donnie Mesa, MD;  Location: [facility];  Service: General;  Laterality: N/A;   PARTIAL COLECTOMY     PARTIAL HYSTERECTOMY     still has ovaries    PILONIDAL CYST EXCISION     PLACEMENT OF BREAST IMPLANTS     RHINOPLASTY     x 2   SHOULDER SURGERY Right    06    Family History  Problem Relation Age of Onset   Melanoma Mother    Kidney cancer Father    Diabetes Father    Breast cancer Paternal Grandmother    Cancer Maternal Grandmother        possible uterine?     Allergies  Allergen Reactions   Ezetimibe     REACTION: statins make joints ache   Latex Hives   Morphine Sulfate     REACTION: n \\T \ v    Current Outpatient Medications on File Prior to Visit  Medication Sig  Dispense Refill   Alum Hydroxide-Mag Carbonate (GAVISCON PO) Take by mouth as needed.      Ascorbic Acid (VITAMIN C WITH ROSE HIPS) 500 MG tablet Take 500 mg by mouth daily.     Cholecalciferol (VITAMIN D3) 50 MCG (2000 UT) TABS Take 1 capsule by mouth daily.      esomeprazole (NEXIUM) 10 MG packet Take 10 mg by mouth daily before breakfast.     fenofibrate (TRICOR) 145 MG tablet TAKE 1 TABLET BY MOUTH EVERY DAY 90 tablet 0   ibuprofen (ADVIL,MOTRIN) 200 MG tablet Take 400 mg by mouth every 6 (six) hours as needed.     Multiple Vitamins-Minerals (ZINC PO) Take 20 mg by mouth daily.     Omega-3 Fatty Acids (OMEGA 3 PO) Take 1 capsule by mouth daily.     Probiotic Product (PROBIOTIC ADVANCED PO) Take 1 capsule by mouth daily.     SYNTHROID 50 MCG tablet TAKE 1 TABLET BY MOUTH DAILY BEFORE BREAKFAST 90 tablet 3   vitamin B-12 (CYANOCOBALAMIN) 1000 MCG tablet Take 1,000 mcg by mouth daily.     No current facility-administered medications on file prior to visit.    BP 110/80    Pulse 73    Temp 98.6 F (37 C) (Oral)    Ht 4\' 11"  (1.499 m)    Wt 146 lb (66.2 kg)    SpO2 97%    BMI 29.49 kg/m       Objective:   Physical Exam Vitals and nursing note reviewed.  Constitutional:      Appearance: Normal appearance.  Cardiovascular:     Rate and Rhythm: Normal rate and regular rhythm.     Pulses: Normal pulses.     Heart sounds: Normal heart sounds.  Pulmonary:     Effort: Pulmonary effort is normal.     Breath sounds: Normal breath sounds.  Skin:    General: Skin is warm and dry.     Capillary Refill: Capillary refill takes less than 2 seconds.  Neurological:     General: No focal deficit present.     Mental Status: She is alert and oriented to person, place, and time.  Psychiatric:        Mood and Affect: Mood normal.        Behavior: Behavior normal.        Thought Content: Thought content normal.        Judgment: Judgment normal.  Assessment & Plan:  1. Tortuous aorta  (HCC) - asymptomatic  - US AORTA; Future  2. Gastroesophageal reflux disease with esophagitis without hemorrhage - Continue with Nexium. Would like to hold off on being referred back to GI   3. Acute cough - Resolving   Dorothyann Peng, NP

## 2021-01-15 ENCOUNTER — Other Ambulatory Visit: Payer: Self-pay | Admitting: Adult Health

## 2021-01-20 ENCOUNTER — Ambulatory Visit (INDEPENDENT_AMBULATORY_CARE_PROVIDER_SITE_OTHER): Payer: PPO

## 2021-01-20 VITALS — Ht 59.0 in | Wt 146.0 lb

## 2021-01-20 DIAGNOSIS — Z0000 Encounter for general adult medical examination without abnormal findings: Secondary | ICD-10-CM | POA: Diagnosis not present

## 2021-01-20 NOTE — Progress Notes (Signed)
Subjective:   Diana [name] is a 71 y.o. female who presents for Medicare Annual (Subsequent) preventive examination.  Review of Systems    No ROS Cardiac Risk Factors include: advanced age (>4men, >62 women);hypertension    Objective:    Today's Vitals   01/20/21 1115  Weight: 146 lb (66.2 kg)  Height: 4\' 11"  (1.499 m)   Body mass index is 29.49 kg/m.  Advanced Directives 01/20/2021 04/19/2020 01/01/2020 10/16/2015 04/01/2015 10/23/2014 10/21/2014  Does Patient Have a Medical Advance Directive? Yes Yes Yes Yes Yes - Yes  Type of Advance Directive [place];Living will Living will;Healthcare Power of [place];Living will [place];Living will [place];Living will - Living will;Healthcare Power of Attorney  Does patient want to make changes to medical advance directive? No - Patient declined - No - Patient declined No - Patient declined - - No - Patient declined  Copy of [place] in Chart? No - copy requested - No - copy requested Yes - No - copy requested No - copy requested    Current Medications (verified) Outpatient Encounter Medications as of 01/20/2021  Medication Sig   Alum Hydroxide-Mag Carbonate (GAVISCON PO) Take by mouth as needed.    Ascorbic Acid (VITAMIN C WITH ROSE HIPS) 500 MG tablet Take 500 mg by mouth daily.   Cholecalciferol (VITAMIN D3) 50 MCG (2000 UT) TABS Take 1 capsule by mouth daily.    esomeprazole (NEXIUM) 10 MG packet Take 10 mg by mouth daily before breakfast.   fenofibrate (TRICOR) 145 MG tablet TAKE 1 TABLET BY MOUTH EVERY DAY   ibuprofen (ADVIL,MOTRIN) 200 MG tablet Take 400 mg by mouth every 6 (six) hours as needed.   Multiple Vitamins-Minerals (ZINC PO) Take 20 mg by mouth daily.   Omega-3 Fatty Acids (OMEGA 3 PO) Take 1 capsule by mouth daily.   Probiotic Product (PROBIOTIC ADVANCED PO) Take 1 capsule by mouth daily.   SYNTHROID 50 MCG tablet  Take 1 tablet (50 mcg total) by mouth daily before breakfast. NEEDS APPT FOR REFILLS   vitamin B-12 (CYANOCOBALAMIN) 1000 MCG tablet Take 1,000 mcg by mouth daily.   No facility-administered encounter medications on file as of 01/20/2021.    Allergies (verified) Ezetimibe, Latex, and Morphine sulfate   History: Past Medical History:  Diagnosis Date   ADD (attention deficit disorder)    Anxiety    Cancer (HCC)    skin   Depression    Diverticulosis    DJD (degenerative joint disease)    GERD (gastroesophageal reflux disease)    History of hiatal hernia    Hypertension    Mood disorder (HCC)    SCC (squamous cell carcinoma) in situ 04/22/2010   right forearm   STD (sexually transmitted disease)    HSV II   Past Surgical History:  Procedure Laterality Date   ABDOMINAL HYSTERECTOMY     partial   BREAST IMPLANT REMOVAL     CESAREAN SECTION     COLON SURGERY     had about 1 foot of bowel removed    HEMORRHOID SURGERY     LIPOMA EXCISION N/A 10/23/2014   Procedure: EXCISION OF BACK LIPOMA;  Surgeon: Donnie Mesa, MD;  Location: [place];  Service: General;  Laterality: N/A;   PARTIAL COLECTOMY     PARTIAL HYSTERECTOMY     still has ovaries    PILONIDAL CYST EXCISION     PLACEMENT OF BREAST IMPLANTS  RHINOPLASTY     x 2   SHOULDER SURGERY Right    06   Family History  Problem Relation Age of Onset   Melanoma Mother    Kidney cancer Father    Diabetes Father    Breast cancer Paternal Grandmother    Cancer Maternal Grandmother        possible uterine?    Social History   Socioeconomic History   Marital status: Married    Spouse name: Not on file   Number of children: 1   Years of education: Not on file   Highest education level: Not on file  Occupational History   Not on file  Tobacco Use   Smoking status: Former    Packs/day: 1.00    Years: 20.00    Pack years: 20.00    Types: Cigarettes    Quit date: 06/29/2006    Years since quitting: 14.5   Smokeless  tobacco: Never  Vaping Use   Vaping Use: Never used  Substance and Sexual Activity   Alcohol use: No    Alcohol/week: 0.0 standard drinks    Comment: occ beer   Drug use: No   Sexual activity: Not Currently    Birth control/protection: Post-menopausal  Other Topics Concern   Not on file  Social History Narrative   Not on file   Social Determinants of Health   Financial Resource Strain: Not on file  Food Insecurity: Not on file  Transportation Needs: Not on file  Physical Activity: Not on file  Stress: Not on file  Social Connections: Socially Integrated   Frequency of Communication with Friends and Family: More than three times a week   Frequency of Social Gatherings with Friends and Family: More than three times a week   Attends Religious Services: More than 4 times per year   Active Member of [employer] or Organizations: Yes   Attends Archivist Meetings: More than 4 times per year   Marital Status: Married     Clinical Intake:    How often do you need to have someone help you when you read instructions, pamphlets, or other written materials from your doctor or pharmacy?: (P) 1 - Never  Diabetic? No    Activities of Daily Living In your present state of health, do you have any difficulty performing the following activities: 01/20/2021  Hearing? N  Vision? N  Difficulty concentrating or making decisions? N  Walking or climbing stairs? N  Dressing or bathing? N  Doing errands, shopping? N  Preparing Food and eating ? N  Using the Toilet? N  In the past six months, have you accidently leaked urine? N  Do you have problems with loss of bowel control? N  Managing your Medications? N  Managing your Finances? N  Housekeeping or managing your Housekeeping? N  Some recent data might be hidden    Patient Care Team: Dorothyann Peng, NP as PCP - General (Family Medicine) Roseanne Kaufman, MD as Consulting Physician (Orthopedic Surgery) Viona Gilmore, [facility] as  Pharmacist (Pharmacist)  Indicate any recent Medical Services you may have received from other than [facility] providers in the past year (date may be approximate).     Assessment:   This is a routine wellness examination for Diana [name]. Virtual Visit via Telephone Note  I connected with  Diana [name] on 01/20/21 at 11:15 AM EST by telephone and verified that I am speaking with the correct person using two identifiers.  Location: Patient: Home Provider:  Office Persons participating in the virtual visit: [place]   I discussed the limitations, risks, security and privacy concerns of performing an evaluation and management service by telephone and the availability of in person appointments. The patient expressed understanding and agreed to proceed.  Interactive audio and video telecommunications were attempted between this nurse and patient, however failed, due to patient having technical difficulties OR patient did not have access to video capability.  We continued and completed visit with audio only.  Some vital signs may be absent or patient reported.   Criselda Peaches, LPN   Hearing/Vision screen Hearing Screening - Comments:: No difficulty hearing Vision Screening - Comments:: Wears glasses. Followed by Dr Jabier Mutton  Dietary issues and exercise activities discussed: Current Exercise Habits: Home exercise routine, Type of exercise: walking, Time (Minutes): 60, Frequency (Times/Week): 5, Weekly Exercise (Minutes/Week): 300, Intensity: Moderate   Goals Addressed             This Visit's Progress    Exercise 150 min/wk Moderate Activity       Maintain current lifestyle exercise 150/wk.       Depression Screen PHQ 2/9 Scores 01/20/2021 01/07/2021 01/01/2020 10/27/2017 09/02/2014  PHQ - 2 Score 0 0 0 0 0  PHQ- 9 Score 0 6 0 - -    Fall Risk Fall Risk  01/20/2021 01/07/2021 01/01/2020 10/27/2017 09/02/2014  Falls in the past year? 0 0 0 No No  Number falls in past  yr: 0 0 0 - -  Injury with Fall? 0 0 0 - -  Risk for fall due to : - - No Fall Risks - -  Follow up - - Falls evaluation completed;Falls prevention discussed - -    FALL RISK PREVENTION PERTAINING TO THE HOME:  Any stairs in or around the home? Yes  If so, are there any without handrails? No Home free of loose throw rugs in walkways, pet beds, electrical cords, etc? Yes  Adequate lighting in your home to reduce risk of falls? Yes   ASSISTIVE DEVICES UTILIZED TO PREVENT FALLS:  Life alert? No  Use of a cane, walker or w/c? No  Grab bars in the bathroom? Yes  Shower chair or bench in shower? Yes  Elevated toilet seat or a handicapped toilet? No   TIMED UP AND GO:  Was the test performed? No . Audio Visit  Cognitive Function:      Immunizations Immunization History  Administered Date(s) Administered   Fluad Quad(high Dose 65+) 10/26/2018   Influenza Split 09/28/2012   Influenza Whole 11/05/2004, 09/18/2008   Influenza, High Dose Seasonal PF 09/02/2014, 10/13/2015, 11/18/2016, 10/20/2017, 10/29/2019, 10/06/2020   Influenza-Unspecified 10/18/2013   PFIZER(Purple Top)SARS-COV-2 Vaccination 08/18/2019, 09/08/2019   Pneumococcal Conjugate-13 05/05/2015   Pneumococcal Polysaccharide-23 03/21/2007, 02/11/2017   Td 01/19/2003   Tdap 05/05/2015   Zoster, Live 03/27/2013    Covid-19 vaccine status: Information provided on how to obtain vaccines.   Qualifies for Shingles Vaccine? Yes   Zostavax completed No   Shingrix Completed?: No.    Education has been provided regarding the importance of this vaccine. Patient has been advised to call insurance company to determine out of pocket expense if they have not yet received this vaccine. Advised may also receive vaccine at local pharmacy or Health Dept. Verbalized acceptance and understanding.  Screening Tests Health Maintenance  Topic Date Due   COVID-19 Vaccine (3 - Pfizer risk series) 02/05/2021 (Originally 10/06/2019)   Zoster  Vaccines- Shingrix (1 of 2) 04/20/2021 (  Originally 04/07/1969)   MAMMOGRAM  07/10/2021   COLONOSCOPY (Pts 45-71yrs Insurance coverage will need to be confirmed)  03/31/2025   TETANUS/TDAP  05/04/2025   Pneumonia Vaccine 90+ Years old  Completed   INFLUENZA VACCINE  Completed   DEXA SCAN  Completed   Hepatitis C Screening  Completed   HPV VACCINES  Aged Out    Health Maintenance  There are no preventive care reminders to display for this patient.  Additional Screening:  Vision Screening: Recommended annual ophthalmology exams for early detection of glaucoma and other disorders of the eye. Is the patient up to date with their annual eye exam?  Yes  Who is the provider or what is the name of the office in which the patient attends annual eye exams? Followed by Dr Jabier Mutton   Dental Screening: Recommended annual dental exams for proper oral hygiene  Community Resource Referral / Chronic Care Management:  CRR required this visit?  No   CCM required this visit?  No      Plan:     I have personally reviewed and noted the following in the patients chart:   Medical and social history Use of alcohol, tobacco or illicit drugs  Current medications and supplements including opioid prescriptions. Patient currently not taking opioids. Functional ability and status Nutritional status Physical activity Advanced directives List of other physicians Hospitalizations, surgeries, and ER visits in previous 12 months Vitals Screenings to include cognitive, depression, and falls Referrals and appointments  In addition, I have reviewed and discussed with patient certain preventive protocols, quality metrics, and best practice recommendations. A written personalized care plan for preventive services as well as general preventive health recommendations were provided to patient.     Criselda Peaches, LPN   01/23/1759

## 2021-01-20 NOTE — Patient Instructions (Addendum)
[name] , Thank you for taking time to come for your Medicare Wellness Visit. I appreciate your ongoing commitment to your health goals. Please review the following plan we discussed and let me know if I can assist you in the future.   These are the goals we discussed:  Goals      Exercise 150 min/wk Moderate Activity     Maintain current lifestyle exercise 150/wk.        This is a list of the screening recommended for you and due dates:  Health Maintenance  Topic Date Due   COVID-19 Vaccine (3 - Pfizer risk series) 02/05/2021*   Zoster (Shingles) Vaccine (1 of 2) 04/20/2021*   Mammogram  07/10/2021   Colon Cancer Screening  03/31/2025   Tetanus Vaccine  05/04/2025   Pneumonia Vaccine  Completed   Flu Shot  Completed   DEXA scan (bone density measurement)  Completed   Hepatitis C Screening: USPSTF Recommendation to screen - Ages 13-79 yo.  Completed   HPV Vaccine  Aged Out  *Topic was postponed. The date shown is not the original due date.   Advanced directives: Yes  Conditions/risks identified: None  Next appointment: Follow up in one year for your annual wellness visit    Preventive Care 65 Years and Older, Female Preventive care refers to lifestyle choices and visits with your health care provider that can promote health and wellness. What does preventive care include? A yearly physical exam. This is also called an annual well check. Dental exams once or twice a year. Routine eye exams. Ask your health care provider how often you should have your eyes checked. Personal lifestyle choices, including: Daily care of your teeth and gums. Regular physical activity. Eating a healthy diet. Avoiding tobacco and drug use. Limiting alcohol use. Practicing safe sex. Taking low-dose aspirin every day. Taking vitamin and mineral supplements as recommended by your health care provider. What happens during an annual well check? The services and screenings done by your  health care provider during your annual well check will depend on your age, overall health, lifestyle risk factors, and family history of disease. Counseling  Your health care provider may ask you questions about your: Alcohol use. Tobacco use. Drug use. Emotional well-being. Home and relationship well-being. Sexual activity. Eating habits. History of falls. Memory and ability to understand (cognition). Work and work Statistician. Reproductive health. Screening  You may have the following tests or measurements: Height, weight, and BMI. Blood pressure. Lipid and cholesterol levels. These may be checked every 5 years, or more frequently if you are over 85 years old. Skin check. Lung cancer screening. You may have this screening every year starting at age 73 if you have a 30-pack-year history of smoking and currently smoke or have quit within the past 15 years. Fecal occult blood test (FOBT) of the stool. You may have this test every year starting at age 73. Flexible sigmoidoscopy or colonoscopy. You may have a sigmoidoscopy every 5 years or a colonoscopy every 10 years starting at age 73. Hepatitis C blood test. Hepatitis B blood test. Sexually transmitted disease (STD) testing. Diabetes screening. This is done by checking your blood sugar (glucose) after you have not eaten for a while (fasting). You may have this done every 1-3 years. Bone density scan. This is done to screen for osteoporosis. You may have this done starting at age 73. Mammogram. This may be done every 1-2 years. Talk to your health care provider about how often  you should have regular mammograms. Talk with your health care provider about your test results, treatment options, and if necessary, the need for more tests. Vaccines  Your health care provider may recommend certain vaccines, such as: Influenza vaccine. This is recommended every year. Tetanus, diphtheria, and acellular pertussis (Tdap, Td) vaccine. You may  need a Td booster every 10 years. Zoster vaccine. You may need this after age 73. Pneumococcal 13-valent conjugate (PCV13) vaccine. One dose is recommended after age 73. Pneumococcal polysaccharide (PPSV23) vaccine. One dose is recommended after age 73. Talk to your health care provider about which screenings and vaccines you need and how often you need them. This information is not intended to replace advice given to you by your health care provider. Make sure you discuss any questions you have with your health care provider. Document Released: 01/31/2015 Document Revised: 09/24/2015 Document Reviewed: 11/05/2014 Elsevier Interactive Patient Education  2017 [place] Prevention in the Home Falls can cause injuries. They can happen to people of all ages. There are many things you can do to make your home safe and to help prevent falls. What can I do on the outside of my home? Regularly fix the edges of walkways and driveways and fix any cracks. Remove anything that might make you trip as you walk through a door, such as a raised step or threshold. Trim any bushes or trees on the path to your home. Use bright outdoor lighting. Clear any walking paths of anything that might make someone trip, such as rocks or tools. Regularly check to see if handrails are loose or broken. Make sure that both sides of any steps have handrails. Any raised decks and porches should have guardrails on the edges. Have any leaves, snow, or ice cleared regularly. Use sand or salt on walking paths during winter. Clean up any spills in your garage right away. This includes oil or grease spills. What can I do in the bathroom? Use night lights. Install grab bars by the toilet and in the tub and shower. Do not use towel bars as grab bars. Use non-skid mats or decals in the tub or shower. If you need to sit down in the shower, use a plastic, non-slip stool. Keep the floor dry. Clean up any water that spills on  the floor as soon as it happens. Remove soap buildup in the tub or shower regularly. Attach bath mats securely with double-sided non-slip rug tape. Do not have throw rugs and other things on the floor that can make you trip. What can I do in the bedroom? Use night lights. Make sure that you have a light by your bed that is easy to reach. Do not use any sheets or blankets that are too big for your bed. They should not hang down onto the floor. Have a firm chair that has side arms. You can use this for support while you get dressed. Do not have throw rugs and other things on the floor that can make you trip. What can I do in the kitchen? Clean up any spills right away. Avoid walking on wet floors. Keep items that you use a lot in easy-to-reach places. If you need to reach something above you, use a strong step stool that has a grab bar. Keep electrical cords out of the way. Do not use floor polish or wax that makes floors slippery. If you must use wax, use non-skid floor wax. Do not have throw rugs and other things on  the floor that can make you trip. What can I do with my stairs? Do not leave any items on the stairs. Make sure that there are handrails on both sides of the stairs and use them. Fix handrails that are broken or loose. Make sure that handrails are as long as the stairways. Check any carpeting to make sure that it is firmly attached to the stairs. Fix any carpet that is loose or worn. Avoid having throw rugs at the top or bottom of the stairs. If you do have throw rugs, attach them to the floor with carpet tape. Make sure that you have a light switch at the top of the stairs and the bottom of the stairs. If you do not have them, ask someone to add them for you. What else can I do to help prevent falls? Wear shoes that: Do not have high heels. Have rubber bottoms. Are comfortable and fit you well. Are closed at the toe. Do not wear sandals. If you use a stepladder: Make sure  that it is fully opened. Do not climb a closed stepladder. Make sure that both sides of the stepladder are locked into place. Ask someone to hold it for you, if possible. Clearly mark and make sure that you can see: Any grab bars or handrails. First and last steps. Where the edge of each step is. Use tools that help you move around (mobility aids) if they are needed. These include: Canes. Walkers. Scooters. Crutches. Turn on the lights when you go into a dark area. Replace any light bulbs as soon as they burn out. Set up your furniture so you have a clear path. Avoid moving your furniture around. If any of your floors are uneven, fix them. If there are any pets around you, be aware of where they are. Review your medicines with your doctor. Some medicines can make you feel dizzy. This can increase your chance of falling. Ask your doctor what other things that you can do to help prevent falls. This information is not intended to replace advice given to you by your health care provider. Make sure you discuss any questions you have with your health care provider. Document Released: 10/31/2008 Document Revised: 06/12/2015 Document Reviewed: 02/08/2014 Elsevier Interactive Patient Education  2017 [employer].

## 2021-01-21 ENCOUNTER — Other Ambulatory Visit: Payer: PPO

## 2021-01-28 ENCOUNTER — Ambulatory Visit
Admission: RE | Admit: 2021-01-28 | Discharge: 2021-01-28 | Disposition: A | Discharge status: Home / Self Care | Payer: PPO | Source: Ambulatory Visit | Attending: Adult Health | Admitting: Adult Health

## 2021-01-28 DIAGNOSIS — Q2546 Tortuous aortic arch: Secondary | ICD-10-CM | POA: Diagnosis not present

## 2021-01-28 DIAGNOSIS — I77811 Abdominal aortic ectasia: Secondary | ICD-10-CM | POA: Diagnosis not present

## 2021-01-28 DIAGNOSIS — I771 Stricture of artery: Secondary | ICD-10-CM

## 2021-02-12 DIAGNOSIS — F900 Attention-deficit hyperactivity disorder, predominantly inattentive type: Secondary | ICD-10-CM | POA: Diagnosis not present

## 2021-02-13 ENCOUNTER — Other Ambulatory Visit: Payer: Self-pay | Admitting: Adult Health

## 2021-02-13 ENCOUNTER — Other Ambulatory Visit: Payer: Self-pay

## 2021-02-13 ENCOUNTER — Telehealth: Payer: Self-pay | Admitting: Adult Health

## 2021-02-13 MED ORDER — SYNTHROID 50 MCG PO TABS: 50.0000 ug | ORAL_TABLET | Freq: Every day | ORAL | 0 refills | Status: DC

## 2021-02-13 NOTE — Telephone Encounter (Signed)
Pt was last seen on 01-07-2021 and would like a refill on SYNTHROID 50 MCG tablet sent to  [id number] [address], [place] Phone:  [phone]  Fax:  [phone]

## 2021-03-03 ENCOUNTER — Ambulatory Visit: Payer: PPO | Admitting: Adult Health

## 2021-03-17 ENCOUNTER — Encounter: Payer: Self-pay | Admitting: Adult Health

## 2021-03-17 ENCOUNTER — Ambulatory Visit (INDEPENDENT_AMBULATORY_CARE_PROVIDER_SITE_OTHER): Payer: PPO | Admitting: Adult Health

## 2021-03-17 ENCOUNTER — Telehealth: Payer: Self-pay | Admitting: Adult Health

## 2021-03-17 VITALS — BP 108/80 | HR 72 | Temp 98.9°F | Ht 59.0 in | Wt 142.0 lb

## 2021-03-17 DIAGNOSIS — E039 Hypothyroidism, unspecified: Secondary | ICD-10-CM | POA: Diagnosis not present

## 2021-03-17 DIAGNOSIS — K2100 Gastro-esophageal reflux disease with esophagitis, without bleeding: Secondary | ICD-10-CM | POA: Diagnosis not present

## 2021-03-17 DIAGNOSIS — Z0000 Encounter for general adult medical examination without abnormal findings: Secondary | ICD-10-CM

## 2021-03-17 DIAGNOSIS — F902 Attention-deficit hyperactivity disorder, combined type: Secondary | ICD-10-CM | POA: Insufficient documentation

## 2021-03-17 DIAGNOSIS — R7303 Prediabetes: Secondary | ICD-10-CM

## 2021-03-17 DIAGNOSIS — E559 Vitamin D deficiency, unspecified: Secondary | ICD-10-CM

## 2021-03-17 DIAGNOSIS — E7800 Pure hypercholesterolemia, unspecified: Secondary | ICD-10-CM | POA: Diagnosis not present

## 2021-03-17 LAB — LIPID PANEL
Cholesterol: 184 mg/dL (ref 0–200)
HDL: 47.2 mg/dL (ref >39.00)
LDL Cholesterol: 109 mg/dL — ABNORMAL HIGH (ref 0–99)
NonHDL: 137
Total CHOL/HDL Ratio: 4
Triglycerides: 142 mg/dL (ref 0.0–149.0)
VLDL: 28.4 mg/dL (ref 0.0–40.0)

## 2021-03-17 LAB — CBC WITH DIFFERENTIAL/PLATELET
Basophils Absolute: 0 10*3/uL (ref 0.0–0.1)
Basophils Relative: 0.6 % (ref 0.0–3.0)
Eosinophils Absolute: 0.1 10*3/uL (ref 0.0–0.7)
Eosinophils Relative: 2 % (ref 0.0–5.0)
HCT: 41.8 % (ref 36.0–46.0)
Hemoglobin: 13.7 g/dL (ref 12.0–15.0)
Lymphocytes Relative: 45 % (ref 12.0–46.0)
Lymphs Abs: 1.8 10*3/uL (ref 0.7–4.0)
MCHC: 32.9 g/dL (ref 30.0–36.0)
MCV: 84 fl (ref 78.0–100.0)
Monocytes Absolute: 0.2 10*3/uL (ref 0.1–1.0)
Monocytes Relative: 6.1 % (ref 3.0–12.0)
Neutro Abs: 1.9 10*3/uL (ref 1.4–7.7)
Neutrophils Relative %: 46.3 % (ref 43.0–77.0)
Platelets: 241 10*3/uL (ref 150.0–400.0)
RBC: 4.98 Mil/uL (ref 3.87–5.11)
RDW: 14.4 % (ref 11.5–15.5)
WBC: 4 10*3/uL (ref 4.0–10.5)

## 2021-03-17 LAB — COMPREHENSIVE METABOLIC PANEL
ALT: 23 U/L (ref 0–35)
AST: 33 U/L (ref 0–37)
Albumin: 4.9 g/dL (ref 3.5–5.2)
Alkaline Phosphatase: 78 U/L (ref 39–117)
BUN: 16 mg/dL (ref 6–23)
CO2: 28 mEq/L (ref 19–32)
Calcium: 10.4 mg/dL (ref 8.4–10.5)
Chloride: 102 mEq/L (ref 96–112)
Creatinine, Ser: 0.88 mg/dL (ref 0.40–1.20)
GFR: 65.41 mL/min (ref >60.00)
Glucose, Bld: 101 mg/dL — ABNORMAL HIGH (ref 70–99)
Potassium: 4.1 mEq/L (ref 3.5–5.1)
Sodium: 139 mEq/L (ref 135–145)
Total Bilirubin: 0.5 mg/dL (ref 0.2–1.2)
Total Protein: 8.1 g/dL (ref 6.0–8.3)

## 2021-03-17 LAB — TSH: TSH: 1.97 u[IU]/mL (ref 0.35–5.50)

## 2021-03-17 LAB — VITAMIN D 25 HYDROXY (VIT D DEFICIENCY, FRACTURES): VITD: 44.4 ng/mL (ref 30.00–100.00)

## 2021-03-17 LAB — HEMOGLOBIN A1C: Hgb A1c MFr Bld: 6.4 % (ref 4.6–6.5)

## 2021-03-17 MED ORDER — FENOFIBRATE 145 MG PO TABS: 145.0000 mg | ORAL_TABLET | Freq: Every day | ORAL | 3 refills | Status: DC

## 2021-03-17 MED ORDER — METFORMIN HCL 500 MG PO TABS: 500.0000 mg | ORAL_TABLET | Freq: Every day | ORAL | 0 refills | Status: DC

## 2021-03-17 NOTE — Telephone Encounter (Signed)
Entered in error

## 2021-03-17 NOTE — Progress Notes (Signed)
Subjective:    Patient ID: Diana [name], female    DOB: 1950/03/15, 71 y.o.   MRN: [id number]  HPI Patient presents for yearly preventative medicine examination. She is a pleasant 71 year old female who  has a past medical history of ADD (attention deficit disorder), Anxiety, Cancer ([place]), Depression, Diverticulosis, DJD (degenerative joint disease), GERD (gastroesophageal reflux disease), History of hiatal hernia, Hypertension, Mood disorder ([place]), SCC (squamous cell carcinoma) in situ (04/22/2010), and STD (sexually transmitted disease).  Hyperlipidemia-currently prescribed Tricor 145 mg daily.  She has been intolerant to many statins in the past.  She denies myalgia or fatigue  Lab Results  Component Value Date   CHOL 189 01/10/2020   HDL 52 01/10/2020   LDLCALC 114 (H) 01/10/2020   LDLDIRECT 137.0 01/02/2019   TRIG 120 01/10/2020   CHOLHDL 3.6 01/10/2020   Pre Diabetes -last A1c was 6.2.  Not currently on any medications.  Is working on lifestyle modifications. Lab Results  Component Value Date   HGBA1C 6.2 (A) 04/29/2020   Hypothyroidism-currently controlled with Synthroid 50 mcg daily.  She does feel well controlled  Lab Results  Component Value Date   TSH 1.57 01/10/2020   Vitamin D Deficiency - takes Vitamin D 2000 units daily   GERD- takes Nexium 10 mg PRN   All immunizations and health maintenance protocols were reviewed with the patient and needed orders were placed.  Appropriate screening laboratory values were ordered for the patient including screening of hyperlipidemia, renal function and hepatic function.  Medication reconciliation,  past medical history, social history, problem list and allergies were reviewed in detail with the patient  Goals were established with regard to weight loss, exercise, and  diet in compliance with medications. She is walking daily and eating healthy.   Wt Readings from Last 3 Encounters:  03/17/21 142 lb (64.4 kg)   01/20/21 146 lb (66.2 kg)  01/07/21 146 lb (66.2 kg)   Review of Systems  Constitutional: Negative.   HENT: Negative.    Eyes: Negative.   Respiratory: Negative.    Cardiovascular: Negative.   Gastrointestinal: Negative.   Endocrine: Negative.   Genitourinary: Negative.   Musculoskeletal: Negative.   Skin: Negative.   Allergic/Immunologic: Negative.   Neurological: Negative.   Hematological: Negative.   Psychiatric/Behavioral: Negative.     Past Medical History:  Diagnosis Date   ADD (attention deficit disorder)    Anxiety    Cancer ([facility])    skin   Depression    Diverticulosis    DJD (degenerative joint disease)    GERD (gastroesophageal reflux disease)    History of hiatal hernia    Hypertension    Mood disorder (HCC)    SCC (squamous cell carcinoma) in situ 04/22/2010   right forearm   STD (sexually transmitted disease)    HSV II    Social History   Socioeconomic History   Marital status: Married    Spouse name: Not on file   Number of children: 1   Years of education: Not on file   Highest education level: Some college, no degree  Occupational History   Not on file  Tobacco Use   Smoking status: Former    Packs/day: 1.00    Years: 20.00    Pack years: 20.00    Types: Cigarettes    Quit date: 06/29/2006    Years since quitting: 14.7   Smokeless tobacco: Never  Vaping Use   Vaping Use: Never used  Substance and Sexual  Activity   Alcohol use: No    Alcohol/week: 0.0 standard drinks    Comment: occ beer   Drug use: No   Sexual activity: Not Currently    Birth control/protection: Post-menopausal  Other Topics Concern   Not on file  Social History Narrative   Not on file   Social Determinants of Health   Financial Resource Strain: Low Risk    Difficulty of Paying Living Expenses: Not hard at all  Food Insecurity: No Food Insecurity   Worried About Charity fundraiser in the Last Year: Never true   Ran Out of Food in the Last Year: Never true   Transportation Needs: No Transportation Needs   Lack of Transportation (Medical): No   Lack of Transportation (Non-Medical): No  Physical Activity: Sufficiently Active   Days of Exercise per Week: 5 days   Minutes of Exercise per Session: 50 min  Stress: No Stress Concern Present   Feeling of Stress : Only a little  Social Connections: Engineer, building services of Communication with Friends and Family: More than three times a week   Frequency of Social Gatherings with Friends and Family: Three times a week   Attends Religious Services: More than 4 times per year   Active Member of Clubs or Organizations: Yes   Attends Music therapist: More than 4 times per year   Marital Status: Married  Human resources officer Violence: Not At Risk   Fear of Current or Ex-Partner: No   Emotionally Abused: No   Physically Abused: No   Sexually Abused: No    Past Surgical History:  Procedure Laterality Date   ABDOMINAL HYSTERECTOMY     partial   BREAST IMPLANT REMOVAL     CESAREAN SECTION     COLON SURGERY     had about 1 foot of bowel removed    [place] N/A 10/23/2014   Procedure: EXCISION OF BACK LIPOMA;  Surgeon: Donnie Mesa, MD;  Location: [facility];  Service: General;  Laterality: N/A;   PARTIAL COLECTOMY     PARTIAL HYSTERECTOMY     still has ovaries    PILONIDAL CYST EXCISION     PLACEMENT OF BREAST IMPLANTS     RHINOPLASTY     x 2   SHOULDER SURGERY Right    06    Family History  Problem Relation Age of Onset   Melanoma Mother    Kidney cancer Father    Diabetes Father    Breast cancer Paternal Grandmother    Cancer Maternal Grandmother        possible uterine?     Allergies  Allergen Reactions   Ezetimibe     REACTION: statins make joints ache   Latex Hives   Morphine Sulfate     REACTION: n \\T \ v    Current Outpatient Medications on File Prior to Visit  Medication Sig Dispense Refill   Alum Hydroxide-Mag Carbonate  (GAVISCON PO) Take by mouth as needed.      Ascorbic Acid (VITAMIN C WITH ROSE HIPS) 500 MG tablet Take 500 mg by mouth daily.     Cholecalciferol (VITAMIN D3) 50 MCG (2000 UT) TABS Take 1 capsule by mouth daily.      esomeprazole (NEXIUM) 10 MG packet Take 10 mg by mouth daily before breakfast.     fenofibrate (TRICOR) 145 MG tablet TAKE 1 TABLET BY MOUTH EVERY DAY 90 tablet 0   ibuprofen (ADVIL,MOTRIN) 200 MG  tablet Take 400 mg by mouth every 6 (six) hours as needed.     Multiple Vitamins-Minerals (ZINC PO) Take 20 mg by mouth daily.     Omega-3 Fatty Acids (OMEGA 3 PO) Take 1 capsule by mouth daily.     Probiotic Product (PROBIOTIC ADVANCED PO) Take 1 capsule by mouth daily.     SYNTHROID 50 MCG tablet Take 1 tablet (50 mcg total) by mouth daily before breakfast. NEED PHYSICAL EXAM 90 tablet 0   vitamin B-12 (CYANOCOBALAMIN) 1000 MCG tablet Take 1,000 mcg by mouth daily.     No current facility-administered medications on file prior to visit.    There were no vitals taken for this visit.      Objective:   Physical Exam Vitals and nursing note reviewed.  Constitutional:      General: She is not in acute distress.    Appearance: Normal appearance. She is well-developed. She is not ill-appearing.  HENT:     Head: Normocephalic and atraumatic.     Right Ear: Tympanic membrane, ear canal and external ear normal. There is no impacted cerumen.     Left Ear: Tympanic membrane, ear canal and external ear normal. There is no impacted cerumen.     Nose: Nose normal. No congestion or rhinorrhea.     Mouth/Throat:     Mouth: Mucous membranes are moist.     Pharynx: Oropharynx is clear. No oropharyngeal exudate or posterior oropharyngeal erythema.  Eyes:     General:        Right eye: No discharge.        Left eye: No discharge.     Extraocular Movements: Extraocular movements intact.     Conjunctiva/sclera: Conjunctivae normal.     Pupils: Pupils are equal, round, and reactive to light.   Neck:     Thyroid: No thyromegaly.     Vascular: No carotid bruit.     Trachea: No tracheal deviation.  Cardiovascular:     Rate and Rhythm: Normal rate and regular rhythm.     Pulses: Normal pulses.     Heart sounds: Normal heart sounds. No murmur heard.   No friction rub. No gallop.  Pulmonary:     Effort: Pulmonary effort is normal. No respiratory distress.     Breath sounds: Normal breath sounds. No stridor. No wheezing, rhonchi or rales.  Chest:     Chest wall: No tenderness.  Abdominal:     General: Abdomen is flat. Bowel sounds are normal. There is no distension.     Palpations: Abdomen is soft. There is no mass.     Tenderness: There is no abdominal tenderness. There is no right CVA tenderness, left CVA tenderness, guarding or rebound.     Hernia: No hernia is present.  Musculoskeletal:        General: No swelling, tenderness, deformity or signs of injury. Normal range of motion.     Cervical back: Normal range of motion and neck supple.     Right lower leg: No edema.     Left lower leg: No edema.  Lymphadenopathy:     Cervical: No cervical adenopathy.  Skin:    General: Skin is warm and dry.     Coloration: Skin is not jaundiced or pale.     Findings: No bruising, erythema, lesion or rash.  Neurological:     General: No focal deficit present.     Mental Status: She is alert and oriented to person, place, and time.  Cranial Nerves: No cranial nerve deficit.     Sensory: No sensory deficit.     Motor: No weakness.     Coordination: Coordination normal.     Gait: Gait normal.     Deep Tendon Reflexes: Reflexes normal.  Psychiatric:        Mood and Affect: Mood normal.        Behavior: Behavior normal.        Thought Content: Thought content normal.        Judgment: Judgment normal.      Assessment & Plan:   1. Routine general medical examination at a health care facility - Benign exam  - Continue to work on lifestyle modifications  - CBC with  Differential/Platelet; Future - Comprehensive metabolic panel; Future - Hemoglobin A1c; Future - Lipid panel; Future - TSH; Future  2. Gastroesophageal reflux disease with esophagitis without hemorrhage - continue with Nexium 10 mg daily    3 Hypothyroidism, unspecified type - Consider dose change of synthroid  - CBC with Differential/Platelet; Future - Comprehensive metabolic panel; Future - Hemoglobin A1c; Future - Lipid panel; Future - TSH; Future 4. Pure hypercholesterolemia  - CBC with Differential/Platelet; Future - Comprehensive metabolic panel; Future - Hemoglobin A1c; Future - Lipid panel; Future - TSH; Future - fenofibrate (TRICOR) 145 MG tablet; Take 1 tablet (145 mg total) by mouth daily.  Dispense: 90 tablet; Refill: 3  5. Pre-diabetes - Consider metformin  - CBC with Differential/Platelet; Future - Comprehensive metabolic panel; Future - Hemoglobin A1c; Future - Lipid panel; Future - TSH; Future  6. Vitamin D deficiency - Consider dose change  - VITAMIN D 25 Hydroxy (Vit-D Deficiency, Fractures); Future  Dorothyann Peng, NP

## 2021-03-17 NOTE — Telephone Encounter (Signed)
Updated patient on her labs.  A1c was 6.4.  She does eat healthy and exercise.  Discussed that her pancreas is likely not making enough insulin.  Would like to put her on metformin 500 mg daily and retest in 3 months.  She is agreeable to this

## 2021-03-18 ENCOUNTER — Other Ambulatory Visit: Payer: Self-pay | Admitting: Adult Health

## 2021-03-18 DIAGNOSIS — E7800 Pure hypercholesterolemia, unspecified: Secondary | ICD-10-CM

## 2021-04-27 ENCOUNTER — Other Ambulatory Visit: Payer: Self-pay | Admitting: Adult Health

## 2021-04-27 DIAGNOSIS — K219 Gastro-esophageal reflux disease without esophagitis: Secondary | ICD-10-CM

## 2021-04-30 ENCOUNTER — Ambulatory Visit
Admission: RE | Admit: 2021-04-30 | Discharge: 2021-04-30 | Disposition: A | Discharge status: Home / Self Care | Payer: PPO | Source: Ambulatory Visit | Attending: Adult Health | Admitting: Adult Health

## 2021-04-30 DIAGNOSIS — M85852 Other specified disorders of bone density and structure, left thigh: Secondary | ICD-10-CM | POA: Diagnosis not present

## 2021-04-30 DIAGNOSIS — E2839 Other primary ovarian failure: Secondary | ICD-10-CM

## 2021-04-30 DIAGNOSIS — Z780 Asymptomatic menopausal state: Secondary | ICD-10-CM | POA: Diagnosis not present

## 2021-05-12 ENCOUNTER — Other Ambulatory Visit: Payer: Self-pay | Admitting: Adult Health

## 2021-05-12 DIAGNOSIS — K219 Gastro-esophageal reflux disease without esophagitis: Secondary | ICD-10-CM

## 2021-06-16 ENCOUNTER — Ambulatory Visit (INDEPENDENT_AMBULATORY_CARE_PROVIDER_SITE_OTHER): Payer: PPO | Admitting: Adult Health

## 2021-06-16 ENCOUNTER — Encounter: Payer: Self-pay | Admitting: Adult Health

## 2021-06-16 VITALS — BP 130/78 | HR 58 | Temp 98.3°F | Ht 59.0 in | Wt 142.0 lb

## 2021-06-16 DIAGNOSIS — F902 Attention-deficit hyperactivity disorder, combined type: Secondary | ICD-10-CM | POA: Diagnosis not present

## 2021-06-16 DIAGNOSIS — E039 Hypothyroidism, unspecified: Secondary | ICD-10-CM

## 2021-06-16 DIAGNOSIS — R7303 Prediabetes: Secondary | ICD-10-CM | POA: Diagnosis not present

## 2021-06-16 LAB — POCT GLYCOSYLATED HEMOGLOBIN (HGB A1C): HbA1c, POC (prediabetic range): 5.9 % (ref 5.7–6.4)

## 2021-06-16 MED ORDER — METFORMIN HCL 500 MG PO TABS
500.0000 mg | ORAL_TABLET | Freq: Every day | ORAL | 2 refills | Status: DC
Start: 2021-06-16 — End: 2022-03-09

## 2021-06-16 MED ORDER — SYNTHROID 50 MCG PO TABS: 50.0000 ug | ORAL_TABLET | Freq: Every day | ORAL | 3 refills | Status: DC

## 2021-06-16 MED ORDER — AMPHETAMINE-DEXTROAMPHETAMINE 20 MG PO TABS: 20.0000 mg | ORAL_TABLET | Freq: Every day | ORAL | 0 refills | Status: DC

## 2021-06-16 NOTE — Progress Notes (Signed)
Subjective:    Patient ID: Diana [name], female    DOB: [birth year], 71 y.o.   MRN: [id number]  HPI 71 year old female who  has a past medical history of ADD (attention deficit disorder), Cancer ([place]), Depression, Diverticulosis, DJD (degenerative joint disease), GERD (gastroesophageal reflux disease), History of hiatal hernia, Mood disorder ([place]), SCC (squamous cell carcinoma) in situ (04/22/2010), and STD (sexually transmitted disease).  He presents to the office today for 4-monthfollow-up regarding prediabetes.  During her last visit 3 months ago her A1c had increased from 6.2-6.4.  At this time we discussed starting metformin and she was agreeable.  She is currently managed with metformin 500 mg daily.  She reports no side effects of this medication and has been tolerating it well. She has been eating well and exercising more   Wt Readings from Last 3 Encounters:  06/16/21 142 lb (64.4 kg)  03/17/21 142 lb (64.4 kg)  01/20/21 146 lb (66.2 kg)   ADD - She was being seen in the past by psychiatry but does not plan to return to see them. She was on 30 mg XR but has not been able to find it d/t shortage. She is wondering if I would take over her Adderall prescription   Hypothyroidism - needs refill of synthroid    Review of Systems See HPI   Past Medical History:  Diagnosis Date   ADD (attention deficit disorder)    Cancer ([facility]CC)    skin   Depression    Diverticulosis    DJD (degenerative joint disease)    GERD (gastroesophageal reflux disease)    History of hiatal hernia    Mood disorder ([facility])    SCC (squamous cell carcinoma) in situ 04/22/2010   right forearm   STD (sexually transmitted disease)    HSV II    Social History   Socioeconomic History   Marital status: Married    Spouse name: Not on file   Number of children: 1   Years of education: Not on file   Highest education level: Some college, no degree  Occupational History   Not on file  Tobacco Use    Smoking status: Former    Packs/day: 1.00    Years: 20.00    Pack years: 20.00    Types: Cigarettes    Quit date: 06/29/2006    Years since quitting: 14.9   Smokeless tobacco: Never  Vaping Use   Vaping Use: Never used  Substance and Sexual Activity   Alcohol use: No    Alcohol/week: 0.0 standard drinks    Comment: occ beer   Drug use: No   Sexual activity: Not Currently    Birth control/protection: Post-menopausal  Other Topics Concern   Not on file  Social History Narrative   Not on file   Social Determinants of Health   Financial Resource Strain: Low Risk    Difficulty of Paying Living Expenses: Not hard at all  Food Insecurity: No Food Insecurity   Worried About RCharity fundraiserin the Last Year: Never true   Ran Out of Food in the Last Year: Never true  Transportation Needs: No Transportation Needs   Lack of Transportation (Medical): No   Lack of Transportation (Non-Medical): No  Physical Activity: Sufficiently Active   Days of Exercise per Week: 5 days   Minutes of Exercise per Session: 50 min  Stress: No Stress Concern Present   Feeling of Stress : Only a little  Social  Connections: Socially Integrated   Frequency of Communication with Friends and Family: More than three times a week   Frequency of Social Gatherings with Friends and Family: Three times a week   Attends Religious Services: More than 4 times per year   Active Member of Clubs or Organizations: Yes   Attends Music therapist: More than 4 times per year   Marital Status: Married  Human resources officer Violence: Not At Risk   Fear of Current or Ex-Partner: No   Emotionally Abused: No   Physically Abused: No   Sexually Abused: No    Past Surgical History:  Procedure Laterality Date   ABDOMINAL HYSTERECTOMY     partial   BREAST IMPLANT REMOVAL     CESAREAN SECTION     COLON SURGERY     had about 1 foot of bowel removed    [place] N/A 10/23/2014    Procedure: EXCISION OF BACK LIPOMA;  Surgeon: Donnie Mesa, MD;  Location: [facility];  Service: General;  Laterality: N/A;   PARTIAL COLECTOMY     PARTIAL HYSTERECTOMY     still has ovaries    PILONIDAL CYST EXCISION     PLACEMENT OF BREAST IMPLANTS     RHINOPLASTY     x 2   SHOULDER SURGERY Right    06    Family History  Problem Relation Age of Onset   Melanoma Mother    Kidney cancer Father    Diabetes Father    Breast cancer Paternal Grandmother    Cancer Maternal Grandmother        possible uterine?     Allergies  Allergen Reactions   Ezetimibe     REACTION: statins make joints ache   Latex Hives   Morphine Sulfate     REACTION: n' \\T'$ \ v    Current Outpatient Medications on File Prior to Visit  Medication Sig Dispense Refill   Alum Hydroxide-Mag Carbonate (GAVISCON PO) Take by mouth as needed.      Ascorbic Acid (VITAMIN C WITH ROSE HIPS) 500 MG tablet Take 500 mg by mouth daily.     Cholecalciferol (VITAMIN D3) 50 MCG (2000 UT) TABS Take 1 capsule by mouth daily.      esomeprazole (NEXIUM) 10 MG packet Take 10 mg by mouth daily before breakfast.     fenofibrate (TRICOR) 145 MG tablet Take 1 tablet (145 mg total) by mouth daily. 90 tablet 3   ibuprofen (ADVIL,MOTRIN) 200 MG tablet Take 400 mg by mouth every 6 (six) hours as needed.     metFORMIN (GLUCOPHAGE) 500 MG tablet Take 1 tablet (500 mg total) by mouth daily with breakfast. 90 tablet 0   Multiple Vitamins-Minerals (ZINC PO) Take 20 mg by mouth daily.     Omega-3 Fatty Acids (OMEGA 3 PO) Take 1 capsule by mouth daily.     Probiotic Product (PROBIOTIC ADVANCED PO) Take 1 capsule by mouth daily.     SYNTHROID 50 MCG tablet Take 1 tablet (50 mcg total) by mouth daily before breakfast. NEED PHYSICAL EXAM 90 tablet 0   vitamin B-12 (CYANOCOBALAMIN) 1000 MCG tablet Take 1,000 mcg by mouth daily.     No current facility-administered medications on file prior to visit.    There were no vitals taken for this visit.       Objective:   Physical Exam Vitals and nursing note reviewed.  Constitutional:      Appearance: Normal appearance.  Cardiovascular:  Rate and Rhythm: Normal rate and regular rhythm.     Pulses: Normal pulses.     Heart sounds: Normal heart sounds.  Pulmonary:     Breath sounds: Normal breath sounds.  Musculoskeletal:        General: Normal range of motion.  Skin:    General: Skin is warm and dry.  Neurological:     General: No focal deficit present.     Mental Status: She is alert and oriented to person, place, and time.  Psychiatric:        Mood and Affect: Mood normal.        Behavior: Behavior normal.        Thought Content: Thought content normal.        Judgment: Judgment normal.      Assessment & Plan:  1. Pre-diabetes  - POC HgB A1c- 5.9 - has improved.  - Will keep her on Metformin  - Follow up as CPE  - metFORMIN (GLUCOPHAGE) 500 MG tablet; Take 1 tablet (500 mg total) by mouth daily with breakfast.  Dispense: 90 tablet; Refill: 2  2. Attention deficit hyperactivity disorder (ADHD), combined type - Will take over her Adderall prescription. No red flags in state controlled substance system.  - Will start on Adderall 20 mg daily.  - amphetamine-dextroamphetamine (ADDERALL) 20 MG tablet; Take 1 tablet (20 mg total) by mouth daily.  Dispense: 30 tablet; Refill: 0  3. Hypothyroidism, unspecified type  - SYNTHROID 50 MCG tablet; Take 1 tablet (50 mcg total) by mouth daily before breakfast.  Dispense: 90 tablet; Refill: 3  Dorothyann Peng, NP

## 2021-07-16 ENCOUNTER — Telehealth: Payer: Self-pay | Admitting: Adult Health

## 2021-07-16 DIAGNOSIS — F902 Attention-deficit hyperactivity disorder, combined type: Secondary | ICD-10-CM

## 2021-07-16 NOTE — Telephone Encounter (Signed)
Pt called to request a refill of the  amphetamine-dextroamphetamine (ADDERALL) 20 MG tablet  Pt stated she has only one pill left.  [id number]  [phone] Fax:  [phone]

## 2021-07-16 NOTE — Telephone Encounter (Signed)
Okay for refill?    LOV 06/16/2021   Last Refill         amphetamine-dextroamphetamine (ADDERALL) 20 MG tablet 30 tablet 0 06/16/2021

## 2021-07-17 MED ORDER — AMPHETAMINE-DEXTROAMPHETAMINE 20 MG PO TABS: 20.0000 mg | ORAL_TABLET | Freq: Every day | ORAL | 0 refills | Status: DC

## 2021-08-13 ENCOUNTER — Telehealth: Payer: Self-pay | Admitting: Adult Health

## 2021-08-13 DIAGNOSIS — F902 Attention-deficit hyperactivity disorder, combined type: Secondary | ICD-10-CM

## 2021-08-13 MED ORDER — AMPHETAMINE-DEXTROAMPHETAMINE 20 MG PO TABS: 20.0000 mg | ORAL_TABLET | Freq: Every day | ORAL | 0 refills | Status: DC

## 2021-08-13 MED ORDER — AMPHETAMINE-DEXTROAMPHETAMINE 20 MG PO TABS
20.0000 mg | ORAL_TABLET | Freq: Every day | ORAL | 0 refills | Status: DC
Start: 2021-08-13 — End: 2021-11-19

## 2021-08-13 NOTE — Telephone Encounter (Signed)
Pt is calling and would like a refill on amphetamine-dextroamphetamine (ADDERALL) 20 MG tablet  [id number] [place], [place] - [address] Phone:  [phone]  Fax:  [phone]     

## 2021-08-13 NOTE — Telephone Encounter (Signed)
Okay for refill?    LOV 06/16/2021    Last Refill      amphetamine-dextroamphetamine amphetamine-dextroamphetamine (ADDERALL) 20 MG tablet Take 1 tablet (20 mg total) by mouth daily., Starting Fri 07/17/2021, Normal  Dispense: 30 tablet

## 2021-08-28 DIAGNOSIS — D1801 Hemangioma of skin and subcutaneous tissue: Secondary | ICD-10-CM | POA: Diagnosis not present

## 2021-08-28 DIAGNOSIS — D225 Melanocytic nevi of trunk: Secondary | ICD-10-CM | POA: Diagnosis not present

## 2021-08-28 DIAGNOSIS — L821 Other seborrheic keratosis: Secondary | ICD-10-CM | POA: Diagnosis not present

## 2021-08-28 DIAGNOSIS — L814 Other melanin hyperpigmentation: Secondary | ICD-10-CM | POA: Diagnosis not present

## 2021-11-13 ENCOUNTER — Telehealth: Payer: Self-pay | Admitting: Adult Health

## 2021-11-13 ENCOUNTER — Telehealth: Payer: PPO

## 2021-11-13 DIAGNOSIS — F902 Attention-deficit hyperactivity disorder, combined type: Secondary | ICD-10-CM

## 2021-11-13 NOTE — Telephone Encounter (Signed)
Pt is calling and would like a refill on amphetamine-dextroamphetamine (ADDERALL) 20 MG tablet  [id number] [address]:  [phone] Fax:  [phone]

## 2021-11-19 MED ORDER — AMPHETAMINE-DEXTROAMPHETAMINE 20 MG PO TABS: 20.0000 mg | ORAL_TABLET | Freq: Every day | ORAL | 0 refills | Status: DC

## 2021-11-19 NOTE — Telephone Encounter (Signed)
Patient informed of the message below.

## 2021-12-02 ENCOUNTER — Encounter: Payer: Self-pay | Admitting: Adult Health

## 2021-12-02 ENCOUNTER — Ambulatory Visit (INDEPENDENT_AMBULATORY_CARE_PROVIDER_SITE_OTHER): Payer: PPO | Admitting: Adult Health

## 2021-12-02 VITALS — BP 160/100 | HR 70 | Temp 98.7°F | Ht 59.0 in

## 2021-12-02 DIAGNOSIS — R7303 Prediabetes: Secondary | ICD-10-CM

## 2021-12-02 DIAGNOSIS — F902 Attention-deficit hyperactivity disorder, combined type: Secondary | ICD-10-CM

## 2021-12-02 LAB — POCT GLYCOSYLATED HEMOGLOBIN (HGB A1C): Hemoglobin A1C: 5.7 % — AB (ref 4.0–5.6)

## 2021-12-02 MED ORDER — AMPHETAMINE-DEXTROAMPHETAMINE 30 MG PO TABS: 30.0000 mg | ORAL_TABLET | Freq: Every day | ORAL | 0 refills | Status: DC

## 2021-12-02 NOTE — Progress Notes (Signed)
Subjective:    Patient ID: Diana [name], female    DOB: 08/05/1950, 71 y.o.   MRN: [id number]  HPI 71 year old female who  has a past medical history of ADD (attention deficit disorder), Cancer ([place]), Depression, Diverticulosis, DJD (degenerative joint disease), GERD (gastroesophageal reflux disease), History of hiatal hernia, Mood disorder ([place]), SCC (squamous cell carcinoma) in situ (04/22/2010), and STD (sexually transmitted disease).  She presents to the office today for follow-up regarding prediabetes and ADD.  Prediabetes -is currently managed with metformin 500 mg daily.  She has no side effects of this medication has been tolerating it well.  She has been eating healthy and exercising more frequently. She denies hypoglycemic episodes  Lab Results  Component Value Date   HGBA1C 5.9 06/16/2021   ADD -newly managed on Adderall 20 mg daily. In the past she was on 30 mg XR but this was out of stock and was more expensive. During the national shortage we decreased her dose to 20 mg daily. She does not feel well controlled ( trouble concentrating)  on this dose and is wondering if we can try increasing the dose to 30 mg.   Review of Systems See HPI   Past Medical History:  Diagnosis Date   ADD (attention deficit disorder)    Cancer (HCC)    skin   Depression    Diverticulosis    DJD (degenerative joint disease)    GERD (gastroesophageal reflux disease)    History of hiatal hernia    Mood disorder (HCC)    SCC (squamous cell carcinoma) in situ 04/22/2010   right forearm   STD (sexually transmitted disease)    HSV II    Social History   Socioeconomic History   Marital status: Married    Spouse name: Not on file   Number of children: 1   Years of education: Not on file   Highest education level: Some college, no degree  Occupational History   Not on file  Tobacco Use   Smoking status: Former    Packs/day: 1.00    Years: 20.00    Total pack years: 20.00    Types:  Cigarettes    Quit date: 06/29/2006    Years since quitting: 15.4   Smokeless tobacco: Never  Vaping Use   Vaping Use: Never used  Substance and Sexual Activity   Alcohol use: No    Alcohol/week: 0.0 standard drinks of alcohol    Comment: occ beer   Drug use: No   Sexual activity: Not Currently    Birth control/protection: Post-menopausal  Other Topics Concern   Not on file  Social History Narrative   Not on file   Social Determinants of Health   Financial Resource Strain: Low Risk  (03/16/2021)   Overall Financial Resource Strain (CARDIA)    Difficulty of Paying Living Expenses: Not hard at all  Food Insecurity: No Food Insecurity (03/16/2021)   Hunger Vital Sign    Worried About Running Out of Food in the Last Year: Never true    Ran Out of Food in the Last Year: Never true  Transportation Needs: No Transportation Needs (03/16/2021)   PRAPARE - Hydrologist (Medical): No    Lack of Transportation (Non-Medical): No  Physical Activity: Sufficiently Active (03/16/2021)   Exercise Vital Sign    Days of Exercise per Week: 5 days    Minutes of Exercise per Session: 50 min  Stress: No Stress Concern Present (  03/16/2021)   [place]    Feeling of Stress : Only a little  Social Connections: Socially Integrated (03/16/2021)   Social Connection and Isolation Panel [NHANES]    Frequency of Communication with Friends and Family: More than three times a week    Frequency of Social Gatherings with Friends and Family: Three times a week    Attends Religious Services: More than 4 times per year    Active Member of Clubs or Organizations: Yes    Attends Archivist Meetings: More than 4 times per year    Marital Status: Married  Human resources officer Violence: Not At Risk (01/20/2021)   Humiliation, Afraid, Rape, and Kick questionnaire    Fear of Current or Ex-Partner: No    Emotionally Abused:  No    Physically Abused: No    Sexually Abused: No    Past Surgical History:  Procedure Laterality Date   ABDOMINAL HYSTERECTOMY     partial   BREAST IMPLANT REMOVAL     CESAREAN SECTION     COLON SURGERY     had about 1 foot of bowel removed    [place] N/A 10/23/2014   Procedure: EXCISION OF BACK LIPOMA;  Surgeon: Donnie Mesa, MD;  Location: [place];  Service: General;  Laterality: N/A;   PARTIAL COLECTOMY     PARTIAL HYSTERECTOMY     still has ovaries    PILONIDAL CYST EXCISION     PLACEMENT OF BREAST IMPLANTS     RHINOPLASTY     x 2   SHOULDER SURGERY Right    06    Family History  Problem Relation Age of Onset   Melanoma Mother    Kidney cancer Father    Diabetes Father    Breast cancer Paternal Grandmother    Cancer Maternal Grandmother        possible uterine?     Allergies  Allergen Reactions   Ezetimibe     REACTION: statins make joints ache   Latex Hives   Morphine Sulfate     REACTION: n' \\T'$ \ v    Current Outpatient Medications on File Prior to Visit  Medication Sig Dispense Refill   Alum Hydroxide-Mag Carbonate (GAVISCON PO) Take by mouth as needed.      amphetamine-dextroamphetamine (ADDERALL) 20 MG tablet Take 1 tablet (20 mg total) by mouth daily. 30 tablet 0   amphetamine-dextroamphetamine (ADDERALL) 20 MG tablet Take 1 tablet (20 mg total) by mouth daily. 30 tablet 0   amphetamine-dextroamphetamine (ADDERALL) 20 MG tablet Take 1 tablet (20 mg total) by mouth daily. 30 tablet 0   Ascorbic Acid (VITAMIN C WITH ROSE HIPS) 500 MG tablet Take 500 mg by mouth daily.     Cholecalciferol (VITAMIN D3) 50 MCG (2000 UT) TABS Take 1 capsule by mouth daily.      esomeprazole (NEXIUM) 10 MG packet Take 10 mg by mouth daily before breakfast.     fenofibrate (TRICOR) 145 MG tablet Take 1 tablet (145 mg total) by mouth daily. 90 tablet 3   ibuprofen (ADVIL,MOTRIN) 200 MG tablet Take 400 mg by mouth every 6 (six) hours as needed.      Multiple Vitamins-Minerals (ZINC PO) Take 20 mg by mouth daily.     Omega-3 Fatty Acids (OMEGA 3 PO) Take 1 capsule by mouth daily.     Probiotic Product (PROBIOTIC ADVANCED PO) Take 1 capsule by mouth daily.  SYNTHROID 50 MCG tablet Take 1 tablet (50 mcg total) by mouth daily before breakfast. 90 tablet 3   vitamin B-12 (CYANOCOBALAMIN) 1000 MCG tablet Take 1,000 mcg by mouth daily.     Zinc 30 MG CAPS Take by mouth.     metFORMIN (GLUCOPHAGE) 500 MG tablet Take 1 tablet (500 mg total) by mouth daily with breakfast. 90 tablet 2   No current facility-administered medications on file prior to visit.    BP (!) 160/100   Pulse 70   Temp 98.7 F (37.1 C) (Oral)   Ht '4\' 11"'$  (1.499 m)   SpO2 96%   BMI 28.68 kg/m       Objective:   Physical Exam Vitals and nursing note reviewed.  Constitutional:      General: She is not in acute distress.    Appearance: Normal appearance. She is well-developed. She is not ill-appearing.  HENT:     Head: Normocephalic and atraumatic.     Right Ear: Tympanic membrane, ear canal and external ear normal. There is no impacted cerumen.     Left Ear: Tympanic membrane, ear canal and external ear normal. There is no impacted cerumen.     Nose: Nose normal. No congestion or rhinorrhea.     Mouth/Throat:     Mouth: Mucous membranes are moist.     Pharynx: Oropharynx is clear. No oropharyngeal exudate or posterior oropharyngeal erythema.  Eyes:     General:        Right eye: No discharge.        Left eye: No discharge.     Extraocular Movements: Extraocular movements intact.     Conjunctiva/sclera: Conjunctivae normal.     Pupils: Pupils are equal, round, and reactive to light.  Neck:     Thyroid: No thyromegaly.     Vascular: No carotid bruit.     Trachea: No tracheal deviation.  Cardiovascular:     Rate and Rhythm: Normal rate and regular rhythm.     Pulses: Normal pulses.     Heart sounds: Normal heart sounds. No murmur heard.    No friction  rub. No gallop.  Pulmonary:     Effort: Pulmonary effort is normal. No respiratory distress.     Breath sounds: Normal breath sounds. No stridor. No wheezing, rhonchi or rales.  Chest:     Chest wall: No tenderness.  Abdominal:     General: Abdomen is flat. Bowel sounds are normal. There is no distension.     Palpations: Abdomen is soft. There is no mass.     Tenderness: There is no abdominal tenderness. There is no right CVA tenderness, left CVA tenderness, guarding or rebound.     Hernia: No hernia is present.  Musculoskeletal:        General: No swelling, tenderness, deformity or signs of injury. Normal range of motion.     Cervical back: Normal range of motion and neck supple.     Right lower leg: No edema.     Left lower leg: No edema.  Lymphadenopathy:     Cervical: No cervical adenopathy.  Skin:    General: Skin is warm and dry.     Coloration: Skin is not jaundiced or pale.     Findings: No bruising, erythema, lesion or rash.  Neurological:     General: No focal deficit present.     Mental Status: She is alert and oriented to person, place, and time.     Cranial Nerves: No cranial nerve  deficit.     Sensory: No sensory deficit.     Motor: No weakness.     Coordination: Coordination normal.     Gait: Gait normal.     Deep Tendon Reflexes: Reflexes normal.  Psychiatric:        Mood and Affect: Mood normal.        Behavior: Behavior normal.        Thought Content: Thought content normal.        Judgment: Judgment normal.       Assessment & Plan:  1. Attention deficit hyperactivity disorder (ADHD), combined type - Will send in 30 mg dosing. She will let me know how she feels with this dose - amphetamine-dextroamphetamine (ADDERALL) 30 MG tablet; Take 1 tablet by mouth daily.  Dispense: 30 tablet; Refill: 0  2. Pre-diabetes  - POC HgB A1c- 5.7  - No change in medication    Dorothyann Peng, NP  Time spent with patient today was 31 minutes which consisted of chart  review, discussing diagnosis, work up, treatment answering questions, listening,  and documentation.

## 2021-12-31 ENCOUNTER — Other Ambulatory Visit: Payer: Self-pay | Admitting: Adult Health

## 2021-12-31 ENCOUNTER — Telehealth: Payer: Self-pay | Admitting: Adult Health

## 2021-12-31 DIAGNOSIS — F902 Attention-deficit hyperactivity disorder, combined type: Secondary | ICD-10-CM

## 2021-12-31 MED ORDER — AMPHETAMINE-DEXTROAMPHETAMINE 30 MG PO TABS: 30.0000 mg | ORAL_TABLET | Freq: Every day | ORAL | 0 refills | Status: DC

## 2021-12-31 NOTE — Telephone Encounter (Signed)
Okay for refill?  

## 2021-12-31 NOTE — Telephone Encounter (Signed)
Patient requests refill amphetamine-dextroamphetamine (ADDERALL) 30 MG tablet

## 2021-12-31 NOTE — Telephone Encounter (Signed)
Pt was able to pick this up

## 2022-01-21 ENCOUNTER — Ambulatory Visit (INDEPENDENT_AMBULATORY_CARE_PROVIDER_SITE_OTHER): Payer: PPO

## 2022-01-21 VITALS — Ht 59.0 in | Wt 142.0 lb

## 2022-01-21 DIAGNOSIS — Z0000 Encounter for general adult medical examination without abnormal findings: Secondary | ICD-10-CM | POA: Diagnosis not present

## 2022-01-21 NOTE — Progress Notes (Signed)
Subjective:   Diana [name] is a 72 y.o. female who presents for Medicare Annual (Subsequent) preventive examination.  Review of Systems   Virtual Visit via Telephone Note  I connected with  Diana [name] on 01/21/22 at 11:15 AM EST by telephone and verified that I am speaking with the correct person using two identifiers.  Location: Patient: Home Provider: Office Persons participating in the virtual visit: patient/Nurse Health Advisor   I discussed the limitations, risks, security and privacy concerns of performing an evaluation and management service by telephone and the availability of in person appointments. The patient expressed understanding and agreed to proceed.  Interactive audio and video telecommunications were attempted between this nurse and patient, however failed, due to patient having technical difficulties OR patient did not have access to video capability.  We continued and completed visit with audio only.  Some vital signs may be absent or patient reported.   Criselda Peaches, LPN  Cardiac Risk Factors include: advanced age (>26mn, >>55women);hypertension     Objective:    Today's Vitals   01/21/22 1118  Weight: 142 lb (64.4 kg)  Height: '4\' 11"'$  (1.499 m)   Body mass index is 28.68 kg/m.     01/21/2022   11:31 AM 01/20/2021   11:33 AM 04/19/2020    9:38 AM 01/01/2020    2:54 PM 10/16/2015    4:06 PM 04/01/2015    3:03 PM 10/23/2014    1:27 PM  Advanced Directives  Does Patient Have a Medical Advance Directive? Yes Yes Yes Yes Yes Yes   Type of AParamedicof ALindseyLiving will HBalfourLiving will Living will;Healthcare Power of ADos PalosLiving will HAlturaLiving will HFarragutLiving will   Does patient want to make changes to medical advance directive?  No - Patient declined  No - Patient declined No - Patient declined    Copy of  HRed Lakein Chart? No - copy requested No - copy requested  No - copy requested Yes  No - copy requested    Current Medications (verified) Outpatient Encounter Medications as of 01/21/2022  Medication Sig   Alum Hydroxide-Mag Carbonate (GAVISCON PO) Take by mouth as needed.    amphetamine-dextroamphetamine (ADDERALL) 30 MG tablet Take 1 tablet by mouth daily.   amphetamine-dextroamphetamine (ADDERALL) 30 MG tablet Take 1 tablet by mouth daily.   amphetamine-dextroamphetamine (ADDERALL) 30 MG tablet Take 1 tablet by mouth daily.   Ascorbic Acid (VITAMIN C WITH ROSE HIPS) 500 MG tablet Take 500 mg by mouth daily.   Cholecalciferol (VITAMIN D3) 50 MCG (2000 UT) TABS Take 1 capsule by mouth daily.    esomeprazole (NEXIUM) 10 MG packet Take 10 mg by mouth daily before breakfast.   fenofibrate (TRICOR) 145 MG tablet Take 1 tablet (145 mg total) by mouth daily.   ibuprofen (ADVIL,MOTRIN) 200 MG tablet Take 400 mg by mouth every 6 (six) hours as needed.   metFORMIN (GLUCOPHAGE) 500 MG tablet Take 1 tablet (500 mg total) by mouth daily with breakfast.   Multiple Vitamins-Minerals (ZINC PO) Take 20 mg by mouth daily.   Omega-3 Fatty Acids (OMEGA 3 PO) Take 1 capsule by mouth daily.   Probiotic Product (PROBIOTIC ADVANCED PO) Take 1 capsule by mouth daily.   SYNTHROID 50 MCG tablet Take 1 tablet (50 mcg total) by mouth daily before breakfast.   vitamin B-12 (CYANOCOBALAMIN) 1000 MCG tablet Take 1,000 mcg by  mouth daily.   Zinc 30 MG CAPS Take by mouth.   No facility-administered encounter medications on file as of 01/21/2022.    Allergies (verified) Ezetimibe, Latex, and Morphine sulfate   History: Past Medical History:  Diagnosis Date   ADD (attention deficit disorder)    Cancer (HCC)    skin   Depression    Diverticulosis    DJD (degenerative joint disease)    GERD (gastroesophageal reflux disease)    History of hiatal hernia    Mood disorder (HCC)    SCC (squamous  cell carcinoma) in situ 04/22/2010   right forearm   STD (sexually transmitted disease)    HSV II   Past Surgical History:  Procedure Laterality Date   ABDOMINAL HYSTERECTOMY     partial   BREAST IMPLANT REMOVAL     CESAREAN SECTION     COLON SURGERY     had about 1 foot of bowel removed    [place] N/A 10/23/2014   Procedure: EXCISION OF BACK LIPOMA;  Surgeon: Donnie Mesa, MD;  Location: [place] [facility] [place];  Service: General;  Laterality: N/A;   PARTIAL COLECTOMY     PARTIAL HYSTERECTOMY     still has ovaries    PILONIDAL CYST EXCISION     PLACEMENT OF BREAST IMPLANTS     RHINOPLASTY     x 2   SHOULDER SURGERY Right    06   Family History  Problem Relation Age of Onset   Melanoma Mother    Kidney cancer Father    Diabetes Father    Breast cancer Paternal Grandmother    Cancer Maternal Grandmother        possible uterine?    Social History   Socioeconomic History   Marital status: Married    Spouse name: Not on file   Number of children: 1   Years of education: Not on file   Highest education level: Some college, no degree  Occupational History   Not on file  Tobacco Use   Smoking status: Former    Packs/day: 1.00    Years: 20.00    Total pack years: 20.00    Types: Cigarettes    Quit date: 06/29/2006    Years since quitting: 15.5   Smokeless tobacco: Never  Vaping Use   Vaping Use: Never used  Substance and Sexual Activity   Alcohol use: No    Alcohol/week: 0.0 standard drinks of alcohol    Comment: occ beer   Drug use: No   Sexual activity: Not Currently    Birth control/protection: Post-menopausal  Other Topics Concern   Not on file  Social History Narrative   Not on file   Social Determinants of Health   Financial Resource Strain: Low Risk  (01/21/2022)   Overall Financial Resource Strain (CARDIA)    Difficulty of Paying Living Expenses: Not hard at all  Food Insecurity: No Food Insecurity (01/21/2022)   Hunger Vital Sign     Worried About Running Out of Food in the Last Year: Never true    Ran Out of Food in the Last Year: Never true  Transportation Needs: No Transportation Needs (01/21/2022)   PRAPARE - Hydrologist (Medical): No    Lack of Transportation (Non-Medical): No  Physical Activity: Sufficiently Active (01/21/2022)   Exercise Vital Sign    Days of Exercise per Week: 4 days    Minutes of Exercise per Session: 60 min  Stress: No Stress Concern Present (01/21/2022)   [place]    Feeling of Stress : Not at all  Social Connections: [place] (01/21/2022)   Social Connection and Isolation Panel [NHANES]    Frequency of Communication with Friends and Family: More than three times a week    Frequency of Social Gatherings with Friends and Family: More than three times a week    Attends Religious Services: More than 4 times per year    Active Member of [employer] or Organizations: Yes    Attends Music therapist: More than 4 times per year    Marital Status: Married    Tobacco Counseling Counseling given: Not Answered   Clinical Intake:  Pre-visit preparation completed: No  Pain : No/denies pain     BMI - recorded: 28.68 Nutritional Status: BMI 25 -29 Overweight Nutritional Risks: None Diabetes: Yes CBG done?: No Did pt. bring in CBG monitor from home?: No  How often do you need to have someone help you when you read instructions, pamphlets, or other written materials from your doctor or pharmacy?: 1 - Never  Diabetic?  Yes (Pre)  Interpreter Needed?: No  Information entered by :: Rolene Arbour LPN   Activities of Daily Living    01/21/2022   11:29 AM 01/18/2022    9:32 AM  In your present state of health, do you have any difficulty performing the following activities:  Hearing? 0 0  Vision? 0 0  Difficulty concentrating or making decisions? 0 0  Walking or climbing stairs? 0  0  Dressing or bathing? 0 0  Doing errands, shopping? 0 0  Preparing Food and eating ? N N  Using the Toilet? N N  In the past six months, have you accidently leaked urine? N N  Do you have problems with loss of bowel control? N N  Managing your Medications? N N  Managing your Finances? N N  Housekeeping or managing your Housekeeping? N N    Patient Care Team: Dorothyann Peng, NP as PCP - General (Family Medicine) Roseanne Kaufman, MD as Consulting Physician (Orthopedic Surgery) Viona Gilmore, [facility] as Pharmacist (Pharmacist)  Indicate any recent Medical Services you may have received from other than [facility] providers in the past year (date may be approximate).     Assessment:   This is a routine wellness examination for Diana [name].  Hearing/Vision screen Hearing Screening - Comments:: Denies hearing difficulties   Vision Screening - Comments:: Wears rx glasses - up to date with routine eye exams with  Deferred  Dietary issues and exercise activities discussed: Exercise limited by: None identified   Goals Addressed               This Visit's Progress     Increase physical activity (pt-stated)         Depression Screen    01/21/2022   11:28 AM 03/17/2021   11:01 AM 01/20/2021   11:26 AM 01/07/2021   11:06 AM 01/01/2020    2:59 PM 10/27/2017    8:20 AM 09/02/2014   10:52 AM  PHQ 2/9 Scores  PHQ - 2 Score 0 0 0 0 0 0 0  PHQ- 9 Score  2 0 6 0      Fall Risk    01/21/2022   11:30 AM 01/18/2022    9:32 AM 03/17/2021   11:01 AM 03/16/2021    7:05 PM 01/20/2021    8:35 AM  Fall  Risk   Falls in the past year? 0 0 0 0 0  Number falls in past yr: 0 0 0  0  Injury with Fall? 0  0  0  Risk for fall due to : No Fall Risks      Follow up Falls prevention discussed        FALL RISK PREVENTION PERTAINING TO THE HOME:  Any stairs in or around the home? Yes  If so, are there any without handrails? No  Home free of loose throw rugs in walkways, pet beds, electrical cords, etc? Yes   Adequate lighting in your home to reduce risk of falls? Yes   ASSISTIVE DEVICES UTILIZED TO PREVENT FALLS:  Life alert? No  Use of a cane, walker or w/c? No  Grab bars in the bathroom? Yes  Shower chair or bench in shower? Yes  Elevated toilet seat or a handicapped toilet? No   TIMED UP AND GO:  Was the test performed? No . Audio Visit  Cognitive Function:        01/21/2022   11:31 AM 01/20/2021   11:31 AM  6CIT Screen  What Year? 0 points 0 points  What month? 0 points 0 points  What time? 0 points 0 points  Count back from 20 0 points 0 points  Months in reverse 0 points 0 points  Repeat phrase 0 points 0 points  Total Score 0 points 0 points    Immunizations Immunization History  Administered Date(s) Administered   Fluad Quad(high Dose 65+) 10/26/2018, 09/29/2021   Influenza Split 09/28/2012   Influenza Whole 11/05/2004, 09/18/2008   Influenza, High Dose Seasonal PF 09/02/2014, 10/13/2015, 11/18/2016, 10/20/2017, 10/29/2019, 10/06/2020   Influenza-Unspecified 10/18/2013   PFIZER(Purple Top)SARS-COV-2 Vaccination 08/18/2019, 09/08/2019   Pneumococcal Conjugate-13 05/05/2015   Pneumococcal Polysaccharide-23 03/21/2007, 02/11/2017   Td 01/19/2003   Tdap 05/05/2015   Zoster, Live 03/27/2013    TDAP status: Up to date  Flu Vaccine status: Up to date  Pneumococcal vaccine status: Up to date  Covid-19 vaccine status: Completed vaccines  Qualifies for Shingles Vaccine? Yes   Zostavax completed No   Shingrix Completed?: No.    Education has been provided regarding the importance of this vaccine. Patient has been advised to call insurance company to determine out of pocket expense if they have not yet received this vaccine. Advised may also receive vaccine at local pharmacy or Health Dept. Verbalized acceptance and understanding.  Screening Tests Health Maintenance  Topic Date Due   COVID-19 Vaccine (3 - Pfizer risk series) 02/06/2022 (Originally 10/06/2019)    Zoster Vaccines- Shingrix (1 of 2) 04/22/2022 (Originally 04/07/1969)   MAMMOGRAM  01/22/2023 (Originally 07/10/2021)   Medicare Annual Wellness (AWV)  01/22/2023   COLONOSCOPY (Pts 45-50yr Insurance coverage will need to be confirmed)  03/31/2025   DTaP/Tdap/Td (3 - Td or Tdap) 05/04/2025   Pneumonia Vaccine 72 Years old  Completed   INFLUENZA VACCINE  Completed   DEXA SCAN  Completed   Hepatitis C Screening  Completed   HPV VACCINES  Aged Out    Health Maintenance  There are no preventive care reminders to display for this patient.   Colorectal cancer screening: Type of screening: Colonoscopy. Completed 04/01/15. Repeat every 10 years  Mammogram status: Ordered Patient deferred. Pt provided with contact info and advised to call to schedule appt.   Bone Density status: Completed 04/30/21. Results reflect: Bone density results: OSTEOPOROSIS. Repeat every   years.  Lung Cancer Screening: (Low Dose CT  Chest recommended if Age 49-80 years, 30 pack-year currently smoking OR have quit w/in 15years.) does not qualify.     Additional Screening:  Hepatitis C Screening: does qualify; Completed 10/24/18  Vision Screening: Recommended annual ophthalmology exams for early detection of glaucoma and other disorders of the eye. Is the patient up to date with their annual eye exam?  Yes  Who is the provider or what is the name of the office in which the patient attends annual eye exams? Deferred If pt is not established with a provider, would they like to be referred to a provider to establish care? No .   Dental Screening: Recommended annual dental exams for proper oral hygiene  Community Resource Referral / Chronic Care Management:  CRR required this visit?  No   CCM required this visit?  No      Plan:     I have personally reviewed and noted the following in the patient's chart:   Medical and social history Use of alcohol, tobacco or illicit drugs  Current medications and  supplements including opioid prescriptions. Patient is not currently taking opioid prescriptions. Functional ability and status Nutritional status Physical activity Advanced directives List of other physicians Hospitalizations, surgeries, and ER visits in previous 12 months Vitals Screenings to include cognitive, depression, and falls Referrals and appointments  In addition, I have reviewed and discussed with patient certain preventive protocols, quality metrics, and best practice recommendations. A written personalized care plan for preventive services as well as general preventive health recommendations were provided to patient.     Criselda Peaches, LPN   01/18/5518   Nurse Notes: None

## 2022-01-21 NOTE — Patient Instructions (Addendum)
Diana [name] , Thank you for taking time to come for your Medicare Wellness Visit. I appreciate your ongoing commitment to your health goals. Please review the following plan we discussed and let me know if I can assist you in the future.   These are the goals we discussed:  Goals       Exercise 150 min/wk Moderate Activity      Maintain current lifestyle exercise 150/wk.      Increase physical activity (pt-stated)        This is a list of the screening recommended for you and due dates:  Health Maintenance  Topic Date Due   COVID-19 Vaccine (3 - Pfizer risk series) 02/06/2022*   Zoster (Shingles) Vaccine (1 of 2) 04/22/2022*   Mammogram  01/22/2023*   Medicare Annual Wellness Visit  01/22/2023   Colon Cancer Screening  03/31/2025   DTaP/Tdap/Td vaccine (3 - Td or Tdap) 05/04/2025   Pneumonia Vaccine  Completed   Flu Shot  Completed   DEXA scan (bone density measurement)  Completed   Hepatitis C Screening: USPSTF Recommendation to screen - Ages 47-79 yo.  Completed   HPV Vaccine  Aged Out  *Topic was postponed. The date shown is not the original due date.    Advanced directives: Please bring a copy of your health care power of attorney and living will to the office to be added to your chart at your convenience.   Conditions/risks identified: None  Next appointment: Follow up in one year for your annual wellness visit     Preventive Care 65 Years and Older, Female Preventive care refers to lifestyle choices and visits with your health care provider that can promote health and wellness. What does preventive care include? A yearly physical exam. This is also called an annual well check. Dental exams once or twice a year. Routine eye exams. Ask your health care provider how often you should have your eyes checked. Personal lifestyle choices, including: Daily care of your teeth and gums. Regular physical activity. Eating a healthy diet. Avoiding tobacco and drug  use. Limiting alcohol use. Practicing safe sex. Taking low-dose aspirin every day. Taking vitamin and mineral supplements as recommended by your health care provider. What happens during an annual well check? The services and screenings done by your health care provider during your annual well check will depend on your age, overall health, lifestyle risk factors, and family history of disease. Counseling  Your health care provider may ask you questions about your: Alcohol use. Tobacco use. Drug use. Emotional well-being. Home and relationship well-being. Sexual activity. Eating habits. History of falls. Memory and ability to understand (cognition). Work and work Statistician. Reproductive health. Screening  You may have the following tests or measurements: Height, weight, and BMI. Blood pressure. Lipid and cholesterol levels. These may be checked every 5 years, or more frequently if you are over 12 years old. Skin check. Lung cancer screening. You may have this screening every year starting at age 57 if you have a 30-pack-year history of smoking and currently smoke or have quit within the past 15 years. Fecal occult blood test (FOBT) of the stool. You may have this test every year starting at age 65. Flexible sigmoidoscopy or colonoscopy. You may have a sigmoidoscopy every 5 years or a colonoscopy every 10 years starting at age 57. Hepatitis C blood test. Hepatitis B blood test. Sexually transmitted disease (STD) testing. Diabetes screening. This is done by checking your blood sugar (glucose) after you  have not eaten for a while (fasting). You may have this done every 1-3 years. Bone density scan. This is done to screen for osteoporosis. You may have this done starting at age 75. Mammogram. This may be done every 1-2 years. Talk to your health care provider about how often you should have regular mammograms. Talk with your health care provider about your test results, treatment  options, and if necessary, the need for more tests. Vaccines  Your health care provider may recommend certain vaccines, such as: Influenza vaccine. This is recommended every year. Tetanus, diphtheria, and acellular pertussis (Tdap, Td) vaccine. You may need a Td booster every 10 years. Zoster vaccine. You may need this after age 80. Pneumococcal 13-valent conjugate (PCV13) vaccine. One dose is recommended after age 42. Pneumococcal polysaccharide (PPSV23) vaccine. One dose is recommended after age 61. Talk to your health care provider about which screenings and vaccines you need and how often you need them. This information is not intended to replace advice given to you by your health care provider. Make sure you discuss any questions you have with your health care provider. Document Released: 01/31/2015 Document Revised: 09/24/2015 Document Reviewed: 11/05/2014 Elsevier Interactive Patient Education  2017 [employer] Prevention in the Home Falls can cause injuries. They can happen to people of all ages. There are many things you can do to make your home safe and to help prevent falls. What can I do on the outside of my home? Regularly fix the edges of walkways and driveways and fix any cracks. Remove anything that might make you trip as you walk through a door, such as a raised step or threshold. Trim any bushes or trees on the path to your home. Use bright outdoor lighting. Clear any walking paths of anything that might make someone trip, such as rocks or tools. Regularly check to see if handrails are loose or broken. Make sure that both sides of any steps have handrails. Any raised decks and porches should have guardrails on the edges. Have any leaves, snow, or ice cleared regularly. Use sand or salt on walking paths during winter. Clean up any spills in your garage right away. This includes oil or grease spills. What can I do in the bathroom? Use night lights. Install grab  bars by the toilet and in the tub and shower. Do not use towel bars as grab bars. Use non-skid mats or decals in the tub or shower. If you need to sit down in the shower, use a plastic, non-slip stool. Keep the floor dry. Clean up any water that spills on the floor as soon as it happens. Remove soap buildup in the tub or shower regularly. Attach bath mats securely with double-sided non-slip rug tape. Do not have throw rugs and other things on the floor that can make you trip. What can I do in the bedroom? Use night lights. Make sure that you have a light by your bed that is easy to reach. Do not use any sheets or blankets that are too big for your bed. They should not hang down onto the floor. Have a firm chair that has side arms. You can use this for support while you get dressed. Do not have throw rugs and other things on the floor that can make you trip. What can I do in the kitchen? Clean up any spills right away. Avoid walking on wet floors. Keep items that you use a lot in easy-to-reach places. If you need  to reach something above you, use a strong step stool that has a grab bar. Keep electrical cords out of the way. Do not use floor polish or wax that makes floors slippery. If you must use wax, use non-skid floor wax. Do not have throw rugs and other things on the floor that can make you trip. What can I do with my stairs? Do not leave any items on the stairs. Make sure that there are handrails on both sides of the stairs and use them. Fix handrails that are broken or loose. Make sure that handrails are as long as the stairways. Check any carpeting to make sure that it is firmly attached to the stairs. Fix any carpet that is loose or worn. Avoid having throw rugs at the top or bottom of the stairs. If you do have throw rugs, attach them to the floor with carpet tape. Make sure that you have a light switch at the top of the stairs and the bottom of the stairs. If you do not have them,  ask someone to add them for you. What else can I do to help prevent falls? Wear shoes that: Do not have high heels. Have rubber bottoms. Are comfortable and fit you well. Are closed at the toe. Do not wear sandals. If you use a stepladder: Make sure that it is fully opened. Do not climb a closed stepladder. Make sure that both sides of the stepladder are locked into place. Ask someone to hold it for you, if possible. Clearly mark and make sure that you can see: Any grab bars or handrails. First and last steps. Where the edge of each step is. Use tools that help you move around (mobility aids) if they are needed. These include: Canes. Walkers. Scooters. Crutches. Turn on the lights when you go into a dark area. Replace any light bulbs as soon as they burn out. Set up your furniture so you have a clear path. Avoid moving your furniture around. If any of your floors are uneven, fix them. If there are any pets around you, be aware of where they are. Review your medicines with your doctor. Some medicines can make you feel dizzy. This can increase your chance of falling. Ask your doctor what other things that you can do to help prevent falls. This information is not intended to replace advice given to you by your health care provider. Make sure you discuss any questions you have with your health care provider. Document Released: 10/31/2008 Document Revised: 06/12/2015 Document Reviewed: 02/08/2014 Elsevier Interactive Patient Education  2017 [employer].

## 2022-03-05 ENCOUNTER — Other Ambulatory Visit: Payer: Self-pay | Admitting: Adult Health

## 2022-03-05 DIAGNOSIS — R7303 Prediabetes: Secondary | ICD-10-CM

## 2022-03-05 DIAGNOSIS — E7800 Pure hypercholesterolemia, unspecified: Secondary | ICD-10-CM

## 2022-04-14 ENCOUNTER — Other Ambulatory Visit: Payer: Self-pay | Admitting: Adult Health

## 2022-04-14 ENCOUNTER — Telehealth: Payer: Self-pay | Admitting: Adult Health

## 2022-04-14 DIAGNOSIS — F902 Attention-deficit hyperactivity disorder, combined type: Secondary | ICD-10-CM

## 2022-04-14 MED ORDER — AMPHETAMINE-DEXTROAMPHETAMINE 30 MG PO TABS: 30.0000 mg | ORAL_TABLET | Freq: Every day | ORAL | 0 refills | Status: DC

## 2022-04-14 NOTE — Telephone Encounter (Signed)
Diana [name] will fill for 30 days pt scheduled for CPE

## 2022-04-14 NOTE — Telephone Encounter (Signed)
Prescription Request  04/14/2022  LOV: 12/02/2021  What is the name of the medication or equipment?  amphetamine-dextroamphetamine (ADDERALL) 30 MG tablet  amphetamine-dextroamphetamine (ADDERALL) 30 MG tablet  amphetamine-dextroamphetamine (ADDERALL) 30 MG tablet (  Have you contacted your pharmacy to request a refill? No   Which pharmacy would you like this sent to?  [id number] [address], [address] [place] [address] Phone: [phone] Fax: [phone]   Patient notified that their request is being sent to the clinical staff for review and that they should receive a response within 2 business days.   Please advise at Mobile [phone] (mobile)

## 2022-05-05 ENCOUNTER — Ambulatory Visit (INDEPENDENT_AMBULATORY_CARE_PROVIDER_SITE_OTHER): Payer: PPO | Admitting: Adult Health

## 2022-05-05 ENCOUNTER — Encounter: Payer: Self-pay | Admitting: Adult Health

## 2022-05-05 VITALS — BP 132/80 | HR 79 | Temp 98.7°F | Ht 59.0 in | Wt 142.0 lb

## 2022-05-05 DIAGNOSIS — Z0001 Encounter for general adult medical examination with abnormal findings: Secondary | ICD-10-CM | POA: Diagnosis not present

## 2022-05-05 DIAGNOSIS — E039 Hypothyroidism, unspecified: Secondary | ICD-10-CM | POA: Diagnosis not present

## 2022-05-05 DIAGNOSIS — R7303 Prediabetes: Secondary | ICD-10-CM | POA: Diagnosis not present

## 2022-05-05 DIAGNOSIS — E7800 Pure hypercholesterolemia, unspecified: Secondary | ICD-10-CM

## 2022-05-05 DIAGNOSIS — E559 Vitamin D deficiency, unspecified: Secondary | ICD-10-CM

## 2022-05-05 DIAGNOSIS — K2100 Gastro-esophageal reflux disease with esophagitis, without bleeding: Secondary | ICD-10-CM

## 2022-05-05 DIAGNOSIS — R1011 Right upper quadrant pain: Secondary | ICD-10-CM

## 2022-05-05 DIAGNOSIS — F902 Attention-deficit hyperactivity disorder, combined type: Secondary | ICD-10-CM

## 2022-05-05 DIAGNOSIS — Z0000 Encounter for general adult medical examination without abnormal findings: Secondary | ICD-10-CM

## 2022-05-05 LAB — COMPREHENSIVE METABOLIC PANEL
ALT: 18 U/L (ref 0–35)
AST: 24 U/L (ref 0–37)
Albumin: 4.6 g/dL (ref 3.5–5.2)
Alkaline Phosphatase: 66 U/L (ref 39–117)
BUN: 17 mg/dL (ref 6–23)
CO2: 26 mEq/L (ref 19–32)
Calcium: 9.7 mg/dL (ref 8.4–10.5)
Chloride: 104 mEq/L (ref 96–112)
Creatinine, Ser: 0.92 mg/dL (ref 0.40–1.20)
GFR: 61.52 mL/min (ref >60.00)
Glucose, Bld: 103 mg/dL — ABNORMAL HIGH (ref 70–99)
Potassium: 4.3 mEq/L (ref 3.5–5.1)
Sodium: 140 mEq/L (ref 135–145)
Total Bilirubin: 0.4 mg/dL (ref 0.2–1.2)
Total Protein: 7.5 g/dL (ref 6.0–8.3)

## 2022-05-05 LAB — CBC WITH DIFFERENTIAL/PLATELET
Basophils Absolute: 0 10*3/uL (ref 0.0–0.1)
Basophils Relative: 0.8 % (ref 0.0–3.0)
Eosinophils Absolute: 0.1 10*3/uL (ref 0.0–0.7)
Eosinophils Relative: 2.2 % (ref 0.0–5.0)
HCT: 40.7 % (ref 36.0–46.0)
Hemoglobin: 13.6 g/dL (ref 12.0–15.0)
Lymphocytes Relative: 40.4 % (ref 12.0–46.0)
Lymphs Abs: 2.1 10*3/uL (ref 0.7–4.0)
MCHC: 33.3 g/dL (ref 30.0–36.0)
MCV: 86.4 fl (ref 78.0–100.0)
Monocytes Absolute: 0.3 10*3/uL (ref 0.1–1.0)
Monocytes Relative: 6.5 % (ref 3.0–12.0)
Neutro Abs: 2.6 10*3/uL (ref 1.4–7.7)
Neutrophils Relative %: 50.1 % (ref 43.0–77.0)
Platelets: 259 10*3/uL (ref 150.0–400.0)
RBC: 4.72 Mil/uL (ref 3.87–5.11)
RDW: 14 % (ref 11.5–15.5)
WBC: 5.2 10*3/uL (ref 4.0–10.5)

## 2022-05-05 LAB — LIPID PANEL
Cholesterol: 178 mg/dL (ref 0–200)
HDL: 48.9 mg/dL (ref >39.00)
LDL Cholesterol: 97 mg/dL (ref 0–99)
NonHDL: 128.93
Total CHOL/HDL Ratio: 4
Triglycerides: 161 mg/dL — ABNORMAL HIGH (ref 0.0–149.0)
VLDL: 32.2 mg/dL (ref 0.0–40.0)

## 2022-05-05 LAB — LIPASE: Lipase: 34 U/L (ref 11.0–59.0)

## 2022-05-05 LAB — VITAMIN D 25 HYDROXY (VIT D DEFICIENCY, FRACTURES): VITD: 33.95 ng/mL (ref 30.00–100.00)

## 2022-05-05 LAB — HEMOGLOBIN A1C: Hgb A1c MFr Bld: 6.2 % (ref 4.6–6.5)

## 2022-05-05 LAB — TSH: TSH: 1.78 u[IU]/mL (ref 0.35–5.50)

## 2022-05-05 LAB — AMYLASE: Amylase: 55 U/L (ref 27–131)

## 2022-05-05 MED ORDER — AMPHETAMINE-DEXTROAMPHETAMINE 30 MG PO TABS: 30.0000 mg | ORAL_TABLET | Freq: Every day | ORAL | 0 refills | Status: DC

## 2022-05-05 MED ORDER — AMPHETAMINE-DEXTROAMPHETAMINE 30 MG PO TABS
30.0000 mg | ORAL_TABLET | Freq: Every day | ORAL | 0 refills | Status: DC
Start: 2022-05-05 — End: 2022-08-11

## 2022-05-05 NOTE — Progress Notes (Signed)
Subjective:    Patient ID: Diana [name], female    DOB: 1950-11-14, 72 y.o.   MRN: [id number]  HPI Patient presents for yearly preventative medicine examination. She is a pleasant 72 year old female who  has a past medical history of ADD (attention deficit disorder), Cancer, Depression, Diverticulosis, DJD (degenerative joint disease), GERD (gastroesophageal reflux disease), History of hiatal hernia, Mood disorder, SCC (squamous cell carcinoma) in situ (04/22/2010), and STD (sexually transmitted disease).  Hyperlipidemia-currently prescribed Tricor 145 mg daily.  She has been intolerant to many statins in the past.  She denies myalgia or fatigue Lab Results  Component Value Date   CHOL 184 03/17/2021   HDL 47.20 03/17/2021   LDLCALC 109 (H) 03/17/2021   LDLDIRECT 137.0 01/02/2019   TRIG 142.0 03/17/2021   CHOLHDL 4 03/17/2021   Pre Diabetes -last A1c was 6.2.  Currently managed with Metformin 500 mg daily.  Lab Results  Component Value Date   HGBA1C 5.7 (A) 12/02/2021   Hypothyroidism-currently controlled with Synthroid 50 mcg daily.  She does feel well controlled Lab Results  Component Value Date   TSH 1.97 03/17/2021   Vitamin D Deficiency - takes Vitamin D 2000 units daily  Last vitamin D Lab Results  Component Value Date   VD25OH 44.40 03/17/2021   GERD- takes Nexium 10 mg PRN   ADD- managed with Adderall 30 mg daily. She feels well controlled. Denies anorexia, insomnia, or palpitations.    RUQ Pain - this is a new issue over the last 4 months. She has been experiencing intermittent right upper quadrant pain and increased belching. She has occasional nausea associated with this RUQ pain but no vomiting. Not worse after eating. No changes in bowel movement.   All immunizations and health maintenance protocols were reviewed with the patient and needed orders were placed.  Appropriate screening laboratory values were ordered for the patient including screening of  hyperlipidemia, renal function and hepatic function.  Medication reconciliation,  past medical history, social history, problem list and allergies were reviewed in detail with the patient  Goals were established with regard to weight loss, exercise, and  diet in compliance with medications Wt Readings from Last 3 Encounters:  05/05/22 142 lb (64.4 kg)  01/21/22 142 lb (64.4 kg)  06/16/21 142 lb (64.4 kg)   She is up to date on colon cancer screening, bone density screening, and mammograms  Review of Systems  Constitutional: Negative.   HENT: Negative.    Eyes: Negative.   Respiratory: Negative.    Cardiovascular: Negative.   Gastrointestinal:  Positive for abdominal pain and nausea. Negative for constipation, diarrhea and vomiting.  Endocrine: Negative.   Genitourinary: Negative.   Musculoskeletal: Negative.   Skin: Negative.   Allergic/Immunologic: Negative.   Neurological: Negative.   Hematological: Negative.   Psychiatric/Behavioral: Negative.     Past Medical History:  Diagnosis Date   ADD (attention deficit disorder)    Cancer    skin   Depression    Diverticulosis    DJD (degenerative joint disease)    GERD (gastroesophageal reflux disease)    History of hiatal hernia    Mood disorder    SCC (squamous cell carcinoma) in situ 04/22/2010   right forearm   STD (sexually transmitted disease)    HSV II    Social History   Socioeconomic History   Marital status: Married    Spouse name: Not on file   Number of children: 1   Years of education:  Not on file   Highest education level: Some college, no degree  Occupational History   Not on file  Tobacco Use   Smoking status: Former    Packs/day: 1.00    Years: 20.00    Additional pack years: 0.00    Total pack years: 20.00    Types: Cigarettes    Quit date: 06/29/2006    Years since quitting: 15.8   Smokeless tobacco: Never  Vaping Use   Vaping Use: Never used  Substance and Sexual Activity   Alcohol use:  No    Alcohol/week: 0.0 standard drinks of alcohol    Comment: occ beer   Drug use: No   Sexual activity: Not Currently    Birth control/protection: Post-menopausal  Other Topics Concern   Not on file  Social History Narrative   Not on file   Social Determinants of Health   Financial Resource Strain: Low Risk  (01/21/2022)   Overall Financial Resource Strain (CARDIA)    Difficulty of Paying Living Expenses: Not hard at all  Food Insecurity: No Food Insecurity (01/21/2022)   Hunger Vital Sign    Worried About Running Out of Food in the Last Year: Never true    Ran Out of Food in the Last Year: Never true  Transportation Needs: No Transportation Needs (01/21/2022)   PRAPARE - Administrator, Civil Service (Medical): No    Lack of Transportation (Non-Medical): No  Physical Activity: Sufficiently Active (01/21/2022)   Exercise Vital Sign    Days of Exercise per Week: 4 days    Minutes of Exercise per Session: 60 min  Stress: No Stress Concern Present (01/21/2022)   [employer] of Occupational Health - Occupational Stress Questionnaire    Feeling of Stress : Not at all  Social Connections: Socially Integrated (01/21/2022)   Social Connection and Isolation Panel [NHANES]    Frequency of Communication with Friends and Family: More than three times a week    Frequency of Social Gatherings with Friends and Family: More than three times a week    Attends Religious Services: More than 4 times per year    Active Member of [employer] or Organizations: Yes    Attends Engineer, structural: More than 4 times per year    Marital Status: Married  Catering manager Violence: Not At Risk (01/21/2022)   Humiliation, Afraid, Rape, and Kick questionnaire    Fear of Current or Ex-Partner: No    Emotionally Abused: No    Physically Abused: No    Sexually Abused: No    Past Surgical History:  Procedure Laterality Date   ABDOMINAL HYSTERECTOMY     partial   BREAST IMPLANT REMOVAL      CESAREAN SECTION     COLON SURGERY     had about 1 foot of bowel removed    HEMORRHOID SURGERY     LIPOMA EXCISION N/A 10/23/2014   Procedure: EXCISION OF BACK LIPOMA;  Surgeon: Manus Rudd, MD;  Location: [facility] OR;  Service: General;  Laterality: N/A;   PARTIAL COLECTOMY     PARTIAL HYSTERECTOMY     still has ovaries    PILONIDAL CYST EXCISION     PLACEMENT OF BREAST IMPLANTS     RHINOPLASTY     x 2   SHOULDER SURGERY Right    06    Family History  Problem Relation Age of Onset   Melanoma Mother    Kidney cancer Father    Diabetes Father  Breast cancer Paternal Grandmother    Cancer Maternal Grandmother        possible uterine?     Allergies  Allergen Reactions   Ezetimibe     REACTION: statins make joints ache   Latex Hives   Morphine Sulfate     REACTION: n \\T \ v    Current Outpatient Medications on File Prior to Visit  Medication Sig Dispense Refill   Alum Hydroxide-Mag Carbonate (GAVISCON PO) Take by mouth as needed.      Ascorbic Acid (VITAMIN C WITH ROSE HIPS) 500 MG tablet Take 500 mg by mouth daily.     Cholecalciferol (VITAMIN D3) 50 MCG (2000 UT) TABS Take 1 capsule by mouth daily.      esomeprazole (NEXIUM) 10 MG packet Take 10 mg by mouth daily before breakfast.     fenofibrate (TRICOR) 145 MG tablet TAKE 1 TABLET BY MOUTH EVERY DAY 90 tablet 3   ibuprofen (ADVIL,MOTRIN) 200 MG tablet Take 400 mg by mouth every 6 (six) hours as needed.     metFORMIN (GLUCOPHAGE) 500 MG tablet TAKE 1 TABLET BY MOUTH EVERY DAY WITH BREAKFAST 90 tablet 2   Multiple Vitamins-Minerals (ZINC PO) Take 20 mg by mouth daily.     Omega-3 Fatty Acids (OMEGA 3 PO) Take 1 capsule by mouth daily.     Probiotic Product (PROBIOTIC ADVANCED PO) Take 1 capsule by mouth daily.     SYNTHROID 50 MCG tablet Take 1 tablet (50 mcg total) by mouth daily before breakfast. 90 tablet 3   vitamin B-12 (CYANOCOBALAMIN) 1000 MCG tablet Take 1,000 mcg by mouth daily.     Zinc 30 MG CAPS Take by mouth.      No current facility-administered medications on file prior to visit.    BP 132/80   Pulse 79   Temp 98.7 F (37.1 C) (Oral)   Ht  (1.499 m)   Wt 142 lb (64.4 kg)   SpO2 99%   BMI 28.68 kg/m       Objective:   Physical Exam Vitals and nursing note reviewed.  Constitutional:      General: She is not in acute distress.    Appearance: Normal appearance. She is not ill-appearing.  HENT:     Head: Normocephalic and atraumatic.     Right Ear: Tympanic membrane, ear canal and external ear normal. There is no impacted cerumen.     Left Ear: Tympanic membrane, ear canal and external ear normal. There is no impacted cerumen.     Nose: Nose normal. No congestion or rhinorrhea.     Mouth/Throat:     Mouth: Mucous membranes are moist.     Pharynx: Oropharynx is clear.  Eyes:     Extraocular Movements: Extraocular movements intact.     Conjunctiva/sclera: Conjunctivae normal.     Pupils: Pupils are equal, round, and reactive to light.  Neck:     Vascular: No carotid bruit.  Cardiovascular:     Rate and Rhythm: Normal rate and regular rhythm.     Pulses: Normal pulses.     Heart sounds: No murmur heard.    No friction rub. No gallop.  Pulmonary:     Effort: Pulmonary effort is normal.     Breath sounds: Normal breath sounds.  Abdominal:     General: Abdomen is flat. Bowel sounds are normal. There is no distension.     Palpations: Abdomen is soft. There is no mass.     Tenderness: There is abdominal  tenderness in the right upper quadrant. There is no guarding or rebound. Negative signs include Murphy's sign.     Hernia: No hernia is present.  Musculoskeletal:        General: Normal range of motion.     Cervical back: Normal range of motion and neck supple.  Lymphadenopathy:     Cervical: No cervical adenopathy.  Skin:    General: Skin is warm and dry.     Capillary Refill: Capillary refill takes less than 2 seconds.  Neurological:     General: No focal deficit  present.     Mental Status: She is alert and oriented to person, place, and time.  Psychiatric:        Mood and Affect: Mood normal.        Behavior: Behavior normal.        Thought Content: Thought content normal.        Judgment: Judgment normal.       Assessment & Plan:  1. Routine general medical examination at a health care facility Today patient counseled on age appropriate routine health concerns for screening and prevention, each reviewed and up to date or declined. Immunizations reviewed and up to date or declined. Labs ordered and reviewed. Risk factors for depression reviewed and negative. Hearing function and visual acuity are intact. ADLs screened and addressed as needed. Functional ability and level of safety reviewed and appropriate. Education, counseling and referrals performed based on assessed risks today. Patient provided with a copy of personalized plan for preventive services.   2. Attention deficit hyperactivity disorder (ADHD), combined type - Continue with Adderall 30 mg daily  - CBC with Differential/Platelet; Future - Comprehensive metabolic panel; Future - Lipid panel; Future - TSH; Future - amphetamine-dextroamphetamine (ADDERALL) 30 MG tablet; Take 1 tablet by mouth daily.  Dispense: 30 tablet; Refill: 0 - amphetamine-dextroamphetamine (ADDERALL) 30 MG tablet; Take 1 tablet by mouth daily.  Dispense: 30 tablet; Refill: 0 - amphetamine-dextroamphetamine (ADDERALL) 30 MG tablet; Take 1 tablet by mouth daily.  Dispense: 30 tablet; Refill: 0  3. Pre-diabetes - Continue with metformin  - CBC with Differential/Platelet; Future - Comprehensive metabolic panel; Future - Lipid panel; Future - TSH; Future - Hemoglobin A1c; Future  4. Hypothyroidism, unspecified type - Consider increase in synthroid  - CBC with Differential/Platelet; Future - Comprehensive metabolic panel; Future - Lipid panel; Future - TSH; Future  5. Gastroesophageal reflux disease with  esophagitis without hemorrhage - Continue PPI  - CBC with Differential/Platelet; Future - Comprehensive metabolic panel; Future - Lipid panel; Future - TSH; Future  6. Pure hypercholesterolemia - Continue fenofibrate  - CBC with Differential/Platelet; Future - Comprehensive metabolic panel; Future - Lipid panel; Future - TSH; Future  7. Vitamin D deficiency  - CBC with Differential/Platelet; Future - Comprehensive metabolic panel; Future - Lipid panel; Future - TSH; Future - VITAMIN D 25 Hydroxy (Vit-D Deficiency, Fractures); Future  8. RUQ abdominal pain - Will order [place] to r/o gallbladder disease  - CBC with Differential/Platelet; Future - Comprehensive metabolic panel; Future - Amylase; Future - Lipase; Future - US Abdomen Limited RUQ (LIVER/GB); Future  Shirline Frees, NP

## 2022-05-05 NOTE — Patient Instructions (Signed)
It was great seeing you today   We will follow up with you regarding your lab work   Please let me know if you need anything   

## 2022-05-07 ENCOUNTER — Ambulatory Visit (HOSPITAL_BASED_OUTPATIENT_CLINIC_OR_DEPARTMENT_OTHER)
Admission: RE | Admit: 2022-05-07 | Discharge: 2022-05-07 | Disposition: A | Discharge status: Home / Self Care | Payer: PPO | Source: Ambulatory Visit | Attending: Adult Health | Admitting: Adult Health

## 2022-05-07 DIAGNOSIS — R1011 Right upper quadrant pain: Secondary | ICD-10-CM | POA: Diagnosis not present

## 2022-05-11 ENCOUNTER — Telehealth: Payer: Self-pay | Admitting: Adult Health

## 2022-05-11 DIAGNOSIS — K838 Other specified diseases of biliary tract: Secondary | ICD-10-CM

## 2022-05-11 NOTE — Telephone Encounter (Signed)
Updated patient on [place] results   IMPRESSION: 1. Common bile duct is dilated measuring up to 6.9 mm. Recommend correlation with LFTs. If there is concern for biliary obstruction, recommend MRCP. 2. Increased echogenicity throughout the liver suggestive of steatosis. 3. No cholelithiasis or sonographic evidence for acute cholecystitis.   LFTs were normal

## 2022-05-13 ENCOUNTER — Ambulatory Visit (INDEPENDENT_AMBULATORY_CARE_PROVIDER_SITE_OTHER): Payer: PPO | Admitting: Adult Health

## 2022-05-13 ENCOUNTER — Encounter: Payer: Self-pay | Admitting: Adult Health

## 2022-05-13 VITALS — BP 132/84 | HR 80 | Temp 98.3°F | Ht 59.0 in | Wt 142.0 lb

## 2022-05-13 DIAGNOSIS — L03032 Cellulitis of left toe: Secondary | ICD-10-CM | POA: Diagnosis not present

## 2022-05-13 MED ORDER — FLUCONAZOLE 150 MG PO TABS
150.0000 mg | ORAL_TABLET | Freq: Every day | ORAL | 0 refills | Status: DC
Start: 2022-05-13 — End: 2022-06-01

## 2022-05-13 MED ORDER — DOXYCYCLINE HYCLATE 100 MG PO CAPS
100.0000 mg | ORAL_CAPSULE | Freq: Two times a day (BID) | ORAL | 0 refills | Status: DC
Start: 2022-05-13 — End: 2022-06-01

## 2022-05-13 NOTE — Progress Notes (Signed)
Subjective:    Patient ID: Diana [name], female    DOB: 08-Feb-1950, 72 y.o.   MRN: [id number]  HPI 72 year old female who  has a past medical history of ADD (attention deficit disorder), Cancer, Depression, Diverticulosis, DJD (degenerative joint disease), GERD (gastroesophageal reflux disease), History of hiatal hernia, Mood disorder, SCC (squamous cell carcinoma) in situ (04/22/2010), and STD (sexually transmitted disease).  She presents to the office today for an acute issue of pain to left great big toe that she first noticed about 3 days ago that the nail bed was bruised. She cut back her nail and had " pus come out". Once the pus was released the pain became much improved. She has continues to have some scant drainage. No redness or warmth noted.      Review of Systems See HPI   Past Medical History:  Diagnosis Date   ADD (attention deficit disorder)    Cancer    skin   Depression    Diverticulosis    DJD (degenerative joint disease)    GERD (gastroesophageal reflux disease)    History of hiatal hernia    Mood disorder    SCC (squamous cell carcinoma) in situ 04/22/2010   right forearm   STD (sexually transmitted disease)    HSV II    Social History   Socioeconomic History   Marital status: Married    Spouse name: Not on file   Number of children: 1   Years of education: Not on file   Highest education level: Some college, no degree  Occupational History   Not on file  Tobacco Use   Smoking status: Former    Packs/day: 1.00    Years: 20.00    Additional pack years: 0.00    Total pack years: 20.00    Types: Cigarettes    Quit date: 06/29/2006    Years since quitting: 15.8   Smokeless tobacco: Never  Vaping Use   Vaping Use: Never used  Substance and Sexual Activity   Alcohol use: No    Alcohol/week: 0.0 standard drinks of alcohol    Comment: occ beer   Drug use: No   Sexual activity: Not Currently    Birth control/protection: Post-menopausal   Other Topics Concern   Not on file  Social History Narrative   Not on file   Social Determinants of Health   Financial Resource Strain: Low Risk  (01/21/2022)   Overall Financial Resource Strain (CARDIA)    Difficulty of Paying Living Expenses: Not hard at all  Food Insecurity: No Food Insecurity (01/21/2022)   Hunger Vital Sign    Worried About Running Out of Food in the Last Year: Never true    Ran Out of Food in the Last Year: Never true  Transportation Needs: No Transportation Needs (01/21/2022)   PRAPARE - Administrator, Civil Service (Medical): No    Lack of Transportation (Non-Medical): No  Physical Activity: Sufficiently Active (01/21/2022)   Exercise Vital Sign    Days of Exercise per Week: 4 days    Minutes of Exercise per Session: 60 min  Stress: No Stress Concern Present (01/21/2022)   [employer] of Occupational Health - Occupational Stress Questionnaire    Feeling of Stress : Not at all  Social Connections: Socially Integrated (01/21/2022)   Social Connection and Isolation Panel [NHANES]    Frequency of Communication with Friends and Family: More than three times a week    Frequency of Social  Gatherings with Friends and Family: More than three times a week    Attends Religious Services: More than 4 times per year    Active Member of Clubs or Organizations: Yes    Attends Engineer, structural: More than 4 times per year    Marital Status: Married  Catering manager Violence: Not At Risk (01/21/2022)   Humiliation, Afraid, Rape, and Kick questionnaire    Fear of Current or Ex-Partner: No    Emotionally Abused: No    Physically Abused: No    Sexually Abused: No    Past Surgical History:  Procedure Laterality Date   ABDOMINAL HYSTERECTOMY     partial   BREAST IMPLANT REMOVAL     CESAREAN SECTION     COLON SURGERY     had about 1 foot of bowel removed    HEMORRHOID SURGERY     LIPOMA EXCISION N/A 10/23/2014   Procedure: EXCISION OF BACK LIPOMA;   Surgeon: Manus Rudd, MD;  Location: [facility] OR;  Service: General;  Laterality: N/A;   PARTIAL COLECTOMY     PARTIAL HYSTERECTOMY     still has ovaries    PILONIDAL CYST EXCISION     PLACEMENT OF BREAST IMPLANTS     RHINOPLASTY     x 2   SHOULDER SURGERY Right    06    Family History  Problem Relation Age of Onset   Melanoma Mother    Kidney cancer Father    Diabetes Father    Breast cancer Paternal Grandmother    Cancer Maternal Grandmother        possible uterine?     Allergies  Allergen Reactions   Ezetimibe     REACTION: statins make joints ache   Latex Hives   Morphine Sulfate     REACTION: n \\T \ v    Current Outpatient Medications on File Prior to Visit  Medication Sig Dispense Refill   Alum Hydroxide-Mag Carbonate (GAVISCON PO) Take by mouth as needed.      amphetamine-dextroamphetamine (ADDERALL) 30 MG tablet Take 1 tablet by mouth daily. 30 tablet 0   amphetamine-dextroamphetamine (ADDERALL) 30 MG tablet Take 1 tablet by mouth daily. 30 tablet 0   amphetamine-dextroamphetamine (ADDERALL) 30 MG tablet Take 1 tablet by mouth daily. 30 tablet 0   Ascorbic Acid (VITAMIN C WITH ROSE HIPS) 500 MG tablet Take 500 mg by mouth daily.     Cholecalciferol (VITAMIN D3) 50 MCG (2000 UT) TABS Take 1 capsule by mouth daily.      esomeprazole (NEXIUM) 10 MG packet Take 10 mg by mouth daily before breakfast.     fenofibrate (TRICOR) 145 MG tablet TAKE 1 TABLET BY MOUTH EVERY DAY 90 tablet 3   ibuprofen (ADVIL,MOTRIN) 200 MG tablet Take 400 mg by mouth every 6 (six) hours as needed.     metFORMIN (GLUCOPHAGE) 500 MG tablet TAKE 1 TABLET BY MOUTH EVERY DAY WITH BREAKFAST 90 tablet 2   Multiple Vitamins-Minerals (ZINC PO) Take 20 mg by mouth daily.     Omega-3 Fatty Acids (OMEGA 3 PO) Take 1 capsule by mouth daily.     Probiotic Product (PROBIOTIC ADVANCED PO) Take 1 capsule by mouth daily.     SYNTHROID 50 MCG tablet Take 1 tablet (50 mcg total) by mouth daily before breakfast. 90  tablet 3   vitamin B-12 (CYANOCOBALAMIN) 1000 MCG tablet Take 1,000 mcg by mouth daily.     Zinc 30 MG CAPS Take by mouth.  No current facility-administered medications on file prior to visit.    BP 132/84   Pulse 80   Temp 98.3 F (36.8 C) (Oral)   Ht  (1.499 m)   Wt 142 lb (64.4 kg)   SpO2 99%   BMI 28.68 kg/m       Objective:   Physical Exam Vitals and nursing note reviewed.  Constitutional:      Appearance: Normal appearance.  Feet:     Comments: Bruising noted to left great nail bed. Slight tenderness to lateral aspect of nail bed.  Skin:    General: Skin is warm and dry.  Neurological:     General: No focal deficit present.     Mental Status: She is alert and oriented to person, place, and time.  Psychiatric:        Mood and Affect: Mood normal.        Behavior: Behavior normal.        Thought Content: Thought content normal.        Judgment: Judgment normal.       Assessment & Plan:  1. Paronychia of great toe of left foot - Will cover with Doxycycline.  - Follow up if symptoms worsen.  - Send in Diflucan incase she develops a yeast infection from doxy - doxycycline (VIBRAMYCIN) 100 MG capsule; Take 1 capsule (100 mg total) by mouth 2 (two) times daily.  Dispense: 14 capsule; Refill: 0 - fluconazole (DIFLUCAN) 150 MG tablet; Take 1 tablet (150 mg total) by mouth daily.  Dispense: 1 tablet; Refill: 0  Shirline Frees, NP

## 2022-05-19 ENCOUNTER — Other Ambulatory Visit: Payer: Self-pay | Admitting: Adult Health

## 2022-05-19 DIAGNOSIS — K838 Other specified diseases of biliary tract: Secondary | ICD-10-CM

## 2022-05-20 ENCOUNTER — Ambulatory Visit (HOSPITAL_BASED_OUTPATIENT_CLINIC_OR_DEPARTMENT_OTHER)
Admission: RE | Admit: 2022-05-20 | Discharge: 2022-05-20 | Disposition: A | Discharge status: Home / Self Care | Payer: PPO | Source: Ambulatory Visit | Attending: Adult Health | Admitting: Adult Health

## 2022-05-20 DIAGNOSIS — N281 Cyst of kidney, acquired: Secondary | ICD-10-CM | POA: Diagnosis not present

## 2022-05-20 DIAGNOSIS — K838 Other specified diseases of biliary tract: Secondary | ICD-10-CM | POA: Insufficient documentation

## 2022-05-20 DIAGNOSIS — R935 Abnormal findings on diagnostic imaging of other abdominal regions, including retroperitoneum: Secondary | ICD-10-CM | POA: Diagnosis not present

## 2022-05-20 DIAGNOSIS — K760 Fatty (change of) liver, not elsewhere classified: Secondary | ICD-10-CM | POA: Diagnosis not present

## 2022-05-20 MED ORDER — GADOBUTROL 1 MMOL/ML IV SOLN
6.4000 mL | Freq: Once | INTRAVENOUS | Status: AC | PRN
  Administered 2022-05-20: 6.4 mL via INTRAVENOUS
  Filled 2022-05-20: qty 7.5

## 2022-05-26 ENCOUNTER — Telehealth: Payer: Self-pay | Admitting: Adult Health

## 2022-05-26 ENCOUNTER — Other Ambulatory Visit: Payer: Self-pay | Admitting: Adult Health

## 2022-05-26 ENCOUNTER — Other Ambulatory Visit (INDEPENDENT_AMBULATORY_CARE_PROVIDER_SITE_OTHER): Payer: PPO

## 2022-05-26 DIAGNOSIS — R160 Hepatomegaly, not elsewhere classified: Secondary | ICD-10-CM

## 2022-05-26 NOTE — Telephone Encounter (Signed)
Spoke to patient and informed of MRI of abdomen. Which showed a normal bile duct. She did have some hepatic steatosis and hepatomegaly.   She has tested negative for Hep c in the past. She believes that she had hepatitis B vaccinations in the past.   Will check Acute hepatitis panel and refer to GI for further evaluation.

## 2022-05-27 LAB — HEPATITIS PANEL, ACUTE: Hepatitis C Ab: NONREACTIVE

## 2022-05-28 ENCOUNTER — Telehealth: Payer: Self-pay | Admitting: Adult Health

## 2022-05-28 NOTE — Telephone Encounter (Signed)
Patient notified of update  and verbalized understanding. Pt has been scheduled and advised that if sx worsen to go to local UC. Pt verbalized understanding.

## 2022-05-28 NOTE — Telephone Encounter (Signed)
Please advise 

## 2022-05-28 NOTE — Telephone Encounter (Signed)
Pt seen 05/13/22 for toe infection still sees infection in toe. Wondering if she needs additional medication

## 2022-06-01 ENCOUNTER — Ambulatory Visit (INDEPENDENT_AMBULATORY_CARE_PROVIDER_SITE_OTHER): Payer: PPO | Admitting: Adult Health

## 2022-06-01 ENCOUNTER — Encounter: Payer: Self-pay | Admitting: Nurse Practitioner

## 2022-06-01 ENCOUNTER — Encounter: Payer: Self-pay | Admitting: Adult Health

## 2022-06-01 VITALS — BP 148/72 | HR 78 | Temp 98.1°F | Ht 59.0 in | Wt 142.0 lb

## 2022-06-01 DIAGNOSIS — L03032 Cellulitis of left toe: Secondary | ICD-10-CM

## 2022-06-01 DIAGNOSIS — R030 Elevated blood-pressure reading, without diagnosis of hypertension: Secondary | ICD-10-CM | POA: Diagnosis not present

## 2022-06-01 LAB — HEPATITIS PANEL, ACUTE
Hep A IgM: NONREACTIVE
Hep B C IgM: NONREACTIVE
Hepatitis B Surface Ag: NONREACTIVE

## 2022-06-01 LAB — HEPATITIS E ANTIBODY, IGM: HEPATITIS E ANTIBODY (IGM): NOT DETECTED

## 2022-06-01 NOTE — Progress Notes (Signed)
Subjective:    Patient ID: Diana [name], female    DOB: May 15, 1950, 72 y.o.   MRN: [id number]  HPI 72 year old female who  has a past medical history of ADD (attention deficit disorder), Cancer (HCC), Depression, Diverticulosis, DJD (degenerative joint disease), GERD (gastroesophageal reflux disease), History of hiatal hernia, Mood disorder (HCC), SCC (squamous cell carcinoma) in situ (04/22/2010), and STD (sexually transmitted disease).  She presents to the office today for paronychia of great toe on left foot. She was seen 2 weeks ago and prescribed doxycycline. She reports that she took the entire course of antibiotics. She reports that she has been she is soaking in warm water baths. She feels as though she still have some drainage from the toe. No pain or redness present.    Review of Systems See HPI   Past Medical History:  Diagnosis Date   ADD (attention deficit disorder)    Cancer (HCC)    skin   Depression    Diverticulosis    DJD (degenerative joint disease)    GERD (gastroesophageal reflux disease)    History of hiatal hernia    Mood disorder (HCC)    SCC (squamous cell carcinoma) in situ 04/22/2010   right forearm   STD (sexually transmitted disease)    HSV II    Social History   Socioeconomic History   Marital status: Married    Spouse name: Not on file   Number of children: 1   Years of education: Not on file   Highest education level: Some college, no degree  Occupational History   Not on file  Tobacco Use   Smoking status: Former    Packs/day: 1.00    Years: 20.00    Additional pack years: 0.00    Total pack years: 20.00    Types: Cigarettes    Quit date: 06/29/2006    Years since quitting: 15.9   Smokeless tobacco: Never  Vaping Use   Vaping Use: Never used  Substance and Sexual Activity   Alcohol use: No    Alcohol/week: 0.0 standard drinks of alcohol    Comment: occ beer   Drug use: No   Sexual activity: Not Currently    Birth  control/protection: Post-menopausal  Other Topics Concern   Not on file  Social History Narrative   Not on file   Social Determinants of Health   Financial Resource Strain: Low Risk  (01/21/2022)   Overall Financial Resource Strain (CARDIA)    Difficulty of Paying Living Expenses: Not hard at all  Food Insecurity: No Food Insecurity (01/21/2022)   Hunger Vital Sign    Worried About Running Out of Food in the Last Year: Never true    Ran Out of Food in the Last Year: Never true  Transportation Needs: No Transportation Needs (01/21/2022)   PRAPARE - Administrator, Civil Service (Medical): No    Lack of Transportation (Non-Medical): No  Physical Activity: Sufficiently Active (01/21/2022)   Exercise Vital Sign    Days of Exercise per Week: 4 days    Minutes of Exercise per Session: 60 min  Stress: No Stress Concern Present (01/21/2022)   [employer] of Occupational Health - Occupational Stress Questionnaire    Feeling of Stress : Not at all  Social Connections: Socially Integrated (01/21/2022)   Social Connection and Isolation Panel [NHANES]    Frequency of Communication with Friends and Family: More than three times a week    Frequency of Social  Gatherings with Friends and Family: More than three times a week    Attends Religious Services: More than 4 times per year    Active Member of Clubs or Organizations: Yes    Attends Engineer, structural: More than 4 times per year    Marital Status: Married  Catering manager Violence: Not At Risk (01/21/2022)   Humiliation, Afraid, Rape, and Kick questionnaire    Fear of Current or Ex-Partner: No    Emotionally Abused: No    Physically Abused: No    Sexually Abused: No    Past Surgical History:  Procedure Laterality Date   ABDOMINAL HYSTERECTOMY     partial   BREAST IMPLANT REMOVAL     CESAREAN SECTION     COLON SURGERY     had about 1 foot of bowel removed    HEMORRHOID SURGERY     LIPOMA EXCISION N/A 10/23/2014    Procedure: EXCISION OF BACK LIPOMA;  Surgeon: Manus Rudd, MD;  Location: [facility] OR;  Service: General;  Laterality: N/A;   PARTIAL COLECTOMY     PARTIAL HYSTERECTOMY     still has ovaries    PILONIDAL CYST EXCISION     PLACEMENT OF BREAST IMPLANTS     RHINOPLASTY     x 2   SHOULDER SURGERY Right    06    Family History  Problem Relation Age of Onset   Melanoma Mother    Kidney cancer Father    Diabetes Father    Breast cancer Paternal Grandmother    Cancer Maternal Grandmother        possible uterine?     Allergies  Allergen Reactions   Ezetimibe     REACTION: statins make joints ache   Latex Hives   Morphine Sulfate     REACTION: n \\T \ v    Current Outpatient Medications on File Prior to Visit  Medication Sig Dispense Refill   Alum Hydroxide-Mag Carbonate (GAVISCON PO) Take by mouth as needed.      amphetamine-dextroamphetamine (ADDERALL) 30 MG tablet Take 1 tablet by mouth daily. 30 tablet 0   amphetamine-dextroamphetamine (ADDERALL) 30 MG tablet Take 1 tablet by mouth daily. 30 tablet 0   amphetamine-dextroamphetamine (ADDERALL) 30 MG tablet Take 1 tablet by mouth daily. 30 tablet 0   Ascorbic Acid (VITAMIN C WITH ROSE HIPS) 500 MG tablet Take 500 mg by mouth daily.     Cholecalciferol (VITAMIN D3) 50 MCG (2000 UT) TABS Take 1 capsule by mouth daily.      esomeprazole (NEXIUM) 10 MG packet Take 10 mg by mouth daily before breakfast.     fenofibrate (TRICOR) 145 MG tablet TAKE 1 TABLET BY MOUTH EVERY DAY 90 tablet 3   fluconazole (DIFLUCAN) 150 MG tablet Take 1 tablet (150 mg total) by mouth daily. 1 tablet 0   ibuprofen (ADVIL,MOTRIN) 200 MG tablet Take 400 mg by mouth every 6 (six) hours as needed.     metFORMIN (GLUCOPHAGE) 500 MG tablet TAKE 1 TABLET BY MOUTH EVERY DAY WITH BREAKFAST 90 tablet 2   Multiple Vitamins-Minerals (ZINC PO) Take 20 mg by mouth daily.     Omega-3 Fatty Acids (OMEGA 3 PO) Take 1 capsule by mouth daily.     Probiotic Product (PROBIOTIC  ADVANCED PO) Take 1 capsule by mouth daily.     SYNTHROID 50 MCG tablet Take 1 tablet (50 mcg total) by mouth daily before breakfast. 90 tablet 3   vitamin B-12 (CYANOCOBALAMIN) 1000 MCG tablet Take 1,000  mcg by mouth daily.     Zinc 30 MG CAPS Take by mouth.     No current facility-administered medications on file prior to visit.    BP (!) 148/72   Pulse 78   Temp 98.1 F (36.7 C) (Oral)   Ht 4\' 11"  (1.499 m)   Wt 142 lb (64.4 kg)   SpO2 98%   BMI 28.68 kg/m       Objective:   Physical Exam Vitals and nursing note reviewed.  Constitutional:      Appearance: Normal appearance.  Cardiovascular:     Rate and Rhythm: Normal rate and regular rhythm.     Pulses: Normal pulses.     Heart sounds: Normal heart sounds.  Pulmonary:     Effort: Pulmonary effort is normal.     Breath sounds: Normal breath sounds.  Skin:    General: Skin is warm and dry.  Neurological:     General: No focal deficit present.     Mental Status: She is alert and oriented to person, place, and time.  Psychiatric:        Mood and Affect: Mood normal.        Behavior: Behavior normal.        Thought Content: Thought content normal.        Judgment: Judgment normal.        Assessment & Plan:  1. Paronychia of great toe of left foot - Resolved   2. Elevated blood pressure reading - will have her check her blood pressure at home and send me the results in a few days via mychart. - Consider adding BP medication   Shirline Frees, NP

## 2022-06-04 ENCOUNTER — Other Ambulatory Visit: Payer: Self-pay | Admitting: Adult Health

## 2022-06-04 DIAGNOSIS — E039 Hypothyroidism, unspecified: Secondary | ICD-10-CM

## 2022-06-16 ENCOUNTER — Telehealth: Payer: Self-pay | Admitting: Adult Health

## 2022-06-16 MED ORDER — LISINOPRIL 2.5 MG PO TABS: 2.5000 mg | ORAL_TABLET | Freq: Every day | ORAL | 0 refills | Status: DC

## 2022-06-16 NOTE — Telephone Encounter (Signed)
Patient reporting blood pressure readings, all taken around the same time in the morning.   06/07/22---144/93, 145/84, 146/88  06/08/22---148/84, 148/77, 144/80  06/09/22--148/81, 147/75

## 2022-06-16 NOTE — Addendum Note (Signed)
Addended by: Johnella Moloney on: 06/16/2022 10:47 AM   Modules accepted: Orders

## 2022-06-16 NOTE — Telephone Encounter (Signed)
Spoke with the patient and informed her of the message below.  Patient agreed to begin Lisinopril, is aware the Rx was sent to [facility] and a follow up appt was scheduled for 7/9.

## 2022-07-27 ENCOUNTER — Ambulatory Visit: Payer: PPO | Admitting: Adult Health

## 2022-08-11 ENCOUNTER — Encounter: Payer: Self-pay | Admitting: Adult Health

## 2022-08-11 ENCOUNTER — Ambulatory Visit (INDEPENDENT_AMBULATORY_CARE_PROVIDER_SITE_OTHER): Payer: PPO | Admitting: Adult Health

## 2022-08-11 VITALS — BP 128/80 | HR 75 | Temp 98.8°F | Ht 59.0 in | Wt 138.0 lb

## 2022-08-11 DIAGNOSIS — F902 Attention-deficit hyperactivity disorder, combined type: Secondary | ICD-10-CM

## 2022-08-11 DIAGNOSIS — I10 Essential (primary) hypertension: Secondary | ICD-10-CM | POA: Diagnosis not present

## 2022-08-11 MED ORDER — LISINOPRIL 2.5 MG PO TABS
2.5000 mg | ORAL_TABLET | Freq: Every day | ORAL | 3 refills | Status: DC
Start: 2022-08-11 — End: 2023-08-24

## 2022-08-11 MED ORDER — AMPHETAMINE-DEXTROAMPHETAMINE 30 MG PO TABS: 30.0000 mg | ORAL_TABLET | Freq: Every day | ORAL | 0 refills | Status: DC

## 2022-08-11 MED ORDER — AMPHETAMINE-DEXTROAMPHETAMINE 30 MG PO TABS
30.0000 mg | ORAL_TABLET | Freq: Every day | ORAL | 0 refills | Status: DC
Start: 2022-08-11 — End: 2022-11-04

## 2022-08-11 NOTE — Progress Notes (Signed)
Subjective:    Patient ID: Diana [name], female    DOB: 1950-03-14, 72 y.o.   MRN: [id number]  HPI 72 year old female who  has a past medical history of ADD (attention deficit disorder), Cancer (HCC), Depression, Diverticulosis, DJD (degenerative joint disease), GERD (gastroesophageal reflux disease), History of hiatal hernia, Mood disorder (HCC), SCC (squamous cell carcinoma) in situ (04/22/2010), and STD (sexually transmitted disease).  She presents to the office today for follow up regarding hypertension and ADD   She was placed on lisinopril 2.5 mg daily about two months ago for elevated blood pressure readings. Since starting this medication she has not had any side effects and is tolerating medication well. She denies dizziness or lightheadedness.   ADD - she currently takes Adderall 30 mg daily. She feels well controlled on this medication. Denies anorexia, or palpitations.    Review of Systems See HPI   Past Medical History:  Diagnosis Date   ADD (attention deficit disorder)    Cancer (HCC)    skin   Depression    Diverticulosis    DJD (degenerative joint disease)    GERD (gastroesophageal reflux disease)    History of hiatal hernia    Mood disorder (HCC)    SCC (squamous cell carcinoma) in situ 04/22/2010   right forearm   STD (sexually transmitted disease)    HSV II    Social History   Socioeconomic History   Marital status: Married    Spouse name: Not on file   Number of children: 1   Years of education: Not on file   Highest education level: Some college, no degree  Occupational History   Not on file  Tobacco Use   Smoking status: Former    Current packs/day: 0.00    Average packs/day: 1 pack/day for 20.0 years (20.0 ttl pk-yrs)    Types: Cigarettes    Start date: 06/29/1986    Quit date: 06/29/2006    Years since quitting: 16.1   Smokeless tobacco: Never  Vaping Use   Vaping status: Never Used  Substance and Sexual Activity   Alcohol use: No     Alcohol/week: 0.0 standard drinks of alcohol    Comment: occ beer   Drug use: No   Sexual activity: Not Currently    Birth control/protection: Post-menopausal  Other Topics Concern   Not on file  Social History Narrative   Not on file   Social Determinants of Health   Financial Resource Strain: Low Risk  (08/07/2022)   Overall Financial Resource Strain (CARDIA)    Difficulty of Paying Living Expenses: Not hard at all  Food Insecurity: No Food Insecurity (08/07/2022)   Hunger Vital Sign    Worried About Running Out of Food in the Last Year: Never true    Ran Out of Food in the Last Year: Never true  Transportation Needs: No Transportation Needs (08/07/2022)   PRAPARE - Administrator, Civil Service (Medical): No    Lack of Transportation (Non-Medical): No  Physical Activity: Sufficiently Active (08/07/2022)   Exercise Vital Sign    Days of Exercise per Week: 4 days    Minutes of Exercise per Session: 40 min  Stress: No Stress Concern Present (08/07/2022)   [employer] of Occupational Health - Occupational Stress Questionnaire    Feeling of Stress : Only a little  Social Connections: Socially Integrated (08/07/2022)   Social Connection and Isolation Panel [NHANES]    Frequency of Communication with Friends and  Family: More than three times a week    Frequency of Social Gatherings with Friends and Family: More than three times a week    Attends Religious Services: More than 4 times per year    Active Member of [employer] or Organizations: No    Attends Engineer, structural: More than 4 times per year    Marital Status: Married  Catering manager Violence: Not At Risk (01/21/2022)   Humiliation, Afraid, Rape, and Kick questionnaire    Fear of Current or Ex-Partner: No    Emotionally Abused: No    Physically Abused: No    Sexually Abused: No    Past Surgical History:  Procedure Laterality Date   ABDOMINAL HYSTERECTOMY     partial   BREAST IMPLANT REMOVAL      CESAREAN SECTION     COLON SURGERY     had about 1 foot of bowel removed    HEMORRHOID SURGERY     LIPOMA EXCISION N/A 10/23/2014   Procedure: EXCISION OF BACK LIPOMA;  Surgeon: Manus Rudd, MD;  Location: [facility] OR;  Service: General;  Laterality: N/A;   PARTIAL COLECTOMY     PARTIAL HYSTERECTOMY     still has ovaries    PILONIDAL CYST EXCISION     PLACEMENT OF BREAST IMPLANTS     RHINOPLASTY     x 2   SHOULDER SURGERY Right    06    Family History  Problem Relation Age of Onset   Melanoma Mother    Kidney cancer Father    Diabetes Father    Breast cancer Paternal Grandmother    Cancer Maternal Grandmother        possible uterine?     Allergies  Allergen Reactions   Ezetimibe     REACTION: statins make joints ache   Latex Hives   Morphine Sulfate     REACTION: n \\T \ v    Current Outpatient Medications on File Prior to Visit  Medication Sig Dispense Refill   Alum Hydroxide-Mag Carbonate (GAVISCON PO) Take by mouth as needed.      amphetamine-dextroamphetamine (ADDERALL) 30 MG tablet Take 1 tablet by mouth daily. 30 tablet 0   amphetamine-dextroamphetamine (ADDERALL) 30 MG tablet Take 1 tablet by mouth daily. 30 tablet 0   amphetamine-dextroamphetamine (ADDERALL) 30 MG tablet Take 1 tablet by mouth daily. 30 tablet 0   Ascorbic Acid (VITAMIN C WITH ROSE HIPS) 500 MG tablet Take 500 mg by mouth daily.     Cholecalciferol (VITAMIN D3) 50 MCG (2000 UT) TABS Take 1 capsule by mouth daily.      esomeprazole (NEXIUM) 10 MG packet Take 10 mg by mouth daily before breakfast.     fenofibrate (TRICOR) 145 MG tablet TAKE 1 TABLET BY MOUTH EVERY DAY 90 tablet 3   ibuprofen (ADVIL,MOTRIN) 200 MG tablet Take 400 mg by mouth every 6 (six) hours as needed.     lisinopril (ZESTRIL) 2.5 MG tablet Take 1 tablet (2.5 mg total) by mouth daily. 90 tablet 0   metFORMIN (GLUCOPHAGE) 500 MG tablet TAKE 1 TABLET BY MOUTH EVERY DAY WITH BREAKFAST 90 tablet 2   Multiple Vitamins-Minerals (ZINC PO)  Take 20 mg by mouth daily.     Omega-3 Fatty Acids (OMEGA 3 PO) Take 1 capsule by mouth daily.     Probiotic Product (PROBIOTIC ADVANCED PO) Take 1 capsule by mouth daily.     SYNTHROID 50 MCG tablet TAKE 1 TABLET BY MOUTH DAILY BEFORE BREAKFAST 90 tablet 3  vitamin B-12 (CYANOCOBALAMIN) 1000 MCG tablet Take 1,000 mcg by mouth daily.     Zinc 30 MG CAPS Take by mouth.     No current facility-administered medications on file prior to visit.    BP 128/80   Pulse 75   Temp 98.8 F (37.1 C) (Oral)   Ht 4\' 11"  (1.499 m)   Wt 138 lb (62.6 kg)   SpO2 99%   BMI 27.87 kg/m       Objective:   Physical Exam Vitals and nursing note reviewed.  Constitutional:      Appearance: Normal appearance.  Cardiovascular:     Rate and Rhythm: Normal rate and regular rhythm.     Pulses: Normal pulses.     Heart sounds: Normal heart sounds.  Pulmonary:     Effort: Pulmonary effort is normal.     Breath sounds: Normal breath sounds.  Musculoskeletal:        General: Normal range of motion.  Skin:    General: Skin is warm and dry.  Neurological:     General: No focal deficit present.     Mental Status: She is alert and oriented to person, place, and time.  Psychiatric:        Mood and Affect: Mood normal.        Behavior: Behavior normal.        Thought Content: Thought content normal.        Judgment: Judgment normal.       Assessment & Plan:  1. Essential hypertension - blood pressure controlled with lisinopril 2.5 mg  - No change in medication  - lisinopril (ZESTRIL) 2.5 MG tablet; Take 1 tablet (2.5 mg total) by mouth daily.  Dispense: 90 tablet; Refill: 3  2. Attention deficit hyperactivity disorder (ADHD), combined type  - amphetamine-dextroamphetamine (ADDERALL) 30 MG tablet; Take 1 tablet by mouth daily.  Dispense: 30 tablet; Refill: 0 - amphetamine-dextroamphetamine (ADDERALL) 30 MG tablet; Take 1 tablet by mouth daily.  Dispense: 30 tablet; Refill: 0 -  amphetamine-dextroamphetamine (ADDERALL) 30 MG tablet; Take 1 tablet by mouth daily.  Dispense: 30 tablet; Refill: 0  Shirline Frees, NP  Time spent with patient today was 32 minutes which consisted of chart review, discussing HTN and ADD work up, treatment answering questions and documentation.

## 2022-08-13 ENCOUNTER — Ambulatory Visit: Payer: PPO | Admitting: Nurse Practitioner

## 2022-08-13 ENCOUNTER — Encounter: Payer: Self-pay | Admitting: Nurse Practitioner

## 2022-08-13 VITALS — BP 134/84 | HR 70 | Ht 59.0 in | Wt 138.1 lb

## 2022-08-13 DIAGNOSIS — K760 Fatty (change of) liver, not elsewhere classified: Secondary | ICD-10-CM | POA: Diagnosis not present

## 2022-08-13 DIAGNOSIS — R1011 Right upper quadrant pain: Secondary | ICD-10-CM | POA: Diagnosis not present

## 2022-08-13 DIAGNOSIS — K219 Gastro-esophageal reflux disease without esophagitis: Secondary | ICD-10-CM

## 2022-08-13 DIAGNOSIS — R160 Hepatomegaly, not elsewhere classified: Secondary | ICD-10-CM | POA: Diagnosis not present

## 2022-08-13 NOTE — Progress Notes (Unsigned)
08/13/2022 Diana [name] [id number] 1950/02/29   CHIEF COMPLAINT: Hepatomegaly, GERD, belching   HISTORY OF PRESENT ILLNESS: Diana [name] is a 72 year old female with a past medial history of attention deficit disorder, depression, GERD and divertticulitis s/p sigmoid resection 2009 with subsequent diverticulitis 04/2020. Past hemorrhoid surgery and hysterectomy. She presents to our office today as referred by Elam Dutch, NP for further evaluation regarding hepatomegaly. No history of liver disease. No known family history of liver disease. Normal LFTs 05/05/2022. Labs 05/26/2022: Hepatitis A IgM nonreactive.  Hepatitis B surface antigen nonreactive.  Hepatitis B core IgM nonreactive.  Hepatitis C antibody nonreactive. She denies having any nausea or vomiting. She has some acid reflux symptoms, heartburn and significant burping. She takes Gaviscon PRN.  No longer taking PPI. She endorses having severe acid reflux in the past, sleeps with the head of the bed elevated to prevent nighttime reflux. No dysphagia. She describes having an uncomfortable cramp like ache to the RUQ under the rib cage which lasts for up to 5 minutes then diminishes. Episodes of RUQ pain occur once monthly for the past year. No alcohol use. She is passing normal stools most days, no rectal bleeding or black stools. She underwent a RUQ sonogram 05/07/2022 which showed CBD dilatation measuring 6.9 mm per the radiologist and evidence of hepatic steatosis without gallstones. Noted, normal CBD measures 6 to 7mm. An abdominal MRI/MRCP with and without contrast 05/24/2022 showed the CBD measuring up to 0.79mm with tapered smoothly to the ampulla, mild hepatic steatosis and hepatomegaly with maximum coronal span measuring 20.5cm. She underwent an EGD 07/2014 which was normal. Her most recent colonoscopy was 03/2015 which showed internal hemorrhoids otherwise was unremarkable.      Latest Ref Rng & Units 05/05/2022   11:06 AM  03/17/2021   11:35 AM 04/19/2020    9:42 AM  CBC  WBC 4.0 - 10.5 K/uL 5.2  4.0  9.1   Hemoglobin 12.0 - 15.0 g/dL 11.9  14.7  82.9   Hematocrit 36.0 - 46.0 % 40.7  41.8  38.3   Platelets 150.0 - 400.0 K/uL 259.0  241.0  231        Latest Ref Rng & Units 05/05/2022   11:06 AM 03/17/2021   11:35 AM 04/19/2020    9:42 AM  CMP  Glucose 70 - 99 mg/dL 562  130  865   BUN 6 - 23 mg/dL 17  16  18    Creatinine 0.40 - 1.20 mg/dL 7.84  6.96  2.95   Sodium 135 - 145 mEq/L 140  139  135   Potassium 3.5 - 5.1 mEq/L 4.3  4.1  3.8   Chloride 96 - 112 mEq/L 104  102  101   CO2 19 - 32 mEq/L 26  28  24    Calcium 8.4 - 10.5 mg/dL 9.7  28.4  9.5   Total Protein 6.0 - 8.3 g/dL 7.5  8.1  7.7   Total Bilirubin 0.2 - 1.2 mg/dL 0.4  0.5  0.5   Alkaline Phos 39 - 117 U/L 66  78  86   AST 0 - 37 U/L 24  33  19   ALT 0 - 35 U/L 18  23  16      Acute hepatitis panel negative on 05/26/2022  Abdominal MRI/MRCP with and without contrast 05/20/2022: FINDINGS: Lower chest: No acute abnormality.  Small hiatal hernia.   Hepatobiliary: No solid liver abnormality is seen. Mild hepatic steatosis.  Hepatomegaly, maximum coronal span 20.5 cm. No gallstones, gallbladder wall thickening, or biliary dilatation. The common bile duct measures up to 0.7 cm in caliber, within normal limits for patient age and tapers smoothly to the ampulla.   Pancreas: Unremarkable. No pancreatic ductal dilatation or surrounding inflammatory changes.   Spleen: Normal in size without significant abnormality.   Adrenals/Urinary Tract: Adrenal glands are unremarkable. Simple, benign left renal cortical cysts, for which no further follow-up or characterization is required. Kidneys are otherwise normal, without renal calculi, solid lesion, or hydronephrosis.   Stomach/Bowel: Stomach is within normal limits. No evidence of bowel wall thickening, distention, or inflammatory changes.   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 No enlarged abdominal lymph nodes.   Other: No abdominal wall hernia or abnormality. No ascites.   Musculoskeletal: No acute or significant osseous findings.   IMPRESSION: 1. The common bile duct measures up to 0.7 cm in caliber, within normal limits for patient age and tapers smoothly to the ampulla. No evidence of calculus or other obstructing etiology. 2. Hepatomegaly and mild hepatic steatosis. 3. Small hiatal hernia.  RUQ sonogram 05/07/2022: FINDINGS: Gallbladder: No gallstones or wall thickening visualized. No sonographic Murphy sign noted by sonographer.   Common bile duct: Diameter: 6.9 mm, dilated.   Liver: Increased echogenicity. No focal lesion. Portal vein is patent on color Doppler imaging with normal direction of blood flow towards the liver.   IMPRESSION: 1. Common bile duct is dilated measuring up to 6.9 mm. Recommend correlation with LFTs. If there is concern for biliary obstruction, recommend MRCP. 2. Increased echogenicity throughout the liver suggestive of steatosis. 3. No cholelithiasis or sonographic evidence for acute cholecystitis.  PAST GI PROCEDURES:  EGD 07/24/2014 by Dr. Juanda Chance: 1. The mucosa of the esophagus appeared normal  2. The mucosa of the stomach appeared normal  3   Normal duodenal bulb and descending duodenum   Colonoscopy 04/01/2015: - Non-bleeding internal hemorrhoids.  - The entire examined colon is normal.  - No specimens collected. - Recall colonoscopy 10 years   Colonoscopy 2009 Normal except for sigmoid narrowing with diverticulosis    Past Medical History:  Diagnosis Date   ADD (attention deficit disorder)    Cancer (HCC)    skin   Depression    Diverticulosis    DJD (degenerative joint disease)    GERD (gastroesophageal reflux disease)    History of hiatal hernia    Mood disorder (HCC)    SCC (squamous cell carcinoma) in situ 04/22/2010   right forearm    STD (sexually transmitted disease)    HSV II   Past Surgical History:  Procedure Laterality Date   ABDOMINAL HYSTERECTOMY     partial   BREAST IMPLANT REMOVAL     CESAREAN SECTION     COLON SURGERY     had about 1 foot of bowel removed    HEMORRHOID SURGERY     LIPOMA EXCISION N/A 10/23/2014   Procedure: EXCISION OF BACK LIPOMA;  Surgeon: Manus Rudd, MD;  Location: [facility] OR;  Service: General;  Laterality: N/A;   PARTIAL COLECTOMY     PARTIAL HYSTERECTOMY     still has ovaries    PILONIDAL CYST EXCISION     PLACEMENT OF BREAST IMPLANTS     RHINOPLASTY     x 2   SHOULDER SURGERY Right    06   Social History: She is married. She has one daughter. Past smoker, quit smoking cigarettes 16  years ago. No alcohol use. No drug use.   Family History: family history includes Breast cancer in her paternal grandmother; Cancer in her maternal grandmother; Diabetes in her father; Kidney cancer in her father; Melanoma in her mother.  Allergies  Allergen Reactions   Ezetimibe     REACTION: statins make joints ache   Latex Hives   Morphine Sulfate     REACTION: n \\T \ v      Outpatient Encounter Medications as of 08/13/2022  Medication Sig   Alum Hydroxide-Mag Carbonate (GAVISCON PO) Take by mouth as needed.    amphetamine-dextroamphetamine (ADDERALL) 30 MG tablet Take 1 tablet by mouth daily.   amphetamine-dextroamphetamine (ADDERALL) 30 MG tablet Take 1 tablet by mouth daily.   amphetamine-dextroamphetamine (ADDERALL) 30 MG tablet Take 1 tablet by mouth daily.   Ascorbic Acid (VITAMIN C WITH ROSE HIPS) 500 MG tablet Take 500 mg by mouth daily.   Cholecalciferol (VITAMIN D3) 50 MCG (2000 UT) TABS Take 1 capsule by mouth daily.    esomeprazole (NEXIUM) 10 MG packet Take 10 mg by mouth daily before breakfast.   fenofibrate (TRICOR) 145 MG tablet TAKE 1 TABLET BY MOUTH EVERY DAY   ibuprofen (ADVIL,MOTRIN) 200 MG tablet Take 400 mg by mouth every 6 (six) hours as needed.   lisinopril  (ZESTRIL) 2.5 MG tablet Take 1 tablet (2.5 mg total) by mouth daily.   metFORMIN (GLUCOPHAGE) 500 MG tablet TAKE 1 TABLET BY MOUTH EVERY DAY WITH BREAKFAST   Multiple Vitamins-Minerals (ZINC PO) Take 20 mg by mouth daily.   Omega-3 Fatty Acids (OMEGA 3 PO) Take 1 capsule by mouth daily.   Probiotic Product (PROBIOTIC ADVANCED PO) Take 1 capsule by mouth daily.   SYNTHROID 50 MCG tablet TAKE 1 TABLET BY MOUTH DAILY BEFORE BREAKFAST   vitamin B-12 (CYANOCOBALAMIN) 1000 MCG tablet Take 1,000 mcg by mouth daily.   Zinc 30 MG CAPS Take by mouth.   No facility-administered encounter medications on file as of 08/13/2022.   REVIEW OF SYSTEMS:  Gen: Denies fever, sweats or chills. No weight loss.  CV: Denies chest pain, palpitations or edema. Resp: Denies cough, shortness of breath of hemoptysis.  GI: See HPI. GU : Denies urinary burning, blood in urine, increased urinary frequency or incontinence. MS: Denies joint pain, muscles aches or weakness. Derm: Denies rash, itchiness, skin lesions or unhealing ulcers. Psych: Denies depression, anxiety, memory loss or confusion. Heme: Denies bruising, easy bleeding. Neuro:  Denies headaches, dizziness or paresthesias. Endo:  Denies any problems with DM, thyroid or adrenal function.  PHYSICAL EXAM: Ht 4\' 11"  (1.499 m)   Wt 138 lb 2 oz (62.7 kg)   BMI 27.90 kg/m   Wt Readings from Last 3 Encounters:  08/13/22 138 lb 2 oz (62.7 kg)  08/11/22 138 lb (62.6 kg)  06/01/22 142 lb (64.4 kg)    General: 72 year old female in no acute distress. Head: Normocephalic and atraumatic. Eyes:  Sclerae non-icteric, conjunctive pink. Ears: Normal auditory acuity. Mouth: Dentition intact. No ulcers or lesions.  Neck: Supple, no lymphadenopathy or thyromegaly.  Lungs: Clear bilaterally to auscultation without wheezes, crackles or rhonchi. Heart: Regular rate and rhythm. No murmur, rub or gallop appreciated.  Abdomen: Soft, nontender, nondistended. No masses. No  hepatosplenomegaly. Normoactive bowel sounds x 4 quadrants.  Rectal: Deferred.  Musculoskeletal: Symmetrical with no gross deformities. Skin: Warm and dry. No rash or lesions on visible extremities. Extremities: No edema. Neurological: Alert oriented x 4, no focal deficits.  Psychological:  Alert  and cooperative. Normal mood and affect.  ASSESSMENT AND PLAN:  72 year old female with hepatomegaly and hepatic steatosis per sono and CT imaging. Normal LFTs. Normal Hep A, B and C serologies.  -Reduce carbohydrates in diet ie: bread/pasta/potato/rice and sweet, exercise as tolerated and avoid weight gain to reduce the risk of developing fatty liver diease -Repeat hepatic panel at time of annual physical with PCP  RUQ pain. Sono and CTAP findings did not identify etiology for RUQ pain.  -Famotidine 20mg  one tab po Q HS -EGD if no improvement  -Patient to contact office if RUQ pain recurs. Check CBC and CMP with next episode of RUQ pain.   GERD with increased burping -Famotidine 20mg  one tab po Q HS -Gas X one tab po bid -If no improvement in 2 weeks recommend scheduling an EGD to rule out barrett's esophagus and PUD and CCK HIDA to rule out biliary dyskinesia   History of perforated diverticulitis s/p sigmoid resection 2009 with subsequent diverticulitis   Colon cancer screening. Last colonoscopy 03/2015, no polyp.  -Next colonoscopy due 03/2025    CC:  Shirline Frees, NP

## 2022-08-13 NOTE — Patient Instructions (Addendum)
Purchase these medications over the counter:  GasX- 1 tablet by mouth twice daily  Pepcid 20 mg- 1 tablet at bedtime  Contact Colleen, NP with an update in 2-3 weeks.  Contact our office if you right upper quadrant pain recurs.  Due to recent changes in healthcare laws, you may see the results of your imaging and laboratory studies on MyChart before your provider has had a chance to review them.  We understand that in some cases there may be results that are confusing or concerning to you. Not all laboratory results come back in the same time frame and the provider may be waiting for multiple results in order to interpret others.  Please give [place] 48 hours in order for your provider to thoroughly review all the results before contacting the office for clarification of your results.   Thank you for trusting me with your gastrointestinal care!   Alcide Evener, CRNP

## 2022-08-27 ENCOUNTER — Telehealth: Payer: Self-pay | Admitting: Nurse Practitioner

## 2022-08-27 NOTE — Telephone Encounter (Signed)
Great update

## 2022-08-27 NOTE — Telephone Encounter (Signed)
Inbound call from patient stating she is doing great and is feeling better. Also stated chest is also feeling better. Wanted to inform this information.

## 2022-08-30 DIAGNOSIS — Z85828 Personal history of other malignant neoplasm of skin: Secondary | ICD-10-CM | POA: Diagnosis not present

## 2022-08-30 DIAGNOSIS — D2272 Melanocytic nevi of left lower limb, including hip: Secondary | ICD-10-CM | POA: Diagnosis not present

## 2022-08-30 DIAGNOSIS — L814 Other melanin hyperpigmentation: Secondary | ICD-10-CM | POA: Diagnosis not present

## 2022-08-30 DIAGNOSIS — D225 Melanocytic nevi of trunk: Secondary | ICD-10-CM | POA: Diagnosis not present

## 2022-08-30 DIAGNOSIS — L821 Other seborrheic keratosis: Secondary | ICD-10-CM | POA: Diagnosis not present

## 2022-08-30 DIAGNOSIS — D1801 Hemangioma of skin and subcutaneous tissue: Secondary | ICD-10-CM | POA: Diagnosis not present

## 2022-08-30 DIAGNOSIS — L570 Actinic keratosis: Secondary | ICD-10-CM | POA: Diagnosis not present

## 2022-08-30 DIAGNOSIS — D2271 Melanocytic nevi of right lower limb, including hip: Secondary | ICD-10-CM | POA: Diagnosis not present

## 2022-11-04 ENCOUNTER — Encounter: Payer: Self-pay | Admitting: Adult Health

## 2022-11-04 ENCOUNTER — Ambulatory Visit: Payer: PPO | Admitting: Adult Health

## 2022-11-04 VITALS — BP 128/82 | HR 96 | Temp 98.1°F | Ht 59.0 in | Wt 138.0 lb

## 2022-11-04 DIAGNOSIS — F902 Attention-deficit hyperactivity disorder, combined type: Secondary | ICD-10-CM | POA: Diagnosis not present

## 2022-11-04 DIAGNOSIS — R7303 Prediabetes: Secondary | ICD-10-CM

## 2022-11-04 LAB — POCT GLYCOSYLATED HEMOGLOBIN (HGB A1C): Hemoglobin A1C: 5.9 % — AB (ref 4.0–5.6)

## 2022-11-04 MED ORDER — AMPHETAMINE-DEXTROAMPHETAMINE 30 MG PO TABS
30.0000 mg | ORAL_TABLET | Freq: Every day | ORAL | 0 refills | Status: DC
Start: 2022-11-04 — End: 2023-02-10

## 2022-11-04 MED ORDER — METFORMIN HCL 500 MG PO TABS: 500.0000 mg | ORAL_TABLET | Freq: Every day | ORAL | 1 refills | Status: DC

## 2022-11-04 MED ORDER — AMPHETAMINE-DEXTROAMPHETAMINE 30 MG PO TABS
30.0000 mg | ORAL_TABLET | Freq: Every day | ORAL | 0 refills | Status: DC
Start: 2022-11-04 — End: 2023-02-09

## 2022-11-04 NOTE — Progress Notes (Signed)
Subjective:    Patient ID: Diana [name], female    DOB: 1950-08-12, 72 y.o.   MRN: [id number]  HPI  73 year old female who  has a past medical history of ADD (attention deficit disorder), Cancer (HCC), Depression, Diverticulosis, DJD (degenerative joint disease), GERD (gastroesophageal reflux disease), History of hiatal hernia, Mood disorder (HCC), SCC (squamous cell carcinoma) in situ (04/22/2010), and STD (sexually transmitted disease).  She presents to the office today for follow up regarding DM and ADD  Pre Diabetes -last A1c was 6.2.  Currently managed with Metformin 500 mg daily. She is tolerating this medication well. She is staying active and eating healthy.  Lab Results  Component Value Date   HGBA1C 5.9 (A) 11/04/2022   HGBA1C 6.2 05/05/2022   HGBA1C 5.7 (A) 12/02/2021   ADD- managed with Adderall 30 mg daily. She feels well controlled. Denies anorexia, insomnia, or palpitations.    BP Readings from Last 3 Encounters:  11/04/22 (!) 120/100  08/13/22 134/84  08/11/22 128/80    Review of Systems See HPI   Past Medical History:  Diagnosis Date   ADD (attention deficit disorder)    Cancer (HCC)    skin   Depression    Diverticulosis    DJD (degenerative joint disease)    GERD (gastroesophageal reflux disease)    History of hiatal hernia    Mood disorder (HCC)    SCC (squamous cell carcinoma) in situ 04/22/2010   right forearm   STD (sexually transmitted disease)    HSV II    Social History   Socioeconomic History   Marital status: Married    Spouse name: Not on file   Number of children: 1   Years of education: Not on file   Highest education level: Some college, no degree  Occupational History   Not on file  Tobacco Use   Smoking status: Former    Current packs/day: 0.00    Average packs/day: 1 pack/day for 20.0 years (20.0 ttl pk-yrs)    Types: Cigarettes    Start date: 06/29/1986    Quit date: 06/29/2006    Years since quitting: 16.3    Smokeless tobacco: Never  Vaping Use   Vaping status: Never Used  Substance and Sexual Activity   Alcohol use: No    Alcohol/week: 0.0 standard drinks of alcohol    Comment: occ beer   Drug use: No   Sexual activity: Not Currently    Birth control/protection: Post-menopausal  Other Topics Concern   Not on file  Social History Narrative   Not on file   Social Determinants of Health   Financial Resource Strain: Low Risk  (08/07/2022)   Overall Financial Resource Strain (CARDIA)    Difficulty of Paying Living Expenses: Not hard at all  Food Insecurity: No Food Insecurity (08/07/2022)   Hunger Vital Sign    Worried About Running Out of Food in the Last Year: Never true    Ran Out of Food in the Last Year: Never true  Transportation Needs: No Transportation Needs (08/07/2022)   PRAPARE - Administrator, Civil Service (Medical): No    Lack of Transportation (Non-Medical): No  Physical Activity: Sufficiently Active (08/07/2022)   Exercise Vital Sign    Days of Exercise per Week: 4 days    Minutes of Exercise per Session: 40 min  Stress: No Stress Concern Present (08/07/2022)   [employer] of Occupational Health - Occupational Stress Questionnaire    Feeling of  Stress : Only a little  Social Connections: Socially Integrated (08/07/2022)   Social Connection and Isolation Panel [NHANES]    Frequency of Communication with Friends and Family: More than three times a week    Frequency of Social Gatherings with Friends and Family: More than three times a week    Attends Religious Services: More than 4 times per year    Active Member of [employer] or Organizations: No    Attends Engineer, structural: More than 4 times per year    Marital Status: Married  Catering manager Violence: Not At Risk (01/21/2022)   Humiliation, Afraid, Rape, and Kick questionnaire    Fear of Current or Ex-Partner: No    Emotionally Abused: No    Physically Abused: No    Sexually Abused: No     Past Surgical History:  Procedure Laterality Date   ABDOMINAL HYSTERECTOMY     partial   BREAST IMPLANT REMOVAL     CESAREAN SECTION     COLON SURGERY     had about 1 foot of bowel removed    HEMORRHOID SURGERY     LIPOMA EXCISION N/A 10/23/2014   Procedure: EXCISION OF BACK LIPOMA;  Surgeon: Manus Rudd, MD;  Location: [facility] OR;  Service: General;  Laterality: N/A;   PARTIAL COLECTOMY     PARTIAL HYSTERECTOMY     still has ovaries    PILONIDAL CYST EXCISION     PLACEMENT OF BREAST IMPLANTS     RHINOPLASTY     x 2   SHOULDER SURGERY Right    06    Family History  Problem Relation Age of Onset   Melanoma Mother    Kidney cancer Father    Diabetes Father    Cancer Maternal Grandmother        possible uterine?    Breast cancer Paternal Grandmother    Colon cancer Neg Hx    Esophageal cancer Neg Hx    Pancreatic cancer Neg Hx    Stomach cancer Neg Hx     Allergies  Allergen Reactions   Ezetimibe     REACTION: statins make joints ache   Latex Hives   Morphine Sulfate     REACTION: n \\T \ v    Current Outpatient Medications on File Prior to Visit  Medication Sig Dispense Refill   Alum Hydroxide-Mag Carbonate (GAVISCON PO) Take by mouth as needed.      amphetamine-dextroamphetamine (ADDERALL) 30 MG tablet Take 1 tablet by mouth daily. 30 tablet 0   amphetamine-dextroamphetamine (ADDERALL) 30 MG tablet Take 1 tablet by mouth daily. 30 tablet 0   amphetamine-dextroamphetamine (ADDERALL) 30 MG tablet Take 1 tablet by mouth daily. 30 tablet 0   Ascorbic Acid (VITAMIN C WITH ROSE HIPS) 500 MG tablet Take 500 mg by mouth daily.     Cholecalciferol (VITAMIN D3) 50 MCG (2000 UT) TABS Take 1 capsule by mouth daily.      esomeprazole (NEXIUM) 10 MG packet Take 10 mg by mouth daily before breakfast.     fenofibrate (TRICOR) 145 MG tablet TAKE 1 TABLET BY MOUTH EVERY DAY 90 tablet 3   ibuprofen (ADVIL,MOTRIN) 200 MG tablet Take 400 mg by mouth every 6 (six) hours as needed.      lisinopril (ZESTRIL) 2.5 MG tablet Take 1 tablet (2.5 mg total) by mouth daily. 90 tablet 3   metFORMIN (GLUCOPHAGE) 500 MG tablet TAKE 1 TABLET BY MOUTH EVERY DAY WITH BREAKFAST 90 tablet 2   Multiple Vitamins-Minerals (ZINC PO)  Take 20 mg by mouth daily.     Omega-3 Fatty Acids (OMEGA 3 PO) Take 1 capsule by mouth daily.     Probiotic Product (PROBIOTIC ADVANCED PO) Take 1 capsule by mouth daily.     SYNTHROID 50 MCG tablet TAKE 1 TABLET BY MOUTH DAILY BEFORE BREAKFAST 90 tablet 3   vitamin B-12 (CYANOCOBALAMIN) 1000 MCG tablet Take 1,000 mcg by mouth daily.     Zinc 30 MG CAPS Take by mouth.     No current facility-administered medications on file prior to visit.    BP (!) 120/100   Pulse 96   Temp 98.1 F (36.7 C) (Oral)   Ht 4\' 11"  (1.499 m)   Wt 138 lb (62.6 kg)   SpO2 98%   BMI 27.87 kg/m       Objective:   Physical Exam Vitals and nursing note reviewed.  Constitutional:      Appearance: Normal appearance.  Cardiovascular:     Rate and Rhythm: Normal rate and regular rhythm.     Pulses: Normal pulses.     Heart sounds: Normal heart sounds.  Pulmonary:     Effort: Pulmonary effort is normal.     Breath sounds: Normal breath sounds.  Skin:    General: Skin is warm and dry.  Neurological:     General: No focal deficit present.     Mental Status: She is alert and oriented to person, place, and time.  Psychiatric:        Mood and Affect: Mood normal.        Behavior: Behavior normal.        Thought Content: Thought content normal.        Judgment: Judgment normal.       Assessment & Plan:  1. Attention deficit hyperactivity disorder (ADHD), combined type - Well controlled.  - amphetamine-dextroamphetamine (ADDERALL) 30 MG tablet; Take 1 tablet by mouth daily.  Dispense: 30 tablet; Refill: 0 - amphetamine-dextroamphetamine (ADDERALL) 30 MG tablet; Take 1 tablet by mouth daily.  Dispense: 30 tablet; Refill: 0 - amphetamine-dextroamphetamine (ADDERALL) 30 MG  tablet; Take 1 tablet by mouth daily.  Dispense: 30 tablet; Refill: 0  2. Pre-diabetes  - POC HgB A1c- 5.7 - well controlled. No change in medication  - metFORMIN (GLUCOPHAGE) 500 MG tablet; Take 1 tablet (500 mg total) by mouth daily with breakfast.  Dispense: 90 tablet; Refill: 1  Shirline Frees, NP

## 2023-01-28 ENCOUNTER — Ambulatory Visit (INDEPENDENT_AMBULATORY_CARE_PROVIDER_SITE_OTHER): Payer: PPO

## 2023-01-28 VITALS — Ht 59.0 in | Wt 135.0 lb

## 2023-01-28 DIAGNOSIS — Z0000 Encounter for general adult medical examination without abnormal findings: Secondary | ICD-10-CM | POA: Diagnosis not present

## 2023-01-28 NOTE — Progress Notes (Signed)
 Subjective:   Diana [name] is a 73 y.o. female who presents for Medicare Annual (Subsequent) preventive examination.  Visit Complete: Virtual I connected with  [name] Fees on 01/28/23 by a audio enabled telemedicine application and verified that I am speaking with the correct person using two identifiers.  Patient Location: Home  Provider Location: Home Office  I discussed the limitations of evaluation and management by telemedicine. The patient expressed understanding and agreed to proceed.  Vital Signs: Because this visit was a virtual/telehealth visit, some criteria may be missing or patient reported. Any vitals not documented were not able to be obtained and vitals that have been documented are patient reported.    Cardiac Risk Factors include: advanced age (>53men, >45 women);hypertension     Objective:    Today's Vitals   01/28/23 1131  Weight: 135 lb (61.2 kg)  Height: 4' 11 (1.499 m)   Body mass index is 27.27 kg/m.     01/28/2023   11:37 AM 01/21/2022   11:31 AM 01/20/2021   11:33 AM 04/19/2020    9:38 AM 01/01/2020    2:54 PM 10/16/2015    4:06 PM 04/01/2015    3:03 PM  Advanced Directives  Does Patient Have a Medical Advance Directive? Yes Yes Yes Yes Yes Yes Yes  Type of Estate Agent of [place];Living will Healthcare Power of [place];Living will Healthcare Power of [place];Living will Living will;Healthcare Power of [employer] Power of [place];Living will Healthcare Power of [place];Living will Healthcare Power of [place];Living will  Does patient want to make changes to medical advance directive?   No - Patient declined  No - Patient declined No - Patient declined   Copy of Healthcare Power of Attorney in Chart? No - copy requested No - copy requested No - copy requested  No - copy requested Yes     Current Medications (verified) Outpatient Encounter Medications as of 01/28/2023  Medication Sig   Alum Hydroxide-Mag  Carbonate (GAVISCON PO) Take by mouth as needed.    amphetamine -dextroamphetamine  (ADDERALL) 30 MG tablet Take 1 tablet by mouth daily.   amphetamine -dextroamphetamine  (ADDERALL) 30 MG tablet Take 1 tablet by mouth daily.   amphetamine -dextroamphetamine  (ADDERALL) 30 MG tablet Take 1 tablet by mouth daily.   Ascorbic Acid (VITAMIN C WITH ROSE HIPS) 500 MG tablet Take 500 mg by mouth daily.   Cholecalciferol (VITAMIN D3) 50 MCG (2000 UT) TABS Take 1 capsule by mouth daily.    esomeprazole (NEXIUM) 10 MG packet Take 10 mg by mouth daily before breakfast.   fenofibrate  (TRICOR ) 145 MG tablet TAKE 1 TABLET BY MOUTH EVERY DAY   ibuprofen (ADVIL,MOTRIN) 200 MG tablet Take 400 mg by mouth every 6 (six) hours as needed.   lisinopril  (ZESTRIL ) 2.5 MG tablet Take 1 tablet (2.5 mg total) by mouth daily.   metFORMIN  (GLUCOPHAGE ) 500 MG tablet Take 1 tablet (500 mg total) by mouth daily with breakfast.   Multiple Vitamins-Minerals (ZINC PO) Take 20 mg by mouth daily.   Omega-3 Fatty Acids (OMEGA 3 PO) Take 1 capsule by mouth daily.   Probiotic Product (PROBIOTIC ADVANCED PO) Take 1 capsule by mouth daily.   SYNTHROID  50 MCG tablet TAKE 1 TABLET BY MOUTH DAILY BEFORE BREAKFAST   vitamin B-12 (CYANOCOBALAMIN ) 1000 MCG tablet Take 1,000 mcg by mouth daily.   Zinc 30 MG CAPS Take by mouth.   No facility-administered encounter medications on file as of 01/28/2023.    Allergies (verified) Ezetimibe, Latex, and Morphine sulfate  History: Past Medical History:  Diagnosis Date   ADD (attention deficit disorder)    Cancer (HCC)    skin   Depression    Diverticulosis    DJD (degenerative joint disease)    GERD (gastroesophageal reflux disease)    History of hiatal hernia    Mood disorder (HCC)    SCC (squamous cell carcinoma) in situ 04/22/2010   right forearm   STD (sexually transmitted disease)    HSV II   Past Surgical History:  Procedure Laterality Date   ABDOMINAL HYSTERECTOMY     partial    BREAST IMPLANT REMOVAL     CESAREAN SECTION     COLON SURGERY     had about 1 foot of bowel removed    HEMORRHOID SURGERY     LIPOMA EXCISION N/A 10/23/2014   Procedure: EXCISION OF BACK LIPOMA;  Surgeon: Donnice Lima, MD;  Location: [facility] OR;  Service: General;  Laterality: N/A;   PARTIAL COLECTOMY     PARTIAL HYSTERECTOMY     still has ovaries    PILONIDAL CYST EXCISION     PLACEMENT OF BREAST IMPLANTS     RHINOPLASTY     x 2   SHOULDER SURGERY Right    06   Family History  Problem Relation Age of Onset   Melanoma Mother    Kidney cancer Father    Diabetes Father    Cancer Maternal Grandmother        possible uterine?    Breast cancer Paternal Grandmother    Colon cancer Neg Hx    Esophageal cancer Neg Hx    Pancreatic cancer Neg Hx    Stomach cancer Neg Hx    Social History   Socioeconomic History   Marital status: Married    Spouse name: Not on file   Number of children: 1   Years of education: Not on file   Highest education level: Some college, no degree  Occupational History   Not on file  Tobacco Use   Smoking status: Former    Current packs/day: 0.00    Average packs/day: 1 pack/day for 20.0 years (20.0 ttl pk-yrs)    Types: Cigarettes    Start date: 06/29/1986    Quit date: 06/29/2006    Years since quitting: 16.5   Smokeless tobacco: Never  Vaping Use   Vaping status: Never Used  Substance and Sexual Activity   Alcohol use: No    Alcohol/week: 0.0 standard drinks of alcohol    Comment: occ beer   Drug use: No   Sexual activity: Not Currently    Birth control/protection: Post-menopausal  Other Topics Concern   Not on file  Social History Narrative   Not on file   Social Drivers of Health   Financial Resource Strain: Low Risk  (01/28/2023)   Overall Financial Resource Strain (CARDIA)    Difficulty of Paying Living Expenses: Not hard at all  Food Insecurity: No Food Insecurity (01/28/2023)   Hunger Vital Sign    Worried About Running Out of Food  in the Last Year: Never true    Ran Out of Food in the Last Year: Never true  Transportation Needs: No Transportation Needs (01/28/2023)   PRAPARE - Administrator, Civil Service (Medical): No    Lack of Transportation (Non-Medical): No  Physical Activity: Insufficiently Active (01/28/2023)   Exercise Vital Sign    Days of Exercise per Week: 4 days    Minutes of Exercise per Session: 30  min  Stress: No Stress Concern Present (01/28/2023)   [employer] of Occupational Health - Occupational Stress Questionnaire    Feeling of Stress : Not at all  Social Connections: Socially Integrated (01/28/2023)   Social Connection and Isolation Panel [NHANES]    Frequency of Communication with Friends and Family: More than three times a week    Frequency of Social Gatherings with Friends and Family: More than three times a week    Attends Religious Services: More than 4 times per year    Active Member of [employer] or Organizations: Yes    Attends Engineer, Structural: More than 4 times per year    Marital Status: Married    Tobacco Counseling Counseling given: Not Answered   Clinical Intake:  Pre-visit preparation completed: Yes  Pain : No/denies pain     BMI - recorded: 27.27 Nutritional Status: BMI 25 -29 Overweight Nutritional Risks: None Diabetes: No  How often do you need to have someone help you when you read instructions, pamphlets, or other written materials from your doctor or pharmacy?: 1 - Never  Interpreter Needed?: No  Information entered by :: Rojelio Blush LPN   Activities of Daily Living    01/28/2023   11:36 AM  In your present state of health, do you have any difficulty performing the following activities:  Hearing? 0  Vision? 0  Difficulty concentrating or making decisions? 0  Walking or climbing stairs? 0  Dressing or bathing? 0  Doing errands, shopping? 0  Preparing Food and eating ? N  Using the Toilet? N  In the past six months,  have you accidently leaked urine? N  Do you have problems with loss of bowel control? N  Managing your Medications? N  Managing your Finances? N  Housekeeping or managing your Housekeeping? N    Patient Care Team: Merna Huxley, NP as PCP - General (Family Medicine) Camella Fallow, MD as Consulting Physician (Orthopedic Surgery) Liane Sharyne MATSU, [facility] (Inactive) as Pharmacist (Pharmacist)  Indicate any recent Medical Services you may have received from other than [facility] providers in the past year (date may be approximate).     Assessment:   This is a routine wellness examination for Makyna.  Hearing/Vision screen Hearing Screening - Comments:: Denies hearing difficulties   Vision Screening - Comments:: Wears rx glasses - up to date with routine eye exams with  [facility]   Goals Addressed               This Visit's Progress     Increase physical activity (pt-stated)        Stay Active!       Depression Screen    01/28/2023   11:35 AM 05/06/2022    9:37 AM 01/21/2022   11:28 AM 03/17/2021   11:01 AM 01/20/2021   11:26 AM 01/07/2021   11:06 AM 01/01/2020    2:59 PM  PHQ 2/9 Scores  PHQ - 2 Score 0 5 0 0 0 0 0  PHQ- 9 Score  12  2 0 6 0    Fall Risk    01/28/2023   11:36 AM 08/07/2022    3:36 PM 07/26/2022   11:47 AM 01/21/2022   11:30 AM 01/18/2022    9:32 AM  Fall Risk   Falls in the past year? 0 0 0 0 0  Number falls in past yr: 0   0 0  Injury with Fall? 0   0   Risk for fall  due to : No Fall Risks   No Fall Risks   Follow up Falls prevention discussed   Falls prevention discussed     MEDICARE RISK AT HOME: Medicare Risk at Home Any stairs in or around the home?: Yes If so, are there any without handrails?: No Home free of loose throw rugs in walkways, pet beds, electrical cords, etc?: Yes Adequate lighting in your home to reduce risk of falls?: Yes Life alert?: No Use of a cane, walker or w/c?: No Grab bars in the bathroom?: Yes Shower chair or  bench in shower?: Yes Elevated toilet seat or a handicapped toilet?: No  TIMED UP AND GO:  Was the test performed?  No    Cognitive Function:        01/28/2023   11:37 AM 01/21/2022   11:31 AM 01/20/2021   11:31 AM  6CIT Screen  What Year? 0 points 0 points 0 points  What month? 0 points 0 points 0 points  What time? 0 points 0 points 0 points  Count back from 20 0 points 0 points 0 points  Months in reverse 0 points 0 points 0 points  Repeat phrase 0 points 0 points 0 points  Total Score 0 points 0 points 0 points    Immunizations Immunization History  Administered Date(s) Administered   Fluad Quad(high Dose 65+) 10/26/2018, 09/29/2021   Influenza Split 09/28/2012   Influenza Whole 11/05/2004, 09/18/2008   Influenza, High Dose Seasonal PF 09/02/2014, 10/13/2015, 11/18/2016, 10/20/2017, 10/29/2019, 10/06/2020, 09/24/2022   Influenza-Unspecified 10/18/2013   PFIZER(Purple Top)SARS-COV-2 Vaccination 08/18/2019, 09/08/2019   Pneumococcal Conjugate-13 05/05/2015   Pneumococcal Polysaccharide-23 03/21/2007, 02/11/2017   Td 01/19/2003   Tdap 05/05/2015   Zoster, Live 03/27/2013    TDAP status: Up to date  Flu Vaccine status: Up to date  Pneumococcal vaccine status: Up to date  Covid-19 vaccine status: Declined, Education has been provided regarding the importance of this vaccine but patient still declined. Advised may receive this vaccine at local pharmacy or Health Dept.or vaccine clinic. Aware to provide a copy of the vaccination record if obtained from local pharmacy or Health Dept. Verbalized acceptance and understanding.  Qualifies for Shingles Vaccine? Yes   Zostavax completed No   Shingrix Completed?: No.    Education has been provided regarding the importance of this vaccine. Patient has been advised to call insurance company to determine out of pocket expense if they have not yet received this vaccine. Advised may also receive vaccine at local pharmacy or Health Dept.  Verbalized acceptance and understanding.  Screening Tests Health Maintenance  Topic Date Due   Zoster Vaccines- Shingrix (1 of 2) 04/07/1998   MAMMOGRAM  07/10/2021   COVID-19 Vaccine (3 - 2024-25 season) 09/19/2022   Medicare Annual Wellness (AWV)  01/28/2024   Colonoscopy  03/31/2025   DTaP/Tdap/Td (3 - Td or Tdap) 05/04/2025   Pneumonia Vaccine 54+ Years old  Completed   INFLUENZA VACCINE  Completed   DEXA SCAN  Completed   Hepatitis C Screening  Completed   HPV VACCINES  Aged Out    Health Maintenance  Health Maintenance Due  Topic Date Due   Zoster Vaccines- Shingrix (1 of 2) 04/07/1998   MAMMOGRAM  07/10/2021   COVID-19 Vaccine (3 - 2024-25 season) 09/19/2022    Colorectal cancer screening: Type of screening: Colonoscopy. Completed 04/01/15. Repeat every 10 years  Mammogram status: Ordered Deferred. Pt provided with contact info and advised to call to schedule appt.   Bone Density status:  Completed 04/30/21. Results reflect: Bone density results: OSTEOPENIA. Repeat every   years.    Additional Screening:  Hepatitis C Screening: does qualify; Completed 05/26/22  Vision Screening: Recommended annual ophthalmology exams for early detection of glaucoma and other disorders of the eye. Is the patient up to date with their annual eye exam?  Yes  Who is the provider or what is the name of the office in which the patient attends annual eye exams? [facility] If pt is not established with a provider, would they like to be referred to a provider to establish care? No .   Dental Screening: Recommended annual dental exams for proper oral hygiene    Community Resource Referral / Chronic Care Management:  CRR required this visit?  No   CCM required this visit?  No     Plan:     I have personally reviewed and noted the following in the patient's chart:   Medical and social history Use of alcohol, tobacco or illicit drugs  Current medications and supplements  including opioid prescriptions. Patient is not currently taking opioid prescriptions. Functional ability and status Nutritional status Physical activity Advanced directives List of other physicians Hospitalizations, surgeries, and ER visits in previous 12 months Vitals Screenings to include cognitive, depression, and falls Referrals and appointments  In addition, I have reviewed and discussed with patient certain preventive protocols, quality metrics, and best practice recommendations. A written personalized care plan for preventive services as well as general preventive health recommendations were provided to patient.     Rojelio LELON Blush, LPN   [id number]/[id number]   After Visit Summary: (MyChart) Due to this being a telephonic visit, the after visit summary with patients personalized plan was offered to patient via MyChart   Nurse Notes: None

## 2023-01-28 NOTE — Patient Instructions (Addendum)
 Diana [name] , Thank you for taking time to come for your Medicare Wellness Visit. I appreciate your ongoing commitment to your health goals. Please review the following plan we discussed and let me know if I can assist you in the future.   Referrals/Orders/Follow-Ups/Clinician Recommendations:   This is a list of the screening recommended for you and due dates:  Health Maintenance  Topic Date Due   Zoster (Shingles) Vaccine (1 of 2) 04/07/1998   Mammogram  07/10/2021   COVID-19 Vaccine (3 - 2024-25 season) 09/19/2022   Medicare Annual Wellness Visit  01/28/2024   Colon Cancer Screening  03/31/2025   DTaP/Tdap/Td vaccine (3 - Td or Tdap) 05/04/2025   Pneumonia Vaccine  Completed   Flu Shot  Completed   DEXA scan (bone density measurement)  Completed   Hepatitis C Screening  Completed   HPV Vaccine  Aged Out    Advanced directives: (Copy Requested) Please bring a copy of your health care power of attorney and living will to the office to be added to your chart at your convenience.  Next Medicare Annual Wellness Visit scheduled for next year: Yes

## 2023-02-09 ENCOUNTER — Other Ambulatory Visit: Payer: Self-pay | Admitting: Adult Health

## 2023-02-09 DIAGNOSIS — F902 Attention-deficit hyperactivity disorder, combined type: Secondary | ICD-10-CM

## 2023-02-09 DIAGNOSIS — Z1231 Encounter for screening mammogram for malignant neoplasm of breast: Secondary | ICD-10-CM

## 2023-02-10 ENCOUNTER — Ambulatory Visit
Admission: RE | Admit: 2023-02-10 | Discharge: 2023-02-10 | Disposition: A | Discharge status: Home / Self Care | Payer: PPO | Source: Ambulatory Visit | Attending: Adult Health | Admitting: Adult Health

## 2023-02-10 DIAGNOSIS — Z1231 Encounter for screening mammogram for malignant neoplasm of breast: Secondary | ICD-10-CM

## 2023-02-10 MED ORDER — AMPHETAMINE-DEXTROAMPHETAMINE 30 MG PO TABS
30.0000 mg | ORAL_TABLET | Freq: Every day | ORAL | 0 refills | Status: DC
Start: 2023-02-10 — End: 2023-05-10

## 2023-02-10 NOTE — Telephone Encounter (Signed)
Okay for refill?  

## 2023-02-26 ENCOUNTER — Other Ambulatory Visit: Payer: Self-pay | Admitting: Adult Health

## 2023-02-26 DIAGNOSIS — E7800 Pure hypercholesterolemia, unspecified: Secondary | ICD-10-CM

## 2023-05-07 ENCOUNTER — Other Ambulatory Visit: Payer: Self-pay | Admitting: Adult Health

## 2023-05-07 DIAGNOSIS — E039 Hypothyroidism, unspecified: Secondary | ICD-10-CM

## 2023-05-10 ENCOUNTER — Ambulatory Visit (INDEPENDENT_AMBULATORY_CARE_PROVIDER_SITE_OTHER): Payer: PPO | Admitting: Adult Health

## 2023-05-10 VITALS — BP 120/80 | HR 74 | Temp 98.4°F | Ht 59.0 in | Wt 135.0 lb

## 2023-05-10 DIAGNOSIS — R7303 Prediabetes: Secondary | ICD-10-CM

## 2023-05-10 DIAGNOSIS — E559 Vitamin D deficiency, unspecified: Secondary | ICD-10-CM

## 2023-05-10 DIAGNOSIS — I10 Essential (primary) hypertension: Secondary | ICD-10-CM

## 2023-05-10 DIAGNOSIS — Z0000 Encounter for general adult medical examination without abnormal findings: Secondary | ICD-10-CM

## 2023-05-10 DIAGNOSIS — E785 Hyperlipidemia, unspecified: Secondary | ICD-10-CM

## 2023-05-10 DIAGNOSIS — K2100 Gastro-esophageal reflux disease with esophagitis, without bleeding: Secondary | ICD-10-CM

## 2023-05-10 DIAGNOSIS — E039 Hypothyroidism, unspecified: Secondary | ICD-10-CM | POA: Diagnosis not present

## 2023-05-10 DIAGNOSIS — F902 Attention-deficit hyperactivity disorder, combined type: Secondary | ICD-10-CM | POA: Diagnosis not present

## 2023-05-10 LAB — HEMOGLOBIN A1C: Hgb A1c MFr Bld: 6.1 % (ref 4.6–6.5)

## 2023-05-10 LAB — CBC WITH DIFFERENTIAL/PLATELET
Basophils Absolute: 0 10*3/uL (ref 0.0–0.1)
Basophils Relative: 1 % (ref 0.0–3.0)
Eosinophils Absolute: 0.1 10*3/uL (ref 0.0–0.7)
Eosinophils Relative: 2 % (ref 0.0–5.0)
HCT: 40.8 % (ref 36.0–46.0)
Hemoglobin: 13.7 g/dL (ref 12.0–15.0)
Lymphocytes Relative: 41.7 % (ref 12.0–46.0)
Lymphs Abs: 2 10*3/uL (ref 0.7–4.0)
MCHC: 33.6 g/dL (ref 30.0–36.0)
MCV: 89 fl (ref 78.0–100.0)
Monocytes Absolute: 0.3 10*3/uL (ref 0.1–1.0)
Monocytes Relative: 6.5 % (ref 3.0–12.0)
Neutro Abs: 2.4 10*3/uL (ref 1.4–7.7)
Neutrophils Relative %: 48.8 % (ref 43.0–77.0)
Platelets: 258 10*3/uL (ref 150.0–400.0)
RBC: 4.59 Mil/uL (ref 3.87–5.11)
RDW: 13.7 % (ref 11.5–15.5)
WBC: 4.8 10*3/uL (ref 4.0–10.5)

## 2023-05-10 LAB — LIPID PANEL
Cholesterol: 162 mg/dL (ref 0–200)
HDL: 40.4 mg/dL (ref >39.00)
LDL Cholesterol: 93 mg/dL (ref 0–99)
NonHDL: 122.03
Total CHOL/HDL Ratio: 4
Triglycerides: 147 mg/dL (ref 0.0–149.0)
VLDL: 29.4 mg/dL (ref 0.0–40.0)

## 2023-05-10 LAB — COMPREHENSIVE METABOLIC PANEL WITH GFR
ALT: 18 U/L (ref 0–35)
AST: 25 U/L (ref 0–37)
Albumin: 4.8 g/dL (ref 3.5–5.2)
Alkaline Phosphatase: 66 U/L (ref 39–117)
BUN: 20 mg/dL (ref 6–23)
CO2: 26 meq/L (ref 19–32)
Calcium: 10 mg/dL (ref 8.4–10.5)
Chloride: 104 meq/L (ref 96–112)
Creatinine, Ser: 0.83 mg/dL (ref 0.40–1.20)
GFR: 69.12 mL/min (ref >60.00)
Glucose, Bld: 108 mg/dL — ABNORMAL HIGH (ref 70–99)
Potassium: 4 meq/L (ref 3.5–5.1)
Sodium: 139 meq/L (ref 135–145)
Total Bilirubin: 0.5 mg/dL (ref 0.2–1.2)
Total Protein: 7.7 g/dL (ref 6.0–8.3)

## 2023-05-10 LAB — TSH: TSH: 1.66 u[IU]/mL (ref 0.35–5.50)

## 2023-05-10 LAB — VITAMIN D 25 HYDROXY (VIT D DEFICIENCY, FRACTURES): VITD: 44.97 ng/mL (ref 30.00–100.00)

## 2023-05-10 MED ORDER — AMPHETAMINE-DEXTROAMPHETAMINE 30 MG PO TABS
30.0000 mg | ORAL_TABLET | Freq: Every day | ORAL | 0 refills | Status: DC
Start: 2023-05-10 — End: 2023-08-10

## 2023-05-10 MED ORDER — SYNTHROID 50 MCG PO TABS
50.0000 ug | ORAL_TABLET | Freq: Every day | ORAL | 3 refills | Status: AC
Start: 2023-05-10 — End: ?

## 2023-05-10 MED ORDER — AMPHETAMINE-DEXTROAMPHETAMINE 30 MG PO TABS
30.0000 mg | ORAL_TABLET | Freq: Every day | ORAL | 0 refills | Status: DC
Start: 2023-05-10 — End: 2023-08-09

## 2023-05-10 MED ORDER — METFORMIN HCL 500 MG PO TABS
500.0000 mg | ORAL_TABLET | Freq: Every day | ORAL | 1 refills | Status: DC
Start: 2023-05-10 — End: 2023-11-23

## 2023-05-10 NOTE — Progress Notes (Signed)
 Subjective:    Patient ID: Diana [name], female    DOB: 1950-11-16, 73 y.o.   MRN: [id number]  HPI  Patient presents for yearly preventative medicine examination. She is a pleasant 73 year old female who  has a past medical history of ADD (attention deficit disorder), Cancer (HCC), Depression, Diverticulosis, DJD (degenerative joint disease), GERD (gastroesophageal reflux disease), History of hiatal hernia, Mood disorder (HCC), SCC (squamous cell carcinoma) in situ (04/22/2010), and STD (sexually transmitted disease).  Hyperlipidemia-currently prescribed Tricor  145 mg daily.  She has been intolerant to many statins in the past.  She denies myalgia or fatigue Lab Results  Component Value Date   CHOL 178 05/05/2022   HDL 48.90 05/05/2022   LDLCALC 97 05/05/2022   LDLDIRECT 137.0 01/02/2019   TRIG 161.0 (H) 05/05/2022   CHOLHDL 4 05/05/2022   Pre Diabetes -last A1c was 6.2.  Currently managed with Metformin  500 mg daily.  Lab Results  Component Value Date   HGBA1C 5.9 (A) 11/04/2022   HGBA1C 6.2 05/05/2022   HGBA1C 5.7 (A) 12/02/2021   Hypertension - managed with lisinopril  2.5 mg daily. She denies dizziness, lightheadedness, blurred vision, or headaches.  BP Readings from Last 3 Encounters:  05/10/23 120/80  11/04/22 128/82  08/13/22 134/84   Hypothyroidism-currently controlled with Synthroid  50 mcg daily.  She does feel well controlled  Lab Results  Component Value Date   TSH 1.78 05/05/2022   Vitamin D  Deficiency - takes Vitamin D  2000 units daily  Last vitamin D  Last vitamin D  Lab Results  Component Value Date   VD25OH 33.95 05/05/2022   GERD- takes Nexium 10 mg PRN   ADD- managed with Adderall 30 mg daily. She feels well controlled. Denies anorexia, insomnia, or palpitations.    All immunizations and health maintenance protocols were reviewed with the patient and needed orders were placed.  Appropriate screening laboratory values were ordered for the patient  including screening of hyperlipidemia, renal function and hepatic function.   Medication reconciliation,  past medical history, social history, problem list and allergies were reviewed in detail with the patient  Goals were established with regard to weight loss, exercise, and  diet in compliance with medications. She does stay active and eat healthy.   Wt Readings from Last 3 Encounters:  05/10/23 135 lb (61.2 kg)  01/28/23 135 lb (61.2 kg)  11/04/22 138 lb (62.6 kg)     She is  up to date on routine colon cancer screening   Review of Systems  Constitutional: Negative.   HENT: Negative.    Eyes: Negative.   Respiratory: Negative.    Cardiovascular: Negative.   Gastrointestinal: Negative.   Endocrine: Negative.   Genitourinary: Negative.   Musculoskeletal: Negative.   Skin: Negative.   Allergic/Immunologic: Negative.   Neurological: Negative.   Hematological: Negative.   Psychiatric/Behavioral: Negative.     Past Medical History:  Diagnosis Date   ADD (attention deficit disorder)    Cancer (HCC)    skin   Depression    Diverticulosis    DJD (degenerative joint disease)    GERD (gastroesophageal reflux disease)    History of hiatal hernia    Mood disorder (HCC)    SCC (squamous cell carcinoma) in situ 04/22/2010   right forearm   STD (sexually transmitted disease)    HSV II    Social History   Socioeconomic History   Marital status: Married    Spouse name: Not on file   Number of children: 1  Years of education: Not on file   Highest education level: Some college, no degree  Occupational History   Not on file  Tobacco Use   Smoking status: Former    Current packs/day: 0.00    Average packs/day: 1 pack/day for 20.0 years (20.0 ttl pk-yrs)    Types: Cigarettes    Start date: 06/29/1986    Quit date: 06/29/2006    Years since quitting: 16.8   Smokeless tobacco: Never  Vaping Use   Vaping status: Never Used  Substance and Sexual Activity   Alcohol use:  No    Alcohol/week: 0.0 standard drinks of alcohol    Comment: occ beer   Drug use: No   Sexual activity: Not Currently    Birth control/protection: Post-menopausal  Other Topics Concern   Not on file  Social History Narrative   Not on file   Social Drivers of Health   Financial Resource Strain: Low Risk  (05/10/2023)   Overall Financial Resource Strain (CARDIA)    Difficulty of Paying Living Expenses: Not hard at all  Food Insecurity: No Food Insecurity (05/10/2023)   Hunger Vital Sign    Worried About Running Out of Food in the Last Year: Never true    Ran Out of Food in the Last Year: Never true  Transportation Needs: No Transportation Needs (05/10/2023)   PRAPARE - Administrator, Civil Service (Medical): No    Lack of Transportation (Non-Medical): No  Physical Activity: Insufficiently Active (05/10/2023)   Exercise Vital Sign    Days of Exercise per Week: 3 days    Minutes of Exercise per Session: 30 min  Stress: Stress Concern Present (05/10/2023)   [employer] of Occupational Health - Occupational Stress Questionnaire    Feeling of Stress : To some extent  Social Connections: Socially Integrated (05/10/2023)   Social Connection and Isolation Panel [NHANES]    Frequency of Communication with Friends and Family: More than three times a week    Frequency of Social Gatherings with Friends and Family: Twice a week    Attends Religious Services: More than 4 times per year    Active Member of [employer] or Organizations: No    Attends Engineer, structural: More than 4 times per year    Marital Status: Married  Catering manager Violence: Not At Risk (01/28/2023)   Humiliation, Afraid, Rape, and Kick questionnaire    Fear of Current or Ex-Partner: No    Emotionally Abused: No    Physically Abused: No    Sexually Abused: No    Past Surgical History:  Procedure Laterality Date   ABDOMINAL HYSTERECTOMY     partial   BREAST IMPLANT REMOVAL     CESAREAN  SECTION     COLON SURGERY     had about 1 foot of bowel removed    HEMORRHOID SURGERY     LIPOMA EXCISION N/A 10/23/2014   Procedure: EXCISION OF BACK LIPOMA;  Surgeon: Dareen Ebbing, MD;  Location: [facility] OR;  Service: General;  Laterality: N/A;   PARTIAL COLECTOMY     PARTIAL HYSTERECTOMY     still has ovaries    PILONIDAL CYST EXCISION     PLACEMENT OF BREAST IMPLANTS     RHINOPLASTY     x 2   SHOULDER SURGERY Right    06    Family History  Problem Relation Age of Onset   Melanoma Mother    Kidney cancer Father    Diabetes Father  Cancer Maternal Grandmother        possible uterine?    Breast cancer Paternal Grandmother    Colon cancer Neg Hx    Esophageal cancer Neg Hx    Pancreatic cancer Neg Hx    Stomach cancer Neg Hx     Allergies  Allergen Reactions   Ezetimibe     REACTION: statins make joints ache   Latex Hives   Morphine Sulfate     REACTION: n \\T \ v    Current Outpatient Medications on File Prior to Visit  Medication Sig Dispense Refill   Alum Hydroxide-Mag Carbonate (GAVISCON PO) Take by mouth as needed.      amphetamine -dextroamphetamine  (ADDERALL) 30 MG tablet Take 1 tablet by mouth daily. 30 tablet 0   amphetamine -dextroamphetamine  (ADDERALL) 30 MG tablet Take 1 tablet by mouth daily. 30 tablet 0   amphetamine -dextroamphetamine  (ADDERALL) 30 MG tablet Take 1 tablet by mouth daily. 30 tablet 0   Ascorbic Acid (VITAMIN C WITH ROSE HIPS) 500 MG tablet Take 500 mg by mouth daily.     Cholecalciferol (VITAMIN D3) 50 MCG (2000 UT) TABS Take 1 capsule by mouth daily.      esomeprazole (NEXIUM) 10 MG packet Take 10 mg by mouth daily before breakfast.     fenofibrate  (TRICOR ) 145 MG tablet TAKE 1 TABLET BY MOUTH EVERY DAY 90 tablet 3   ibuprofen (ADVIL,MOTRIN) 200 MG tablet Take 400 mg by mouth every 6 (six) hours as needed.     lisinopril  (ZESTRIL ) 2.5 MG tablet Take 1 tablet (2.5 mg total) by mouth daily. 90 tablet 3   metFORMIN  (GLUCOPHAGE ) 500 MG tablet  Take 1 tablet (500 mg total) by mouth daily with breakfast. 90 tablet 1   Multiple Vitamins-Minerals (ZINC PO) Take 20 mg by mouth daily.     Omega-3 Fatty Acids (OMEGA 3 PO) Take 1 capsule by mouth daily.     Probiotic Product (PROBIOTIC ADVANCED PO) Take 1 capsule by mouth daily.     SYNTHROID  50 MCG tablet TAKE 1 TABLET BY MOUTH DAILY BEFORE BREAKFAST 90 tablet 3   vitamin B-12 (CYANOCOBALAMIN ) 1000 MCG tablet Take 1,000 mcg by mouth daily.     Zinc 30 MG CAPS Take by mouth.     No current facility-administered medications on file prior to visit.    BP 120/80   Pulse 74   Temp 98.4 F (36.9 C) (Oral)   Ht 4\' 11"  (1.499 m)   Wt 135 lb (61.2 kg)   SpO2 94%   BMI 27.27 kg/m       Objective:   Physical Exam Vitals and nursing note reviewed.  Constitutional:      General: She is not in acute distress.    Appearance: Normal appearance. She is not ill-appearing.  HENT:     Head: Normocephalic and atraumatic.     Right Ear: Tympanic membrane, ear canal and external ear normal. There is no impacted cerumen.     Left Ear: Tympanic membrane, ear canal and external ear normal. There is no impacted cerumen.     Nose: Nose normal. No congestion or rhinorrhea.     Mouth/Throat:     Mouth: Mucous membranes are moist.     Pharynx: Oropharynx is clear.  Eyes:     Extraocular Movements: Extraocular movements intact.     Conjunctiva/sclera: Conjunctivae normal.     Pupils: Pupils are equal, round, and reactive to light.  Neck:     Vascular: No carotid bruit.  Cardiovascular:  Rate and Rhythm: Normal rate and regular rhythm.     Pulses: Normal pulses.     Heart sounds: No murmur heard.    No friction rub. No gallop.  Pulmonary:     Effort: Pulmonary effort is normal.     Breath sounds: Normal breath sounds.  Abdominal:     General: Abdomen is flat. Bowel sounds are normal. There is no distension.     Palpations: Abdomen is soft. There is no mass.     Tenderness: There is no  abdominal tenderness. There is no guarding or rebound.     Hernia: No hernia is present.  Musculoskeletal:        General: Normal range of motion.     Cervical back: Normal range of motion and neck supple.  Lymphadenopathy:     Cervical: No cervical adenopathy.  Skin:    General: Skin is warm and dry.     Capillary Refill: Capillary refill takes less than 2 seconds.  Neurological:     General: No focal deficit present.     Mental Status: She is alert and oriented to person, place, and time.  Psychiatric:        Mood and Affect: Mood normal.        Behavior: Behavior normal.        Thought Content: Thought content normal.        Judgment: Judgment normal.        Assessment & Plan:  1. Routine general medical examination at a health care facility (Primary) Today patient counseled on age appropriate routine health concerns for screening and prevention, each reviewed and up to date or declined. Immunizations reviewed and up to date or declined. Labs ordered and reviewed. Risk factors for depression reviewed and negative. Hearing function and visual acuity are intact. ADLs screened and addressed as needed. Functional ability and level of safety reviewed and appropriate. Education, counseling and referrals performed based on assessed risks today. Patient provided with a copy of personalized plan for preventive services. - Follow up in one year or sooner if needed - Eat healthy and stay active   2. Hyperlipidemia, unspecified hyperlipidemia type - Continue with Tricor .  - CBC with Differential/Platelet; Future - Comprehensive metabolic panel with GFR; Future - Lipid panel; Future - TSH; Future - TSH - CBC with Differential/Platelet - Comprehensive metabolic panel with GFR - Lipid panel  3. Pre-diabetes - Continue with Metformin   - CBC with Differential/Platelet; Future - Comprehensive metabolic panel with GFR; Future - Lipid panel; Future - TSH; Future - Hemoglobin A1c;  Future - metFORMIN  (GLUCOPHAGE ) 500 MG tablet; Take 1 tablet (500 mg total) by mouth daily with breakfast.  Dispense: 90 tablet; Refill: 1 - TSH - Hemoglobin A1c - CBC with Differential/Platelet - Comprehensive metabolic panel with GFR - Lipid panel  4. Essential hypertension - Well controlled. No change in medication  - CBC with Differential/Platelet; Future - Comprehensive metabolic panel with GFR; Future - Lipid panel; Future - TSH; Future - TSH - CBC with Differential/Platelet - Comprehensive metabolic panel with GFR - Lipid panel  5. Hypothyroidism, unspecified type - Continue with Synthroid  50 mcg  - CBC with Differential/Platelet; Future - Comprehensive metabolic panel with GFR; Future - Lipid panel; Future - TSH; Future - SYNTHROID  50 MCG tablet; Take 1 tablet (50 mcg total) by mouth daily before breakfast.  Dispense: 90 tablet; Refill: 3 - TSH - CBC with Differential/Platelet - Comprehensive metabolic panel with GFR - Lipid panel  6. Vitamin D   deficiency  - VITAMIN D  25 Hydroxy (Vit-D Deficiency, Fractures); Future - VITAMIN D  25 Hydroxy (Vit-D Deficiency, Fractures)  7. Gastroesophageal reflux disease with esophagitis without hemorrhage - Continue with Nexium   8. Attention deficit hyperactivity disorder (ADHD), combined type  - amphetamine -dextroamphetamine  (ADDERALL) 30 MG tablet; Take 1 tablet by mouth daily.  Dispense: 30 tablet; Refill: 0 - amphetamine -dextroamphetamine  (ADDERALL) 30 MG tablet; Take 1 tablet by mouth daily.  Dispense: 30 tablet; Refill: 0 - amphetamine -dextroamphetamine  (ADDERALL) 30 MG tablet; Take 1 tablet by mouth daily.  Dispense: 30 tablet; Refill: 0  Alto Atta, NP

## 2023-05-11 ENCOUNTER — Encounter: Payer: Self-pay | Admitting: Adult Health

## 2023-08-09 ENCOUNTER — Other Ambulatory Visit: Payer: Self-pay | Admitting: Adult Health

## 2023-08-09 DIAGNOSIS — F902 Attention-deficit hyperactivity disorder, combined type: Secondary | ICD-10-CM

## 2023-08-09 NOTE — Telephone Encounter (Unsigned)
 Copied from CRM #[id number]. Topic: Clinical - Medication Refill >> Aug 09, 2023  4:29 PM Thersia C wrote: Medication: amphetamine -dextroamphetamine  (ADDERALL) 30 MG tablet  Has the patient contacted their pharmacy? Yes (Agent: If no, request that the patient contact the pharmacy for the refill. If patient does not wish to contact the pharmacy document the reason why and proceed with request.) (Agent: If yes, when and what did the pharmacy advise?)  This is the patient's preferred pharmacy:  [id number] [address] [facility] - [address] [place] [address] Phone: [phone] Fax: [phone]  Is this the correct pharmacy for this prescription? Yes If no, delete pharmacy and type the correct one.   Has the prescription been filled recently? No  Is the patient out of the medication? Yes  Has the patient been seen for an appointment in the last year OR does the patient have an upcoming appointment? Yes  Can we respond through MyChart? Yes  Agent: Please be advised that Rx refills may take up to 3 business days. We ask that you follow-up with your pharmacy.

## 2023-08-10 MED ORDER — AMPHETAMINE-DEXTROAMPHETAMINE 30 MG PO TABS
30.0000 mg | ORAL_TABLET | Freq: Every day | ORAL | 0 refills | Status: DC
Start: 2023-08-10 — End: 2023-11-09

## 2023-08-24 ENCOUNTER — Other Ambulatory Visit: Payer: Self-pay | Admitting: Adult Health

## 2023-08-24 DIAGNOSIS — I10 Essential (primary) hypertension: Secondary | ICD-10-CM

## 2023-09-01 DIAGNOSIS — L570 Actinic keratosis: Secondary | ICD-10-CM | POA: Diagnosis not present

## 2023-09-01 DIAGNOSIS — L821 Other seborrheic keratosis: Secondary | ICD-10-CM | POA: Diagnosis not present

## 2023-09-01 DIAGNOSIS — D2271 Melanocytic nevi of right lower limb, including hip: Secondary | ICD-10-CM | POA: Diagnosis not present

## 2023-09-01 DIAGNOSIS — D225 Melanocytic nevi of trunk: Secondary | ICD-10-CM | POA: Diagnosis not present

## 2023-09-01 DIAGNOSIS — L820 Inflamed seborrheic keratosis: Secondary | ICD-10-CM | POA: Diagnosis not present

## 2023-09-01 DIAGNOSIS — D2272 Melanocytic nevi of left lower limb, including hip: Secondary | ICD-10-CM | POA: Diagnosis not present

## 2023-09-01 DIAGNOSIS — D1801 Hemangioma of skin and subcutaneous tissue: Secondary | ICD-10-CM | POA: Diagnosis not present

## 2023-09-01 DIAGNOSIS — L814 Other melanin hyperpigmentation: Secondary | ICD-10-CM | POA: Diagnosis not present

## 2023-11-09 ENCOUNTER — Other Ambulatory Visit: Payer: Self-pay | Admitting: Adult Health

## 2023-11-09 DIAGNOSIS — F902 Attention-deficit hyperactivity disorder, combined type: Secondary | ICD-10-CM

## 2023-11-09 MED ORDER — AMPHETAMINE-DEXTROAMPHETAMINE 30 MG PO TABS: 30.0000 mg | ORAL_TABLET | Freq: Every day | ORAL | 0 refills | Status: DC

## 2023-11-09 NOTE — Telephone Encounter (Signed)
 Copied from CRM [id number]. Topic: Clinical - Medication Refill >> Nov 09, 2023 10:24 AM Diana [name] wrote: Medication: amphetamine -dextroamphetamine  (ADDERALL) 30 MG tablet  Patient requesting 90-day supply  Has the patient contacted their pharmacy? No, states it is a controlled substance (Agent: If no, request that the patient contact the pharmacy for the refill. If patient does not wish to contact the pharmacy document the reason why and proceed with request.) (Agent: If yes, when and what did the pharmacy advise?)  This is the patient's preferred pharmacy:  [id number] [address] [facility] - [address] [place] [address] Phone: [phone] Fax: [phone]  Is this the correct pharmacy for this prescription? Yes If no, delete pharmacy and type the correct one.   Has the prescription been filled recently? No  Is the patient out of the medication? Yes  Has the patient been seen for an appointment in the last year OR does the patient have an upcoming appointment? Yes  Can we respond through MyChart? No, prefers text if possible please  Agent: Please be advised that Rx refills may take up to 3 business days. We ask that you follow-up with your pharmacy.

## 2023-11-14 ENCOUNTER — Encounter: Payer: Self-pay | Admitting: Family Medicine

## 2023-11-14 ENCOUNTER — Ambulatory Visit: Payer: Self-pay

## 2023-11-14 ENCOUNTER — Ambulatory Visit: Admitting: Family Medicine

## 2023-11-14 VITALS — BP 126/78 | HR 86 | Temp 98.4°F | Resp 16 | Ht 59.0 in | Wt 139.2 lb

## 2023-11-14 DIAGNOSIS — M545 Low back pain, unspecified: Secondary | ICD-10-CM | POA: Diagnosis not present

## 2023-11-14 MED ORDER — MELOXICAM 7.5 MG PO TABS
7.5000 mg | ORAL_TABLET | Freq: Every day | ORAL | 0 refills | Status: AC | PRN
Start: 2023-11-14 — End: ?

## 2023-11-14 MED ORDER — TIZANIDINE HCL 4 MG PO TABS: 4.0000 mg | ORAL_TABLET | Freq: Every day | ORAL | 0 refills | Status: AC

## 2023-11-14 NOTE — Patient Instructions (Addendum)
 A few things to remember from today's visit:  Acute right-sided low back pain without sciatica - Plan: meloxicam (MOBIC) 7.5 MG tablet, tiZANidine (ZANAFLEX) 4 MG tablet  Take Zanaflex at bedtime, 1/2-1 tab for 14 days. Stop Ibuprofen and try Meloxicam 1-2 tabs daily as needed. Monitor for new symptoms.  Please arrange a 2-3 weeks follow up with Darleene Shape.  Do not use My Chart to request refills or for acute issues that need immediate attention. If you send a my chart message, it may take a few days to be addressed, specially if I am not in the office.  Please be sure medication list is accurate. If a new problem present, please set up appointment sooner than planned today.

## 2023-11-14 NOTE — Progress Notes (Signed)
 ACUTE VISIT Chief Complaint  Patient presents with   Back Pain    Pt reports back pain for long time, had epidural injection years ago. Had bulging disc. Sx resolved after 6th injection. Recently sx came back, sharp pain this past Thursday. Had been using heating, ibuprofen. Pt wonder if it is muscle spasm.    Discussed the use of AI scribe software for clinical note transcription with the patient, who gave verbal consent to proceed.  History of Present Illness Diana [name] is a 73 year old female with past medical history significant for hypertension, GERD, hypothyroidism, hyperlipidemia, ADHD, and prediabetes here today complaining of lower back pain as described above.  She has been experiencing sharp lower right back pain that began with a couple of sharp pains on Thursday morning. The pain initially subsided after running errands but returned with increased intensity on Friday morning and has been persistent since then.  The pain is described as sharp and located in the lower right back, without radiation to the legs. It is intermittent and exacerbated by certain movements such as leaning forward or braking while driving.  Negative for associated fever, chills, abdominal pain, changes in bowel habits, urinary symptoms, saddle anesthesia, or changes in bowel/bladder function. She maintains an active lifestyle and has not identified any recent physical activity or injury that could have triggered the pain.  Pain does not interfere with sleep.  She sleeps with a pillow between her legs and reports being able to sleep comfortably once she finds a comfortable position.  She has taken ibuprofen twice, which provides temporary relief, but the pain returns once the medication wears off.   Her past medical history includes a bulging disc and lower back pain for which she received epidural injections approximately twenty years ago, resulting in long-term relief until the current  episode.  Review of Systems  Constitutional:  Positive for activity change. Negative for appetite change, chills and unexpected weight change.  HENT:  Negative for sore throat.   Respiratory:  Negative for cough and shortness of breath.   Cardiovascular:  Negative for chest pain and leg swelling.  Gastrointestinal:  Negative for nausea and vomiting.  Genitourinary:  Negative for decreased urine volume, dysuria and hematuria.  Musculoskeletal:  Positive for back pain.  Skin:  Negative for rash.  Neurological:  Negative for syncope, weakness and numbness.  See other pertinent positives and negatives in HPI.  Current Outpatient Medications on File Prior to Visit  Medication Sig Dispense Refill   amphetamine -dextroamphetamine  (ADDERALL) 30 MG tablet Take 1 tablet by mouth daily. 30 tablet 0   amphetamine -dextroamphetamine  (ADDERALL) 30 MG tablet Take 1 tablet by mouth daily. 30 tablet 0   amphetamine -dextroamphetamine  (ADDERALL) 30 MG tablet Take 1 tablet by mouth daily. 30 tablet 0   Ascorbic Acid (VITAMIN C WITH ROSE HIPS) 500 MG tablet Take 500 mg by mouth daily.     Cholecalciferol (VITAMIN D3) 50 MCG (2000 UT) TABS Take 1 capsule by mouth daily.      fenofibrate  (TRICOR ) 145 MG tablet TAKE 1 TABLET BY MOUTH EVERY DAY 90 tablet 3   lisinopril  (ZESTRIL ) 2.5 MG tablet TAKE 1 TABLET BY MOUTH EVERY DAY 90 tablet 1   metFORMIN  (GLUCOPHAGE ) 500 MG tablet Take 1 tablet (500 mg total) by mouth daily with breakfast. 90 tablet 1   Omega-3 Fatty Acids (OMEGA 3 PO) Take 1 capsule by mouth daily.     Simethicone (GAS-X PO) Take by mouth daily.  SYNTHROID  50 MCG tablet Take 1 tablet (50 mcg total) by mouth daily before breakfast. 90 tablet 3   vitamin B-12 (CYANOCOBALAMIN ) 1000 MCG tablet Take 1,000 mcg by mouth daily.     Zinc 30 MG CAPS Take by mouth.     Alum Hydroxide-Mag Carbonate (GAVISCON PO) Take by mouth as needed.      esomeprazole (NEXIUM) 10 MG packet Take 10 mg by mouth daily before  breakfast. (Patient not taking: Reported on 11/14/2023)     Multiple Vitamins-Minerals (ZINC PO) Take 20 mg by mouth daily. (Patient not taking: Reported on 11/14/2023)     Probiotic Product (PROBIOTIC ADVANCED PO) Take 1 capsule by mouth daily. (Patient not taking: Reported on 11/14/2023)     No current facility-administered medications on file prior to visit.    Past Medical History:  Diagnosis Date   ADD (attention deficit disorder)    Cancer (HCC)    skin   Depression    Diverticulosis    DJD (degenerative joint disease)    GERD (gastroesophageal reflux disease)    History of hiatal hernia    Mood disorder    SCC (squamous cell carcinoma) in situ 04/22/2010   right forearm   STD (sexually transmitted disease)    HSV II   Allergies  Allergen Reactions   Ezetimibe     REACTION: statins make joints ache   Latex Hives   Morphine Sulfate     REACTION: n \\T \ v    Social History   Socioeconomic History   Marital status: Married    Spouse name: Not on file   Number of children: 1   Years of education: Not on file   Highest education level: Some college, no degree  Occupational History   Not on file  Tobacco Use   Smoking status: Former    Current packs/day: 0.00    Average packs/day: 1 pack/day for 20.0 years (20.0 ttl pk-yrs)    Types: Cigarettes    Start date: 06/29/1986    Quit date: 06/29/2006    Years since quitting: 17.3   Smokeless tobacco: Never  Vaping Use   Vaping status: Never Used  Substance and Sexual Activity   Alcohol use: No    Alcohol/week: 0.0 standard drinks of alcohol    Comment: occ beer   Drug use: No   Sexual activity: Not Currently    Birth control/protection: Post-menopausal  Other Topics Concern   Not on file  Social History Narrative   Not on file   Social Drivers of Health   Financial Resource Strain: Low Risk  (05/10/2023)   Overall Financial Resource Strain (CARDIA)    Difficulty of Paying Living Expenses: Not hard at all   Food Insecurity: No Food Insecurity (05/10/2023)   Hunger Vital Sign    Worried About Running Out of Food in the Last Year: Never true    Ran Out of Food in the Last Year: Never true  Transportation Needs: No Transportation Needs (05/10/2023)   PRAPARE - Administrator, Civil Service (Medical): No    Lack of Transportation (Non-Medical): No  Physical Activity: Insufficiently Active (05/10/2023)   Exercise Vital Sign    Days of Exercise per Week: 3 days    Minutes of Exercise per Session: 30 min  Stress: Stress Concern Present (05/10/2023)   [employer] of Occupational Health - Occupational Stress Questionnaire    Feeling of Stress : To some extent  Social Connections: Socially Integrated (05/10/2023)   Social  Connection and Isolation Panel    Frequency of Communication with Friends and Family: More than three times a week    Frequency of Social Gatherings with Friends and Family: Twice a week    Attends Religious Services: More than 4 times per year    Active Member of [employer] or Organizations: No    Attends Engineer, Structural: More than 4 times per year    Marital Status: Married   Vitals:   11/14/23 1507  BP: 126/78  Pulse: 86  Resp: 16  Temp: 98.4 F (36.9 C)  SpO2: 97%   Body mass index is 28.11 kg/m.  Physical Exam Vitals and nursing note reviewed.  Constitutional:      General: She is not in acute distress.    Appearance: She is well-developed. She is not ill-appearing.  HENT:     Head: Normocephalic and atraumatic.  Eyes:     Conjunctiva/sclera: Conjunctivae normal.  Cardiovascular:     Rate and Rhythm: Normal rate and regular rhythm.     Heart sounds: No murmur heard.    Comments: PT pulses palpable Pulmonary:     Effort: Pulmonary effort is normal. No respiratory distress.     Breath sounds: Normal breath sounds.  Abdominal:     Palpations: Abdomen is soft. There is no mass.     Tenderness: There is no abdominal tenderness.   Musculoskeletal:     Lumbar back: No tenderness or bony tenderness. Negative right straight leg raise test and negative left straight leg raise test.     Right lower leg: No edema.     Left lower leg: No edema.     Comments: Pain elicited with movement on exam table during examination. No local edema or erythema appreciated, no suspicious lesions.  Skin:    General: Skin is warm.     Findings: No erythema or rash.  Neurological:     General: No focal deficit present.     Mental Status: She is alert and oriented to person, place, and time.     Deep Tendon Reflexes:     Reflex Scores:      Patellar reflexes are 2+ on the right side and 2+ on the left side.    Comments: Non antalgic gait.  Psychiatric:        Mood and Affect: Mood and affect normal.   ASSESSMENT AND PLAN:  Acute right-sided low back pain without sciatica -     Meloxicam; Take 1 tablet (7.5 mg total) by mouth daily as needed for pain.  Dispense: 30 tablet; Refill: 0 -     tiZANidine HCl; Take 1 tablet (4 mg total) by mouth at bedtime for 15 days.  Dispense: 15 tablet; Refill: 0  We discussed possible etiologies. History and examination today do not suggest a serious process.  I think we can hold on imaging for now, she agrees. Because of her history of GERD, recommend changing ibuprofen for meloxicam 7.5 mg 1 to 2 tablets daily as needed. Zanaflex 4 mg 1/2 to 1 tablet daily at bedtime for 14 days recommended. We discussed side effects of medications. Monitor for new symptoms. Instructed about warning signs. Follow-up with PCP in 3 weeks, before if needed.  Return in about 16 days (around 11/30/2023) for back pain with PCP.  Thamar Holik G. Lya Holben, MD  [facility]. [place] office.

## 2023-11-14 NOTE — Telephone Encounter (Signed)
 Patient calling to report lower back pain that started on Friday evening. Endorses the pain is causing discomfort with walking. Pain is a 10. Scheduled for an acute visit today at 3 PM with another provider in office.   FYI Only or Action Required?: FYI only for provider.  Patient was last seen in primary care on 05/10/2023 by Merna Huxley, NP.  Called Nurse Triage reporting Back Pain.  Symptoms began Friday night.  Interventions attempted: OTC medications: ibuprofen, Rest, hydration, or home remedies, and Ice/heat application.  Symptoms are: unchanged.  Triage Disposition: See HCP Within 4 Hours (Or PCP Triage)  Patient/caregiver understands and will follow disposition?: Yes  Copied from CRM [id number]. Topic: Clinical - Red Word Triage >> Nov 14, 2023 10:18 AM Dedra B wrote: Red Word that prompted transfer to Nurse Triage: Pt having sharp pain in lower back that takes her breath away. She said it hurts to walk. Warm transfer to NT. Reason for Disposition  [1] SEVERE back pain (e.g., excruciating, unable to do any normal activities) AND [2] not improved 2 hours after pain medicine  Answer Assessment - Initial Assessment Questions 1. ONSET: When did the pain begin? (e.g., minutes, hours, days)     Started Friday evening 2. LOCATION: Where does it hurt? (upper, mid or lower back)     Lower back  3. SEVERITY: How bad is the pain?  (e.g., Scale 1-10; mild, moderate, or severe)     With movement pain is a 10 4. PATTERN: Is the pain constant? (e.g., yes, no; constant, intermittent)      Intermittent-pain happens when she moves certain ways 5. RADIATION: Does the pain shoot into your legs or somewhere else?     Radiates down her right leg 6. CAUSE:  What do you think is causing the back pain?      unsure 7. BACK OVERUSE:  Any recent lifting of heavy objects, strenuous work or exercise?     no 8. MEDICINES: What have you taken so far for the pain? (e.g., nothing,  acetaminophen , NSAIDS)     ibuprofen 9. NEUROLOGIC SYMPTOMS: Do you have any weakness, numbness, or problems with bowel/bladder control?     no 10. OTHER SYMPTOMS: Do you have any other symptoms? (e.g., fever, abdomen pain, burning with urination, blood in urine)       no  Protocols used: Back Pain-A-AH

## 2023-11-21 ENCOUNTER — Other Ambulatory Visit: Payer: Self-pay | Admitting: Adult Health

## 2023-11-21 DIAGNOSIS — R7303 Prediabetes: Secondary | ICD-10-CM

## 2023-12-06 ENCOUNTER — Encounter: Payer: Self-pay | Admitting: Adult Health

## 2023-12-06 ENCOUNTER — Ambulatory Visit

## 2023-12-06 ENCOUNTER — Ambulatory Visit: Admitting: Adult Health

## 2023-12-06 VITALS — BP 160/86 | HR 86 | Temp 98.1°F | Ht 59.0 in | Wt 136.4 lb

## 2023-12-06 DIAGNOSIS — M545 Low back pain, unspecified: Secondary | ICD-10-CM

## 2023-12-06 DIAGNOSIS — M25511 Pain in right shoulder: Secondary | ICD-10-CM | POA: Diagnosis not present

## 2023-12-06 DIAGNOSIS — M47814 Spondylosis without myelopathy or radiculopathy, thoracic region: Secondary | ICD-10-CM | POA: Diagnosis not present

## 2023-12-06 DIAGNOSIS — M549 Dorsalgia, unspecified: Secondary | ICD-10-CM | POA: Diagnosis not present

## 2023-12-06 NOTE — Progress Notes (Signed)
 Subjective:    Patient ID: Diana [name], female    DOB: 1950/07/23, 73 y.o.   MRN: [id number]  Back Pain  Shoulder Pain       Review of Systems  Musculoskeletal:  Positive for back pain.   See HPI   Past Medical History:  Diagnosis Date   ADD (attention deficit disorder)    Cancer (HCC)    skin   Depression    Diverticulosis    DJD (degenerative joint disease)    GERD (gastroesophageal reflux disease)    History of hiatal hernia    Mood disorder    SCC (squamous cell carcinoma) in situ 04/22/2010   right forearm   STD (sexually transmitted disease)    HSV II    Social History   Socioeconomic History   Marital status: Married    Spouse name: Not on file   Number of children: 1   Years of education: Not on file   Highest education level: Some college, no degree  Occupational History   Not on file  Tobacco Use   Smoking status: Former    Current packs/day: 0.00    Average packs/day: 1 pack/day for 20.0 years (20.0 ttl pk-yrs)    Types: Cigarettes    Start date: 06/29/1986    Quit date: 06/29/2006    Years since quitting: 17.4   Smokeless tobacco: Never  Vaping Use   Vaping status: Never Used  Substance and Sexual Activity   Alcohol use: No    Alcohol/week: 0.0 standard drinks of alcohol    Comment: occ beer   Drug use: No   Sexual activity: Not Currently    Birth control/protection: Post-menopausal  Other Topics Concern   Not on file  Social History Narrative   Not on file   Social Drivers of Health   Financial Resource Strain: Low Risk  (05/10/2023)   Overall Financial Resource Strain (CARDIA)    Difficulty of Paying Living Expenses: Not hard at all  Food Insecurity: No Food Insecurity (05/10/2023)   Hunger Vital Sign    Worried About Running Out of Food in the Last Year: Never true    Ran Out of Food in the Last Year: Never true  Transportation Needs: No Transportation Needs (05/10/2023)   PRAPARE - Administrator, Civil Service  (Medical): No    Lack of Transportation (Non-Medical): No  Physical Activity: Insufficiently Active (05/10/2023)   Exercise Vital Sign    Days of Exercise per Week: 3 days    Minutes of Exercise per Session: 30 min  Stress: Stress Concern Present (05/10/2023)   [employer] of Occupational Health - Occupational Stress Questionnaire    Feeling of Stress : To some extent  Social Connections: Socially Integrated (05/10/2023)   Social Connection and Isolation Panel    Frequency of Communication with Friends and Family: More than three times a week    Frequency of Social Gatherings with Friends and Family: Twice a week    Attends Religious Services: More than 4 times per year    Active Member of [employer] or Organizations: No    Attends Engineer, Structural: More than 4 times per year    Marital Status: Married  Catering Manager Violence: Not At Risk (01/28/2023)   Humiliation, Afraid, Rape, and Kick questionnaire    Fear of Current or Ex-Partner: No    Emotionally Abused: No    Physically Abused: No    Sexually Abused: No    Past  Surgical History:  Procedure Laterality Date   ABDOMINAL HYSTERECTOMY     partial   BREAST IMPLANT REMOVAL     CESAREAN SECTION     COLON SURGERY     had about 1 foot of bowel removed    HEMORRHOID SURGERY     LIPOMA EXCISION N/A 10/23/2014   Procedure: EXCISION OF BACK LIPOMA;  Surgeon: Donnice Lima, MD;  Location: [facility] OR;  Service: General;  Laterality: N/A;   PARTIAL COLECTOMY     PARTIAL HYSTERECTOMY     still has ovaries    PILONIDAL CYST EXCISION     PLACEMENT OF BREAST IMPLANTS     RHINOPLASTY     x 2   SHOULDER SURGERY Right    06    Family History  Problem Relation Age of Onset   Melanoma Mother    Kidney cancer Father    Diabetes Father    Cancer Maternal Grandmother        possible uterine?    Breast cancer Paternal Grandmother    Colon cancer Neg Hx    Esophageal cancer Neg Hx    Pancreatic cancer Neg Hx    Stomach  cancer Neg Hx     Allergies  Allergen Reactions   Ezetimibe     REACTION: statins make joints ache   Latex Hives   Morphine Sulfate     REACTION: n \\T \ v    Current Outpatient Medications on File Prior to Visit  Medication Sig Dispense Refill   Alum Hydroxide-Mag Carbonate (GAVISCON PO) Take by mouth as needed.      amphetamine -dextroamphetamine  (ADDERALL) 30 MG tablet Take 1 tablet by mouth daily. 30 tablet 0   amphetamine -dextroamphetamine  (ADDERALL) 30 MG tablet Take 1 tablet by mouth daily. 30 tablet 0   amphetamine -dextroamphetamine  (ADDERALL) 30 MG tablet Take 1 tablet by mouth daily. 30 tablet 0   Ascorbic Acid (VITAMIN C WITH ROSE HIPS) 500 MG tablet Take 500 mg by mouth daily.     Cholecalciferol (VITAMIN D3) 50 MCG (2000 UT) TABS Take 1 capsule by mouth daily.      fenofibrate  (TRICOR ) 145 MG tablet TAKE 1 TABLET BY MOUTH EVERY DAY 90 tablet 3   lisinopril  (ZESTRIL ) 2.5 MG tablet TAKE 1 TABLET BY MOUTH EVERY DAY 90 tablet 1   meloxicam (MOBIC) 7.5 MG tablet Take 1 tablet (7.5 mg total) by mouth daily as needed for pain. 30 tablet 0   metFORMIN  (GLUCOPHAGE ) 500 MG tablet TAKE 1 TABLET BY MOUTH EVERY DAY WITH BREAKFAST 90 tablet 1   Omega-3 Fatty Acids (OMEGA 3 PO) Take 1 capsule by mouth daily.     Simethicone (GAS-X PO) Take by mouth daily.     SYNTHROID  50 MCG tablet Take 1 tablet (50 mcg total) by mouth daily before breakfast. 90 tablet 3   vitamin B-12 (CYANOCOBALAMIN ) 1000 MCG tablet Take 1,000 mcg by mouth daily.     Zinc 30 MG CAPS Take by mouth.     esomeprazole (NEXIUM) 10 MG packet Take 10 mg by mouth daily before breakfast. (Patient not taking: Reported on 12/06/2023)     Multiple Vitamins-Minerals (ZINC PO) Take 20 mg by mouth daily. (Patient not taking: Reported on 12/06/2023)     Probiotic Product (PROBIOTIC ADVANCED PO) Take 1 capsule by mouth daily. (Patient not taking: Reported on 12/06/2023)     No current facility-administered medications on file prior to  visit.    BP (!) 160/86   Pulse 86   Temp 98.1  F (36.7 C)   Ht 4' 11 (1.499 m)   Wt 136 lb 6.4 oz (61.9 kg)   SpO2 99%   BMI 27.55 kg/m       Objective:   Physical Exam Vitals and nursing note reviewed.  Constitutional:      Appearance: Normal appearance.  Cardiovascular:     Rate and Rhythm: Normal rate and regular rhythm.     Pulses: Normal pulses.     Heart sounds: Normal heart sounds.  Musculoskeletal:        General: Normal range of motion.       Back:  Skin:    General: Skin is warm and dry.  Neurological:     General: No focal deficit present.     Mental Status: She is alert and oriented to person, place, and time.  Psychiatric:        Mood and Affect: Mood normal.        Behavior: Behavior normal.        Thought Content: Thought content normal.        Judgment: Judgment normal.        Assessment & Plan:  1. Acute right-sided low back pain without sciatica - Resolved  2. Mid back pain (Primary) - She was tenderness with palpation under her right shoulder blade. No masses noted. Will get xray today to look at that area.  - Consider advanced imaging  - DG Thoracic Spine 2 View; Future  Darleene Shape, NP  I personally spent a total of 34 minutes in the care of the patient today including preparing to see the patient, getting/reviewing separately obtained history, performing a medically appropriate exam/evaluation, counseling and educating, placing orders, documenting clinical information in the EHR, and listening .

## 2023-12-08 ENCOUNTER — Telehealth: Payer: Self-pay | Admitting: *Deleted

## 2023-12-08 NOTE — Telephone Encounter (Signed)
 Copied from CRM #[id number]. Topic: Clinical - Lab/Test Results >> Dec 08, 2023  4:50 PM Shereese L wrote: Reason for CRM: Patient is needing a nurse to call her in reference to lab results

## 2023-12-13 ENCOUNTER — Ambulatory Visit: Admitting: Adult Health

## 2023-12-13 ENCOUNTER — Encounter: Payer: Self-pay | Admitting: Adult Health

## 2023-12-13 VITALS — BP 136/60 | HR 76 | Temp 98.7°F | Ht 59.0 in | Wt 136.0 lb

## 2023-12-13 DIAGNOSIS — L0291 Cutaneous abscess, unspecified: Secondary | ICD-10-CM

## 2023-12-13 MED ORDER — DOXYCYCLINE HYCLATE 100 MG PO CAPS: 100.0000 mg | ORAL_CAPSULE | Freq: Two times a day (BID) | ORAL | 0 refills | Status: AC

## 2023-12-13 NOTE — Progress Notes (Signed)
 Subjective:    Patient ID: Diana [name] Fees, female    DOB: 07/05/1950, 73 y.o.   MRN: [id number]  HPI 73 year old female who  has a past medical history of ADD (attention deficit disorder), Cancer (HCC), Depression, Diverticulosis, DJD (degenerative joint disease), GERD (gastroesophageal reflux disease), History of hiatal hernia, Mood disorder, SCC (squamous cell carcinoma) in situ (04/22/2010), and STD (sexually transmitted disease).  She presents to the clinic today for an acute issue. She has an abscess on her left mid back. She has not noticed any drainage.    Review of Systems See HPI   Past Medical History:  Diagnosis Date   ADD (attention deficit disorder)    Cancer (HCC)    skin   Depression    Diverticulosis    DJD (degenerative joint disease)    GERD (gastroesophageal reflux disease)    History of hiatal hernia    Mood disorder    SCC (squamous cell carcinoma) in situ 04/22/2010   right forearm   STD (sexually transmitted disease)    HSV II    Social History   Socioeconomic History   Marital status: Married    Spouse name: Not on file   Number of children: 1   Years of education: Not on file   Highest education level: Some college, no degree  Occupational History   Not on file  Tobacco Use   Smoking status: Former    Current packs/day: 0.00    Average packs/day: 1 pack/day for 20.0 years (20.0 ttl pk-yrs)    Types: Cigarettes    Start date: 06/29/1986    Quit date: 06/29/2006    Years since quitting: 17.4   Smokeless tobacco: Never  Vaping Use   Vaping status: Never Used  Substance and Sexual Activity   Alcohol use: No    Alcohol/week: 0.0 standard drinks of alcohol    Comment: occ beer   Drug use: No   Sexual activity: Not Currently    Birth control/protection: Post-menopausal  Other Topics Concern   Not on file  Social History Narrative   Not on file   Social Drivers of Health   Financial Resource Strain: Low Risk  (05/10/2023)   Overall  Financial Resource Strain (CARDIA)    Difficulty of Paying Living Expenses: Not hard at all  Food Insecurity: No Food Insecurity (05/10/2023)   Hunger Vital Sign    Worried About Running Out of Food in the Last Year: Never true    Ran Out of Food in the Last Year: Never true  Transportation Needs: No Transportation Needs (05/10/2023)   PRAPARE - Administrator, Civil Service (Medical): No    Lack of Transportation (Non-Medical): No  Physical Activity: Insufficiently Active (05/10/2023)   Exercise Vital Sign    Days of Exercise per Week: 3 days    Minutes of Exercise per Session: 30 min  Stress: Stress Concern Present (05/10/2023)   [employer] of Occupational Health - Occupational Stress Questionnaire    Feeling of Stress : To some extent  Social Connections: Socially Integrated (05/10/2023)   Social Connection and Isolation Panel    Frequency of Communication with Friends and Family: More than three times a week    Frequency of Social Gatherings with Friends and Family: Twice a week    Attends Religious Services: More than 4 times per year    Active Member of [employer] or Organizations: No    Attends Engineer, Structural: More than 4  times per year    Marital Status: Married  Catering Manager Violence: Not At Risk (01/28/2023)   Humiliation, Afraid, Rape, and Kick questionnaire    Fear of Current or Ex-Partner: No    Emotionally Abused: No    Physically Abused: No    Sexually Abused: No    Past Surgical History:  Procedure Laterality Date   ABDOMINAL HYSTERECTOMY     partial   BREAST IMPLANT REMOVAL     CESAREAN SECTION     COLON SURGERY     had about 1 foot of bowel removed    HEMORRHOID SURGERY     LIPOMA EXCISION N/A 10/23/2014   Procedure: EXCISION OF BACK LIPOMA;  Surgeon: Donnice Lima, MD;  Location: [facility] OR;  Service: General;  Laterality: N/A;   PARTIAL COLECTOMY     PARTIAL HYSTERECTOMY     still has ovaries    PILONIDAL CYST EXCISION      PLACEMENT OF BREAST IMPLANTS     RHINOPLASTY     x 2   SHOULDER SURGERY Right    06    Family History  Problem Relation Age of Onset   Melanoma Mother    Kidney cancer Father    Diabetes Father    Cancer Maternal Grandmother        possible uterine?    Breast cancer Paternal Grandmother    Colon cancer Neg Hx    Esophageal cancer Neg Hx    Pancreatic cancer Neg Hx    Stomach cancer Neg Hx     Allergies  Allergen Reactions   Ezetimibe     REACTION: statins make joints ache   Latex Hives   Morphine Sulfate     REACTION: n \\T \ v    Current Outpatient Medications on File Prior to Visit  Medication Sig Dispense Refill   Alum Hydroxide-Mag Carbonate (GAVISCON PO) Take by mouth as needed.      amphetamine -dextroamphetamine  (ADDERALL) 30 MG tablet Take 1 tablet by mouth daily. 30 tablet 0   amphetamine -dextroamphetamine  (ADDERALL) 30 MG tablet Take 1 tablet by mouth daily. 30 tablet 0   amphetamine -dextroamphetamine  (ADDERALL) 30 MG tablet Take 1 tablet by mouth daily. 30 tablet 0   Ascorbic Acid (VITAMIN C WITH ROSE HIPS) 500 MG tablet Take 500 mg by mouth daily.     Cholecalciferol (VITAMIN D3) 50 MCG (2000 UT) TABS Take 1 capsule by mouth daily.      esomeprazole (NEXIUM) 10 MG packet Take 10 mg by mouth daily before breakfast.     fenofibrate  (TRICOR ) 145 MG tablet TAKE 1 TABLET BY MOUTH EVERY DAY 90 tablet 3   lisinopril  (ZESTRIL ) 2.5 MG tablet TAKE 1 TABLET BY MOUTH EVERY DAY 90 tablet 1   meloxicam  (MOBIC ) 7.5 MG tablet Take 1 tablet (7.5 mg total) by mouth daily as needed for pain. 30 tablet 0   metFORMIN  (GLUCOPHAGE ) 500 MG tablet TAKE 1 TABLET BY MOUTH EVERY DAY WITH BREAKFAST 90 tablet 1   Multiple Vitamins-Minerals (ZINC PO) Take 20 mg by mouth daily.     Omega-3 Fatty Acids (OMEGA 3 PO) Take 1 capsule by mouth daily.     Probiotic Product (PROBIOTIC ADVANCED PO) Take 1 capsule by mouth daily.     Simethicone (GAS-X PO) Take by mouth daily.     SYNTHROID  50 MCG  tablet Take 1 tablet (50 mcg total) by mouth daily before breakfast. 90 tablet 3   vitamin B-12 (CYANOCOBALAMIN ) 1000 MCG tablet Take 1,000 mcg by mouth daily.  Zinc 30 MG CAPS Take by mouth.     No current facility-administered medications on file prior to visit.    BP 136/60   Pulse 76   Temp 98.7 F (37.1 C)   Ht 4' 11 (1.499 m)   Wt 136 lb (61.7 kg)   SpO2 99%   BMI 27.47 kg/m       Objective:   Physical Exam Vitals and nursing note reviewed.  Constitutional:      Appearance: Normal appearance.  Skin:    General: Skin is warm and dry.     Capillary Refill: Capillary refill takes less than 2 seconds.     Findings: Abscess present.      Neurological:     General: No focal deficit present.     Mental Status: She is alert and oriented to person, place, and time.  Psychiatric:        Mood and Affect: Mood normal.        Behavior: Behavior normal.        Thought Content: Thought content normal.        Judgment: Judgment normal.        Assessment & Plan:   1. Abscess (Primary) Procedure:  Incision and drainage of abscess Risks, benefits, and alternatives explained and consent obtained. Time out conducted. Surface cleaned with alcohol. 1 cc lidocaine  with epinephine infiltrated around abscess. Adequate anesthesia ensured. Area prepped and draped in a sterile fashion. #11 blade used to make a stab incision into abscess. Pus expressed with pressure. Curved hemostat used to explore 4 quadrants and loculations broken up. Further purulence expressed.  Approximately 0.5  inches of iodoform packing placed leaving a 1-inch tail. Hemostasis achieved. Pt stable. Aftercare and follow-up advised.  - doxycycline  (VIBRAMYCIN ) 100 MG capsule; Take 1 capsule (100 mg total) by mouth 2 (two) times daily.  Dispense: 14 capsule; Refill: 0  Darleene Shape, NP

## 2023-12-13 NOTE — Telephone Encounter (Signed)
 Spoke to patient and reviewed lab results.  Patient states that  there is a weird sensation near her right shoulder.  It is sharp at times and feels full.  Feels like there is something in there.  Patient has an appointment 12/13/23 and will discuss with provider at that time.

## 2024-01-05 ENCOUNTER — Other Ambulatory Visit: Payer: Self-pay | Admitting: Adult Health

## 2024-01-05 DIAGNOSIS — F902 Attention-deficit hyperactivity disorder, combined type: Secondary | ICD-10-CM

## 2024-01-05 MED ORDER — AMPHETAMINE-DEXTROAMPHETAMINE 30 MG PO TABS: 30.0000 mg | ORAL_TABLET | Freq: Every day | ORAL | 0 refills | Status: DC

## 2024-01-05 NOTE — Telephone Encounter (Signed)
 Copied from CRM #[id number]. Topic: Clinical - Medication Refill >> Jan 05, 2024  9:47 AM Vena HERO wrote: Medication: amphetamine -dextroamphetamine  (ADDERALL) 30 MG tablet Pt going out of town 12/23 and only has enough meds until Sunday  Has the patient contacted their pharmacy? Yes (Agent: If no, request that the patient contact the pharmacy for the refill. If patient does not wish to contact the pharmacy document the reason why and proceed with request.) (Agent: If yes, when and what did the pharmacy advise?) Call provider  This is the patient's preferred pharmacy:  [id number] [address] [facility] - [address] [place] [address] Phone: [phone] Fax: [phone]   Is this the correct pharmacy for this prescription? Yes If no, delete pharmacy and type the correct one.   Has the prescription been filled recently? No  Is the patient out of the medication? No  Has the patient been seen for an appointment in the last year OR does the patient have an upcoming appointment? Yes  Can we respond through MyChart? No, phone call preferred  Agent: Please be advised that Rx refills may take up to 3 business days. We ask that you follow-up with your pharmacy.

## 2024-01-05 NOTE — Telephone Encounter (Signed)
 Okay for refill?

## 2024-02-07 ENCOUNTER — Other Ambulatory Visit: Payer: Self-pay | Admitting: Adult Health

## 2024-02-07 ENCOUNTER — Telehealth: Payer: Self-pay

## 2024-02-07 DIAGNOSIS — F902 Attention-deficit hyperactivity disorder, combined type: Secondary | ICD-10-CM

## 2024-02-07 MED ORDER — AMPHETAMINE-DEXTROAMPHETAMINE 30 MG PO TABS: 30.0000 mg | ORAL_TABLET | Freq: Every day | ORAL | 0 refills | Status: AC

## 2024-02-07 NOTE — Telephone Encounter (Signed)
 Okay for refill?

## 2024-02-07 NOTE — Telephone Encounter (Signed)
 Copied from CRM #[id number]. Topic: Clinical - Medication Refill >> Feb 07, 2024  9:48 AM Hadassah PARAS wrote: Medication: amphetamine -dextroamphetamine  (ADDERALL) 30 MG tablet   Has the patient contacted their pharmacy? Yes (Agent: If no, request that the patient contact the pharmacy for the refill. If patient does not wish to contact the pharmacy document the reason why and proceed with request.) (Agent: If yes, when and what did the pharmacy advise?)  This is the patient's preferred pharmacy:  [id number] [address] [facility] - [address] [place] [address] Phone: [phone] Fax: [phone]   Is this the correct pharmacy for this prescription? Yes If no, delete pharmacy and type the correct one.   Has the prescription been filled recently? Yes  Is the patient out of the medication? Yes  Has the patient been seen for an appointment in the last year OR does the patient have an upcoming appointment? Yes  Can we respond through MyChart? Yes  Agent: Please be advised that Rx refills may take up to 3 business days. We ask that you follow-up with your pharmacy.

## 2024-02-18 ENCOUNTER — Other Ambulatory Visit: Payer: Self-pay | Admitting: Adult Health

## 2024-02-18 DIAGNOSIS — I10 Essential (primary) hypertension: Secondary | ICD-10-CM

## 2024-02-18 DIAGNOSIS — E7800 Pure hypercholesterolemia, unspecified: Secondary | ICD-10-CM

## 2024-04-19 ENCOUNTER — Ambulatory Visit
